# Patient Record
Sex: Female | Born: 1935 | Race: White | Hispanic: No | Smoking: Former smoker
Health system: Southern US, Community
[De-identification: ages and names within clinical notes are randomized; demographics above are authoritative.]

## PROBLEM LIST (undated history)

## (undated) DIAGNOSIS — C801 Malignant (primary) neoplasm, unspecified: Secondary | ICD-10-CM

## (undated) DIAGNOSIS — M1990 Unspecified osteoarthritis, unspecified site: Secondary | ICD-10-CM

## (undated) DIAGNOSIS — F32A Depression, unspecified: Secondary | ICD-10-CM

## (undated) DIAGNOSIS — E785 Hyperlipidemia, unspecified: Secondary | ICD-10-CM

## (undated) DIAGNOSIS — F419 Anxiety disorder, unspecified: Secondary | ICD-10-CM

## (undated) DIAGNOSIS — B019 Varicella without complication: Secondary | ICD-10-CM

## (undated) DIAGNOSIS — M8580 Other specified disorders of bone density and structure, unspecified site: Secondary | ICD-10-CM

## (undated) DIAGNOSIS — H409 Unspecified glaucoma: Secondary | ICD-10-CM

## (undated) DIAGNOSIS — I10 Essential (primary) hypertension: Secondary | ICD-10-CM

## (undated) DIAGNOSIS — F329 Major depressive disorder, single episode, unspecified: Secondary | ICD-10-CM

## (undated) HISTORY — PX: TONSILLECTOMY AND ADENOIDECTOMY: SUR1326

## (undated) HISTORY — PX: SPINE SURGERY: SHX786

## (undated) HISTORY — DX: Other specified disorders of bone density and structure, unspecified site: M85.80

## (undated) HISTORY — DX: Varicella without complication: B01.9

## (undated) HISTORY — DX: Major depressive disorder, single episode, unspecified: F32.9

## (undated) HISTORY — DX: Unspecified osteoarthritis, unspecified site: M19.90

## (undated) HISTORY — PX: BREAST SURGERY: SHX581

## (undated) HISTORY — PX: OTHER SURGICAL HISTORY: SHX169

## (undated) HISTORY — DX: Hyperlipidemia, unspecified: E78.5

## (undated) HISTORY — DX: Essential (primary) hypertension: I10

## (undated) HISTORY — DX: Depression, unspecified: F32.A

## (undated) HISTORY — DX: Malignant (primary) neoplasm, unspecified: C80.1

---

## 1997-10-01 ENCOUNTER — Ambulatory Visit (HOSPITAL_COMMUNITY): Admission: RE | Admit: 1997-10-01 | Discharge: 1997-10-01 | Payer: Self-pay | Admitting: Specialist

## 1998-05-06 ENCOUNTER — Other Ambulatory Visit: Admission: RE | Admit: 1998-05-06 | Discharge: 1998-05-06 | Payer: Self-pay | Admitting: Obstetrics and Gynecology

## 1999-05-21 ENCOUNTER — Other Ambulatory Visit: Admission: RE | Admit: 1999-05-21 | Discharge: 1999-05-21 | Payer: Self-pay | Admitting: Obstetrics and Gynecology

## 1999-09-02 ENCOUNTER — Ambulatory Visit (HOSPITAL_COMMUNITY): Admission: RE | Admit: 1999-09-02 | Discharge: 1999-09-02 | Payer: Self-pay | Admitting: Orthopedic Surgery

## 1999-09-02 ENCOUNTER — Encounter: Payer: Self-pay | Admitting: Orthopedic Surgery

## 1999-09-16 ENCOUNTER — Ambulatory Visit (HOSPITAL_COMMUNITY): Admission: RE | Admit: 1999-09-16 | Discharge: 1999-09-16 | Payer: Self-pay | Admitting: Orthopedic Surgery

## 1999-09-16 ENCOUNTER — Encounter: Payer: Self-pay | Admitting: Orthopedic Surgery

## 1999-09-30 ENCOUNTER — Encounter: Payer: Self-pay | Admitting: Orthopedic Surgery

## 1999-09-30 ENCOUNTER — Ambulatory Visit (HOSPITAL_COMMUNITY): Admission: RE | Admit: 1999-09-30 | Discharge: 1999-09-30 | Payer: Self-pay | Admitting: Orthopedic Surgery

## 2000-05-22 ENCOUNTER — Other Ambulatory Visit: Admission: RE | Admit: 2000-05-22 | Discharge: 2000-05-22 | Payer: Self-pay | Admitting: Obstetrics and Gynecology

## 2001-03-13 ENCOUNTER — Encounter: Payer: Self-pay | Admitting: Orthopaedic Surgery

## 2001-03-15 ENCOUNTER — Ambulatory Visit (HOSPITAL_COMMUNITY): Admission: RE | Admit: 2001-03-15 | Discharge: 2001-03-15 | Payer: Self-pay | Admitting: Orthopaedic Surgery

## 2001-03-15 ENCOUNTER — Encounter: Payer: Self-pay | Admitting: Orthopaedic Surgery

## 2001-03-19 ENCOUNTER — Inpatient Hospital Stay (HOSPITAL_COMMUNITY): Admission: RE | Admit: 2001-03-19 | Discharge: 2001-03-23 | Payer: Self-pay | Admitting: Orthopaedic Surgery

## 2001-03-19 ENCOUNTER — Encounter: Payer: Self-pay | Admitting: Orthopaedic Surgery

## 2001-05-23 ENCOUNTER — Other Ambulatory Visit: Admission: RE | Admit: 2001-05-23 | Discharge: 2001-05-23 | Payer: Self-pay | Admitting: Obstetrics and Gynecology

## 2002-07-26 ENCOUNTER — Other Ambulatory Visit: Admission: RE | Admit: 2002-07-26 | Discharge: 2002-07-26 | Payer: Self-pay | Admitting: Obstetrics and Gynecology

## 2002-11-22 ENCOUNTER — Ambulatory Visit (HOSPITAL_COMMUNITY): Admission: RE | Admit: 2002-11-22 | Discharge: 2002-11-22 | Payer: Self-pay | Admitting: Gastroenterology

## 2003-03-26 ENCOUNTER — Encounter: Admission: RE | Admit: 2003-03-26 | Discharge: 2003-03-26 | Payer: Self-pay | Admitting: Orthopedic Surgery

## 2003-04-18 ENCOUNTER — Observation Stay (HOSPITAL_COMMUNITY): Admission: RE | Admit: 2003-04-18 | Discharge: 2003-04-19 | Payer: Self-pay | Admitting: Orthopedic Surgery

## 2004-09-06 ENCOUNTER — Other Ambulatory Visit: Admission: RE | Admit: 2004-09-06 | Discharge: 2004-09-06 | Payer: Self-pay | Admitting: Obstetrics and Gynecology

## 2006-10-19 ENCOUNTER — Other Ambulatory Visit: Admission: RE | Admit: 2006-10-19 | Discharge: 2006-10-19 | Payer: Self-pay | Admitting: Obstetrics and Gynecology

## 2007-10-23 ENCOUNTER — Other Ambulatory Visit: Admission: RE | Admit: 2007-10-23 | Discharge: 2007-10-23 | Payer: Self-pay | Admitting: Obstetrics and Gynecology

## 2007-11-26 ENCOUNTER — Encounter: Admission: RE | Admit: 2007-11-26 | Discharge: 2007-11-26 | Payer: Self-pay | Admitting: General Surgery

## 2007-11-28 ENCOUNTER — Encounter (HOSPITAL_BASED_OUTPATIENT_CLINIC_OR_DEPARTMENT_OTHER): Payer: Self-pay | Admitting: General Surgery

## 2007-11-28 ENCOUNTER — Ambulatory Visit (HOSPITAL_BASED_OUTPATIENT_CLINIC_OR_DEPARTMENT_OTHER): Admission: RE | Admit: 2007-11-28 | Discharge: 2007-11-28 | Payer: Self-pay | Admitting: General Surgery

## 2007-12-19 ENCOUNTER — Ambulatory Visit: Payer: Self-pay | Admitting: Oncology

## 2008-01-08 ENCOUNTER — Ambulatory Visit: Admission: RE | Admit: 2008-01-08 | Discharge: 2008-01-15 | Payer: Self-pay | Admitting: Radiation Oncology

## 2008-01-09 LAB — CBC WITH DIFFERENTIAL/PLATELET
Basophils Absolute: 0 10*3/uL (ref 0.0–0.1)
EOS%: 0.8 % (ref 0.0–7.0)
HCT: 36.3 % (ref 34.8–46.6)
HGB: 12.4 g/dL (ref 11.6–15.9)
MCHC: 34.1 g/dL (ref 32.0–36.0)
MONO#: 0.5 10*3/uL (ref 0.1–0.9)
Platelets: 196 10*3/uL (ref 145–400)
RBC: 3.77 10*6/uL (ref 3.70–5.32)
RDW: 14 % (ref 11.3–14.5)
WBC: 8.2 10*3/uL (ref 3.9–10.0)
lymph#: 1.2 10*3/uL (ref 0.9–3.3)

## 2008-01-10 LAB — COMPREHENSIVE METABOLIC PANEL
BUN: 28 mg/dL — ABNORMAL HIGH (ref 6–23)
Sodium: 141 mEq/L (ref 135–145)

## 2008-05-07 ENCOUNTER — Ambulatory Visit: Payer: Self-pay | Admitting: Oncology

## 2008-05-09 LAB — CBC WITH DIFFERENTIAL/PLATELET
Basophils Absolute: 0 10*3/uL (ref 0.0–0.1)
Eosinophils Absolute: 0.1 10*3/uL (ref 0.0–0.5)
LYMPH%: 13.5 % — ABNORMAL LOW (ref 14.0–48.0)
MCV: 96.6 fL (ref 81.0–101.0)
NEUT%: 80.4 % — ABNORMAL HIGH (ref 39.6–76.8)
Platelets: 205 10*3/uL (ref 145–400)

## 2008-05-09 LAB — COMPREHENSIVE METABOLIC PANEL
ALT: 10 U/L (ref 0–35)
AST: 13 U/L (ref 0–37)
BUN: 25 mg/dL — ABNORMAL HIGH (ref 6–23)
CO2: 27 mEq/L (ref 19–32)
Calcium: 9 mg/dL (ref 8.4–10.5)
Chloride: 105 mEq/L (ref 96–112)
Creatinine, Ser: 1.04 mg/dL (ref 0.40–1.20)
Glucose, Bld: 125 mg/dL — ABNORMAL HIGH (ref 70–99)
Potassium: 4.3 mEq/L (ref 3.5–5.3)
Total Bilirubin: 0.5 mg/dL (ref 0.3–1.2)
Total Protein: 6.1 g/dL (ref 6.0–8.3)

## 2008-05-09 LAB — LACTATE DEHYDROGENASE: LDH: 151 U/L (ref 94–250)

## 2008-05-09 LAB — CANCER ANTIGEN 27.29: CA 27.29: 19 U/mL (ref 0–39)

## 2008-10-23 ENCOUNTER — Ambulatory Visit: Payer: Self-pay | Admitting: Obstetrics and Gynecology

## 2008-11-26 ENCOUNTER — Ambulatory Visit: Payer: Self-pay | Admitting: Oncology

## 2008-11-28 LAB — CBC WITH DIFFERENTIAL/PLATELET
Basophils Absolute: 0 10*3/uL (ref 0.0–0.1)
EOS%: 0.8 % (ref 0.0–7.0)
Eosinophils Absolute: 0.1 10*3/uL (ref 0.0–0.5)
HCT: 34.8 % (ref 34.8–46.6)
HGB: 11.8 g/dL (ref 11.6–15.9)
MCHC: 33.9 g/dL (ref 31.5–36.0)
MCV: 94.8 fL (ref 79.5–101.0)
MONO#: 0.5 10*3/uL (ref 0.1–0.9)
Platelets: 187 10*3/uL (ref 145–400)
WBC: 8.1 10*3/uL (ref 3.9–10.3)
lymph#: 1.4 10*3/uL (ref 0.9–3.3)

## 2008-12-01 LAB — COMPREHENSIVE METABOLIC PANEL
ALT: 15 U/L (ref 0–35)
AST: 17 U/L (ref 0–37)
Albumin: 3.9 g/dL (ref 3.5–5.2)
Creatinine, Ser: 1.12 mg/dL (ref 0.40–1.20)
Potassium: 4.5 mEq/L (ref 3.5–5.3)
Sodium: 142 mEq/L (ref 135–145)
Total Protein: 6 g/dL (ref 6.0–8.3)

## 2009-02-21 ENCOUNTER — Encounter: Admission: RE | Admit: 2009-02-21 | Discharge: 2009-02-21 | Payer: Self-pay | Admitting: Orthopedic Surgery

## 2009-04-22 ENCOUNTER — Ambulatory Visit (HOSPITAL_COMMUNITY): Admission: RE | Admit: 2009-04-22 | Discharge: 2009-04-23 | Payer: Self-pay | Admitting: Orthopedic Surgery

## 2009-05-02 HISTORY — PX: OTHER SURGICAL HISTORY: SHX169

## 2009-06-19 ENCOUNTER — Ambulatory Visit: Payer: Self-pay | Admitting: Oncology

## 2009-06-23 LAB — CANCER ANTIGEN 27.29: CA 27.29: 22 U/mL (ref 0–39)

## 2009-06-23 LAB — CBC WITH DIFFERENTIAL/PLATELET
BASO%: 0.3 % (ref 0.0–2.0)
EOS%: 1 % (ref 0.0–7.0)
Eosinophils Absolute: 0.1 10*3/uL (ref 0.0–0.5)
HCT: 35 % (ref 34.8–46.6)
LYMPH%: 19.9 % (ref 14.0–49.7)
MCV: 93.2 fL (ref 79.5–101.0)
RBC: 3.75 10*6/uL (ref 3.70–5.45)

## 2009-06-23 LAB — COMPREHENSIVE METABOLIC PANEL
AST: 12 U/L (ref 0–37)
Albumin: 4 g/dL (ref 3.5–5.2)
Alkaline Phosphatase: 52 U/L (ref 39–117)
Chloride: 104 mEq/L (ref 96–112)
Creatinine, Ser: 1.12 mg/dL (ref 0.40–1.20)
Total Bilirubin: 0.3 mg/dL (ref 0.3–1.2)

## 2009-06-23 LAB — LACTATE DEHYDROGENASE: LDH: 136 U/L (ref 94–250)

## 2009-06-23 LAB — VITAMIN D 25 HYDROXY (VIT D DEFICIENCY, FRACTURES): Vit D, 25-Hydroxy: 35 ng/mL (ref 30–89)

## 2009-08-14 ENCOUNTER — Encounter: Admission: RE | Admit: 2009-08-14 | Discharge: 2009-08-14 | Payer: Self-pay | Admitting: Internal Medicine

## 2009-11-05 ENCOUNTER — Ambulatory Visit: Payer: Self-pay | Admitting: Obstetrics and Gynecology

## 2009-11-05 ENCOUNTER — Other Ambulatory Visit: Admission: RE | Admit: 2009-11-05 | Discharge: 2009-11-05 | Payer: Self-pay | Admitting: Obstetrics and Gynecology

## 2009-12-23 ENCOUNTER — Ambulatory Visit: Payer: Self-pay | Admitting: Oncology

## 2009-12-25 LAB — CBC WITH DIFFERENTIAL/PLATELET
BASO%: 0.3 % (ref 0.0–2.0)
Eosinophils Absolute: 0.1 10*3/uL (ref 0.0–0.5)
HGB: 11.4 g/dL — ABNORMAL LOW (ref 11.6–15.9)
LYMPH%: 22.6 % (ref 14.0–49.7)
MCHC: 32.7 g/dL (ref 31.5–36.0)
MCV: 91.4 fL (ref 79.5–101.0)
MONO#: 0.5 10*3/uL (ref 0.1–0.9)
MONO%: 5.8 % (ref 0.0–14.0)
NEUT#: 5.6 10*3/uL (ref 1.5–6.5)
Platelets: 203 10*3/uL (ref 145–400)
RDW: 13.7 % (ref 11.2–14.5)

## 2009-12-25 LAB — COMPREHENSIVE METABOLIC PANEL
ALT: 19 U/L (ref 0–35)
AST: 18 U/L (ref 0–37)
Albumin: 4.1 g/dL (ref 3.5–5.2)
Alkaline Phosphatase: 66 U/L (ref 39–117)
BUN: 30 mg/dL — ABNORMAL HIGH (ref 6–23)
CO2: 27 mEq/L (ref 19–32)
Creatinine, Ser: 1.01 mg/dL (ref 0.40–1.20)
Glucose, Bld: 98 mg/dL (ref 70–99)
Potassium: 4.7 mEq/L (ref 3.5–5.3)
Sodium: 140 mEq/L (ref 135–145)

## 2009-12-25 LAB — CANCER ANTIGEN 27.29: CA 27.29: 15 U/mL (ref 0–39)

## 2009-12-25 LAB — VITAMIN D 25 HYDROXY (VIT D DEFICIENCY, FRACTURES): Vit D, 25-Hydroxy: 42 ng/mL (ref 30–89)

## 2009-12-25 LAB — LACTATE DEHYDROGENASE: LDH: 140 U/L (ref 94–250)

## 2010-08-02 LAB — CBC
MCV: 95.8 fL (ref 78.0–100.0)
RDW: 13.1 % (ref 11.5–15.5)
WBC: 9.1 10*3/uL (ref 4.0–10.5)

## 2010-08-02 LAB — BASIC METABOLIC PANEL
Creatinine, Ser: 0.98 mg/dL (ref 0.4–1.2)
GFR calc Af Amer: >60 mL/min (ref >60)
GFR calc non Af Amer: 54 mL/min — ABNORMAL LOW (ref >60)
Potassium: 4.5 mEq/L (ref 3.5–5.1)

## 2010-08-25 ENCOUNTER — Encounter (HOSPITAL_BASED_OUTPATIENT_CLINIC_OR_DEPARTMENT_OTHER): Payer: Medicare Other | Admitting: Oncology

## 2010-08-25 ENCOUNTER — Other Ambulatory Visit: Payer: Self-pay | Admitting: Oncology

## 2010-08-25 DIAGNOSIS — C50919 Malignant neoplasm of unspecified site of unspecified female breast: Secondary | ICD-10-CM

## 2010-08-25 DIAGNOSIS — I10 Essential (primary) hypertension: Secondary | ICD-10-CM

## 2010-08-25 DIAGNOSIS — C50419 Malignant neoplasm of upper-outer quadrant of unspecified female breast: Secondary | ICD-10-CM

## 2010-08-25 DIAGNOSIS — Z170 Estrogen receptor positive status [ER+]: Secondary | ICD-10-CM

## 2010-08-25 DIAGNOSIS — M810 Age-related osteoporosis without current pathological fracture: Secondary | ICD-10-CM

## 2010-08-25 LAB — CBC WITH DIFFERENTIAL/PLATELET
Eosinophils Absolute: 0.1 10*3/uL (ref 0.0–0.5)
HCT: 35.5 % (ref 34.8–46.6)
LYMPH%: 19.5 % (ref 14.0–49.7)
MCH: 30.7 pg (ref 25.1–34.0)
MCHC: 33.4 g/dL (ref 31.5–36.0)
MCV: 91.8 fL (ref 79.5–101.0)
MONO#: 0.5 10*3/uL (ref 0.1–0.9)
MONO%: 7.1 % (ref 0.0–14.0)
Platelets: 179 10*3/uL (ref 145–400)
RDW: 14.4 % (ref 11.2–14.5)

## 2010-08-26 LAB — COMPREHENSIVE METABOLIC PANEL
Albumin: 4 g/dL (ref 3.5–5.2)
BUN: 37 mg/dL — ABNORMAL HIGH (ref 6–23)
Creatinine, Ser: 1.11 mg/dL (ref 0.40–1.20)
Potassium: 4.8 mEq/L (ref 3.5–5.3)

## 2010-08-26 LAB — VITAMIN D 25 HYDROXY (VIT D DEFICIENCY, FRACTURES): Vit D, 25-Hydroxy: 53 ng/mL (ref 30–89)

## 2010-09-14 NOTE — Op Note (Signed)
Stephanie Dickerson, Stephanie Dickerson           ACCOUNT NO.:  [id number]   MEDICAL RECORD NO.:  [id number]          PATIENT TYPE:  AMB   LOCATION:  DSC                          FACILITY:  [facility]   PHYSICIAN:  Leonie Man, M.D.   DATE OF BIRTH:  11-Jun-1923   DATE OF PROCEDURE:  11/28/2007  DATE OF DISCHARGE:                               OPERATIVE REPORT   PREOPERATIVE DIAGNOSIS:  Carcinoma of the right breast.   POSTOPERATIVE DIAGNOSIS:  Carcinoma of the right breast.   PROCEDURE:  1. Blue dye injection.  2. Right-sided lumpectomy.  3. Right-sided sentinel lymph node biopsy.   SURGEON:  Leonie Man, MD   ASSISTANT:  OR technician.   ANESTHESIA:  General.   SPECIMENS:  1. Right-sided breast tissue for specimen mammography.  2. Sentinel lymph nodes x3.   FINDINGS:  Lymph nodes negative for metastases and the right breast  tissue forwarded was adequate for having contained the clip and the  localizing wire.   Stephanie Dickerson is an 77 year old lady who on screening mammography was  noted to have a lesion in the right breast which on core biopsy shows  invasive carcinoma.  She comes now to the operating room after the risks  and potential benefits of surgery have been discussed.  She has agreed  to lumpectomy and sentinel lymph node biopsy with the possibility of  axillary lymph node dissection.   Following induction of satisfactory anesthesia, I injected approximately  4 mL of dilated methylene blue onto this areolar region and massaged  this into the lymphatics.  The breast was then prepped and draped to be  included in a sterile operative field.  Positive identification of the  patient as Stephanie Dickerson and the operation to be done as lumpectomy  with sentinel lymph node biopsy, possibility of axillary lymph node  dissection was carried out.  All further safety requisitions by  identifying the right side of the patient was done.   The lump in the right breast was approached  through an upper outer  quadrant incision deepened through the skin and subcutaneous tissues and  following the localizing needle down to the chest wall, the entire area  of breast tissue was removed.  Specimen mammography showed both the clip  and pin to be in good position within the center of the specimen.  The  incision was then checked for hemostasis and packed with two cotton  gauze sponges.  I then turned the attention to the axilla.  With the use  of the NeoProbe, we found the most active radioactive side and made a  nice incision there deepening this through the skin and subcutaneous  tissue using the NeoProbe to guide [place] to the region of these sentinel  lymph nodes in the axilla.  The 3 nodes were formed but none of these  were colored blue.  All were forwarded for pathologic diagnosis and felt  that they were benign.   I then reinspected the breast wound.  It was seen to be dry.  Sponge and  instrument counts were verified and the breast wound was closed in  layers using 2-0  Vicryl, 3-0 Vicryl, and 5-0 Monocryl on the skin.  Attention now turned to the axillary wound.  This was inspected and seen  to be dry and this was closed with interrupted 3-0 Vicryl sutures.  The  skin was then closed with 5-0 Monocryl.  Sterile dressings were applied.  The anesthetic reversed and the patient removed from the operating room  to the recovery room in stable condition.  He tolerated the procedure  well.      Leonie Man, M.D.  Electronically Signed     PB/MEDQ  D:  11/28/2007  T:  11/29/2007  Job:  [id number]

## 2010-09-17 NOTE — H&P (Signed)
[facility]  Patient:    Stephanie Dickerson, Stephanie Dickerson. Visit Number: [id number] MRN: [id number]          Service Type: Attending:  Patricia Nettle, M.D. Dictated by:   Druscilla Brownie. Underwood III, P.A.-C. Adm. Date:  03/19/01   CC:         Barbette Hair. Vaughan Basta., M.D. at [facility], [address], [address] [place], [place]  [address]   History and Physical  DATE OF BIRTH:  12-Aug-1923  CHIEF COMPLAINT:  Pain in my back and right leg.  HISTORY OF PRESENT ILLNESS:  This is a 76 year old lady who had been seen by [place] for continuing progressive problems concerning her low back and right lower extremity.  This had been quite disabling.  She has extreme difficulty getting about due to this discomfort.  She has had to use Duragesic patch 75 mcg for her discomfort.  Examination reveals difficulty in heel walking.  She has 4+/5 strength in the right EHL.  Sensation is intact.  She has a positive straight leg raising on the right with transmission to right buttock pain. MRI on February 01, 2001, showed a grade 1 spondylolisthesis with foraminal stenosis at that level.  The 4/5 level is the main area of discopathy and pathology.  Due to the fact that strong medication is needed for her overall difficulty, and the fact that she is not responding to conservative care, felt she would benefit from surgical intervention, and is admitted for L4-5 laminectomy with L4-5 posterior spinal fusion with pedicle screws.  Cell Saver will be used.  She was to have donated 2 units of autologous blood, however, when they tried to draw the blood, it began to clot, and they were unable to get both units.  She has no blood available.  She has cleared preoperatively by Dr. Ike Bene of [facility] at [facility] for this surgery.  After surgery, she will probably go home with home physical therapy, nurse, and/or bath aide.  PAST MEDICAL  HISTORY:  This lady has really been in good health throughout her life time.  She does have hyperlipidemia and she is lactose intolerant.  PAST SURGICAL HISTORY:  Have been acute mastoiditis surgery in 1933.  She has had three vaginal pregnancies.  ALLERGIES:  No medical allergies, but she is lactose intolerant.  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  This is being treated by Dr. Londell Moh.  SOCIAL HISTORY:  The patient is married, neither smokes nor drinks.  FAMILY HISTORY:  Positive for bone cancer in her mother and leukemia in her father.  REVIEW OF SYSTEMS:  CNS:  No seizures, _______ paralysis, numbness, or double vision.  RESPIRATORY:  No productive cough.  No hemoptysis.  No shortness of breath.  CARDIOVASCULAR:  No chest pain.  No angina and no orthopnea. GASTROINTESTINAL:  No nausea, vomiting, melena, or bloody stools.  He has occasional dyspepsia with certain foods.  GENITOURINARY:  No discharge, dysuria, or hematuria.  MUSCULOSKELETAL:  Primarily in Present Illness as mentioned above.  PHYSICAL EXAMINATION:  GENERAL:  Alert, cooperative, and friendly 77 year old, somewhat apprehensive and anxious white female,  and she is accompanied by her husband.  VITAL SIGNS:  Blood pressure 150/70, pulse 72, respirations 12.  HEENT:  Normocephalic.  PERRLA.  Oropharynx is clear.  CHEST:  Clear to auscultation.  No rhonchi and no rales.  HEART:  Regular rate and rhythm.  No murmurs are heard.  ABDOMEN:  Soft and nontender, obese.  Liver and spleen not felt.  GENITALIA:  Not done and not pertinent to Present Illness.  RECTAL:  Not done and not pertinent to Present Illness.  PELVIC:  Not done and not  pertinent to Present Illness.  BREASTS:  Not done and not pertinent to Present Illness.  EXTREMITIES:  As in Present Illness above.  ADMITTING DIAGNOSES: 1. L4 degenerative spondylolisthesis with spinal stenosis. 2. Hyperlipidemia.  PLAN:  The patient will undergo L4-5 laminectomy with posterior spinal fusion and with pedicle screws.  We mentioned the rehabilitation program.  She certainly could be a candidate for this depending her postoperative activities and recovery.  Otherwise, we plan home physical therapy.  Should we have any medical problems, we will certainly contact Dr. Merril Abbe to follow along with [place]. Dictated by:   Druscilla Brownie. Underwood III, P.A.-C. Attending:  Patricia Nettle, M.D. DD:  03/13/01 TD:  03/14/01 Job: [id number]

## 2010-09-17 NOTE — Discharge Summary (Signed)
[facility]  Patient:    Stephanie Dickerson, Stephanie Dickerson Visit Number: [id number] MRN: [id number]          Service Type: SUR Location: 4W 0447 01 Attending Physician:  Patricia Nettle Dictated by:   Ralene Bathe, P.A.-C. Admit Date:  03/19/2001 Discharge Date: 03/23/2001                             Discharge Summary  ADMITTING DIAGNOSES: 1. Spondylolisthesis and spinal stenosis lumbar vertebrae-4. 2. Hyperlipidemia. 3. Anxiety disorder. 4. Chronic pain.  DISCHARGE DIAGNOSES: 1. Spondylolisthesis and spinal stenosis lumbar vertebrae-4. 2. Hyperlipidemia. 3. Anxiety disorder. 4. Chronic pain. 5. Status post lumbar vertebrae-3 through lumbar vertebrae-5 laminectomy and    posterior spinal fusion with pedicle screws at lumbar vertebrae-4 and    lumbar vertebrae-5. 6. Postoperative hemorrhagic anemia requiring transfusion without sequelae. 7. Postoperative exacerbation of anxiety as she was not on her appropriate    medications.  BRIEF HISTORY:  Ms. Cogan is a 77 year old female well known to our practice.  She has spondylolisthesis at L4 as well as spinal stenosis and has had significant pain especially effecting the left L4 nerve root.  Findings is she has failed to require posterior lateral fusion.  Risks and benefits were discussed with the patient at length and she wished to proceed.  She had been on chronic pain medicines including Duragesic patch preoperatively just for control of pain.  The patient wished to proceed.  HOSPITAL COURSE:  The patient underwent the above-mentioned procedure and tolerated this well.  All appropriate IV antibiotics and analgesics were utilized.  Postoperatively, she did experience hemorrhagic anemia requiring transfusion with good result.  She did have good relief of her leg pain.  Pain control was a little bit of an issue initially due to her frequent narcotic use preoperatively; however, we began having good  results and replaced back on her Duragesic patch along with her Ultram prior to her discharge with good pain control.  The patient did unfortunately on postoperative day two and three have an anxiety attack as her medications had not been reordered per her schedule. This was adjusted and she had good control at that time.  A LSO brace was ordered postoperatively.  She was placed in.  PT and OT were consulted.  Her GI status progressed nicely.  By date April 22, 2001, postoperative day four, she had met all of her discharge goals.  She was afebrile, vital signs were stable, and neurovascularly she was intact.  She was controlled on her Duragesic and Ultram.  At this time, she was stable for discharge to home.  LABORATORY DATA:  Admission hemoglobin of 12.0, postoperatively down to 8.0 and 7.0, and after transfusion 8.6 and then at 10.6.  Chemistries also normal. Blood type of A positive.  Three units found transfused in the chart.  X-rays showed a questionable hilar adenopathy.  Intraoperatively, posterior pedicle screws were noted to be in satisfactory appearance.  I did not see EKG on the time of dictation.  CONDITION ON DISCHARGE:  Stable and improved.  DISCHARGE MEDICATIONS:  As planned.  DISCHARGE INSTRUCTIONS:  The patient is being discharged to home with back precautions.  Wear the LSO when up at all times.  Daily dressing change.  May shower.  Resume her home medications which she has Duragesic and Ultram at home.  Do not resume Celebrex.  Use her stool softener p.r.n.  Call for two-week follow-up.  Call for time.  PT and OT at home.  Resume all other home medications. Dictated by:   Ralene Bathe, P.A.-C. Attending Physician:  Patricia Nettle. DD:  04/23/01 TD:  04/24/01 Job: [id number]

## 2010-09-17 NOTE — Op Note (Signed)
[facility]  Patient:    Stephanie Dickerson, Stephanie Dickerson Visit Number: [id number] MRN: [id number]          Service Type: SUR Location: 4W 0447 01 Attending Physician:  Patricia Nettle Proc. Date: 03/19/01 Admit Date:  03/19/2001                             Operative Report  PREOPERATIVE DIAGNOSES: 1. Degenerative spondylolithesis L4-5. 2. Spinal stenosis. 3. Right lower extremity radiculopathy.  POSTOPERATIVE DIAGNOSES: 1. Degenerative spondylolithesis L4-5. 2. Spinal stenosis. 3. Right lower extremity radiculopathy.  PROCEDURE PERFORMED: 1. Decompressive laminectomy L3-4, L4-5, and L5-S1 with lateral    recess and foraminal decompression. 2. Posterolateral fusion L3-4, L4-5, and L5-S1. 3. Pedicle screw instrumentation L4-5 using Stryker via pedicle    screw system. 4. Autogenous local bone graft. 5. Allograft bone graft.  SURGEON:  Patricia Nettle, M.D.  ASSISTANT:  Javier Docker, M.D.  ANESTHESIA:  General endotracheal.  COMPLICATIONS:  None.  INDICATION:  The patient is a 77 year old female with a 1+ year of disabling lower extremity pain.  She has right greater than left leg symptoms.  She is unable to walk more than a block.  She has failed conservative modalities including anti-inflammatories, epidural steroid injections, and physical therapy.  Plain x-rays showed a degenerative L4-5 grade I spondylolisthesis with multilevel spondylitic changes.  An MRI scan was consistent with spinal stenosis most significant at the L4-5 level with right greater than left foraminal stenosis.  The patient has been on a Duragesic 75 mcg as well as Ultram and Celebrex for her pain.  DESCRIPTION OF PROCEDURE:  The patient was properly identified as Stephanie Dickerson in the holding area and taken to the operating room.  She underwent general endotracheal anesthesia without difficulty.  She was given prophylactic IV antibiotics.  She was turned prone onto the  acromed four posterior frame.  All bony prominences were padded.  The back was prepped and draped in the usual sterile fashion.  An 8 cm incision was made from L3 to the sacrum in the midline.  This was carried down to the fascia.  The fascia was incised and the paraspinal muscles stripped out to the tips of the transverse processes.  The sacral ala was exposed.  All soft tissue was cleaned from the spinous processes of L3, 4, 5, and S1.  The spinous processes were removed and morcellized for bone graft.  At this point, a central laminectomy was performed using the Leksell rongeur and Kerrison punches.  There was hypertrophied ligamentum flavum as well as overgrowth of the facet joints most significantly at the L4-5 level.  A lateral recess and foraminal decompression was carried out bilaterally.  There was severe compression of the L4 and 5 nerve root on the right side.  The L4-5 foramen was severely stenotic and a partial facetectomy as well as foraminotomy was required to decompress the exiting L4 nerve root.  The L3, 4, 5, and S1 roots were palpated using a probe bilaterally.  After adequate decompression had been obtained, pedicle screw instrumentation was placed at the L4 and 5 levels using an anatomic pedicle probing technique.  The awl followed by the Steffe pedicle probe followed by the tap and the ball tip probe were utilized sequentially.  On the left side, 40 x 6.5 screws were placed and on the left side 40 x 5.5 mm screws were placed.  The pedicles were palpated from  within the canal and were intact after placement of the screws.  An intraoperative x-ray was taken to confirm appropriate level and placement of the pedicle screws.  At this point, a bilateral posterolateral fusion was carried out by decording the transverse processes and lateral border of the facet and pars interarticularis L3, 4, 5, and the sacrum.  Autogenous local bone graft was packed into the lateral gutters  bilaterally.  Allograft chips were then packed on top of the autogenous graft.  Rods were placed bilaterally and appropriate locking caps inserted.  Gentle distraction was applied across the rods and final tightening was carried out.  The nerve roots were once again palpated.  Hemostasis was achieved.  Gelfoam was placed over the exposed dura.  A deep drain was placed. The deep fascia was closed using #1 running Vicryl sutures.  O Vicryl and 2-0 Vicryl were utilized to close the subcutaneous tissues.  Staples were used to close the skin.  A sterile dressing was applied.  The patient was turned supine and extubated without difficulty.  She was transferred to the recovery room in stable condition able to wiggle her toes. Attending Physician:  Patricia Nettle. DD:  03/19/01 TD:  03/20/01 Job: [id number]

## 2010-09-17 NOTE — Op Note (Signed)
NAME:  Stephanie Dickerson, Stephanie Dickerson                     ACCOUNT NO.:  [id number]   MEDICAL RECORD NO.:  [id number]                   PATIENT TYPE:  OBV   LOCATION:  0474                                 FACILITY:  [facility]   PHYSICIAN:  Marlowe Kays, M.D.               DATE OF BIRTH:  Dec 02, 1923   DATE OF PROCEDURE:  04/18/2003  DATE OF DISCHARGE:                                 OPERATIVE REPORT   PREOPERATIVE DIAGNOSES:  1. Torn lateral meniscus.  2. Osteoarthritis, left knee.   POSTOPERATIVE DIAGNOSES:  1. Torn medial and lateral menisci.  2. Osteoarthritis, left knee.   OPERATION:  Left knee arthroscopy with (1) partial medial and lateral  meniscectomy, (2) shaving of medial femoral condyle.   SURGEON:  Illene Labrador. Aplington, M.D.   ASSISTANT:  Nurse.   ANESTHESIA:  General.   PATHOLOGY AND JUSTIFICATION FOR PROCEDURE:  She has had progressive pain,  swelling in the left knee.  MRI has demonstrated severe tear of the lateral  meniscus associated with degenerative arthritis and also the possibility of  loose bodies in the posterior joint, medial and lateral to the PCL.  At  surgery, she did have some fairly significant maceration of the anterior and  mid portion of the medial meniscus as well as serrated small tears all the  way back to the posterior curve and grade 2/4 chondromalacia of the medial  femoral condyle.  She had complete loss of articular cartilage on both the  lateral femoral condyle and lateral tibial plateau.  It was explained to her  preoperatively that the surgery was not designed to improve her arthritic  problems but hopefully, her pain would be significantly relieved by excision  of the torn lateral meniscus.   DESCRIPTION OF PROCEDURE:  Satisfactory general anesthesia, pneumatic  tourniquet, thigh stabilizer.  Left knee was prepped with DuraPrep and  draped in a sterile field.  Superior and medial saline inflow.  First  through an anterolateral portal, the  medial compartment of the knee joint  was evaluated.  She had a good bit of synovitis which I resected with the  3.5 shaver.  The macerated and torn medial meniscus I dissected off was  smoothed down, and I shaved down also the medial femoral condyle.  Looking  up in the medial gutter and suprapatellar area, there was some mild wear of  the patella but nothing significant.  I then reversed portals.  Her lateral  meniscus was significantly torn throughout its entire extent associated with  the complete loss of the articular cartilage discussed above.  I resected  the torn portion of the inferior horn with scissors, followed by shaving out  with 3.5 shaver.  Posteriorly, the major tear was the intercondylar area  which I debrided back with baskets.  I also debrided back the remaining rim  and shaved everything down with 3.5 shaver until smooth.  I then irrigated  the knee joint until clear  and all fluid possible removed.  The two anterior  portals were closed with 4-0 nylon, and 20 mL of 0.5% Marcaine with  adrenaline and 4 mg of morphine were then instilled through the inflow  apparatus which was removed and this  portal closed with 4-0 nylon as well.  Betadine, Adaptic dry sterile  dressing were applied.  Tourniquet was released.  At the time of this  dictation, she was on her way to the recovery room in satisfactory condition  with no known complications.                                               Marlowe Kays, M.D.    JA/MEDQ  D:  04/18/2003  T:  04/18/2003  Job:  [id number]

## 2010-11-08 ENCOUNTER — Encounter: Payer: Medicare Other | Admitting: Obstetrics and Gynecology

## 2010-11-11 ENCOUNTER — Encounter (INDEPENDENT_AMBULATORY_CARE_PROVIDER_SITE_OTHER): Payer: Medicare Other | Admitting: Obstetrics and Gynecology

## 2010-11-11 DIAGNOSIS — R82998 Other abnormal findings in urine: Secondary | ICD-10-CM

## 2010-11-11 DIAGNOSIS — M899 Disorder of bone, unspecified: Secondary | ICD-10-CM

## 2010-11-11 DIAGNOSIS — N952 Postmenopausal atrophic vaginitis: Secondary | ICD-10-CM

## 2010-11-11 DIAGNOSIS — C50919 Malignant neoplasm of unspecified site of unspecified female breast: Secondary | ICD-10-CM

## 2010-11-23 ENCOUNTER — Encounter: Payer: Self-pay | Admitting: Obstetrics and Gynecology

## 2010-12-08 ENCOUNTER — Telehealth: Payer: Self-pay

## 2010-12-08 MED ORDER — ALENDRONATE SODIUM 70 MG PO TABS: 70.0000 mg | ORAL_TABLET | ORAL | Status: DC

## 2010-12-08 NOTE — Telephone Encounter (Signed)
SEE AEX NOTE 11/11/10. YOU TOLD HER TO CALL YOU BACK AFTER YOU WENT TO YOUR RECENT CONFERENCE.

## 2010-12-08 NOTE — Telephone Encounter (Signed)
Chart please 

## 2010-12-08 NOTE — Telephone Encounter (Signed)
PT. NOTIFIED AND NEEDS RX SENT TO PHARMACY. I TOLD PT I WOULD TAKE CARE OF IT.

## 2010-12-08 NOTE — Telephone Encounter (Signed)
Tell patient based on information at the conference I think she should stay on her Fosamax.

## 2011-01-28 LAB — DIFFERENTIAL
Basophils Absolute: 0
Basophils Relative: 0
Lymphocytes Relative: 15
Monocytes Absolute: 0.4
Monocytes Relative: 5
Neutro Abs: 6.1
Neutrophils Relative %: 78 — ABNORMAL HIGH

## 2011-01-28 LAB — CBC
HCT: 39.7
Hemoglobin: 13
MCV: 96.6
Platelets: 193
RDW: 14.6

## 2011-01-28 LAB — URINALYSIS, ROUTINE W REFLEX MICROSCOPIC
Bilirubin Urine: NEGATIVE
Nitrite: NEGATIVE
Specific Gravity, Urine: 1.011
Urobilinogen, UA: 0.2
pH: 7

## 2011-01-28 LAB — COMPREHENSIVE METABOLIC PANEL
Albumin: 3.8
BUN: 20
Creatinine, Ser: 1.09
Glucose, Bld: 158 — ABNORMAL HIGH
Total Bilirubin: 0.9
Total Protein: 6.2

## 2011-01-28 LAB — URINE MICROSCOPIC-ADD ON

## 2011-03-22 ENCOUNTER — Other Ambulatory Visit: Payer: Self-pay | Admitting: Oncology

## 2011-03-22 ENCOUNTER — Other Ambulatory Visit (HOSPITAL_BASED_OUTPATIENT_CLINIC_OR_DEPARTMENT_OTHER): Payer: Medicare Other

## 2011-03-22 DIAGNOSIS — Z170 Estrogen receptor positive status [ER+]: Secondary | ICD-10-CM

## 2011-03-22 DIAGNOSIS — M810 Age-related osteoporosis without current pathological fracture: Secondary | ICD-10-CM

## 2011-03-22 DIAGNOSIS — M1990 Unspecified osteoarthritis, unspecified site: Secondary | ICD-10-CM

## 2011-03-22 DIAGNOSIS — C50919 Malignant neoplasm of unspecified site of unspecified female breast: Secondary | ICD-10-CM

## 2011-03-22 LAB — CBC WITH DIFFERENTIAL/PLATELET
BASO%: 0.4 % (ref 0.0–2.0)
EOS%: 0.8 % (ref 0.0–7.0)
Eosinophils Absolute: 0.1 10*3/uL (ref 0.0–0.5)
LYMPH%: 18.7 % (ref 14.0–49.7)
MCH: 30.9 pg (ref 25.1–34.0)
MCHC: 33.6 g/dL (ref 31.5–36.0)
MCV: 91.7 fL (ref 79.5–101.0)
MONO%: 6.4 % (ref 0.0–14.0)
NEUT#: 5.6 10*3/uL (ref 1.5–6.5)
Platelets: 198 10*3/uL (ref 145–400)
RBC: 3.92 10*6/uL (ref 3.70–5.45)
RDW: 14.3 % (ref 11.2–14.5)

## 2011-03-23 LAB — COMPREHENSIVE METABOLIC PANEL
AST: 20 U/L (ref 0–37)
Albumin: 4 g/dL (ref 3.5–5.2)
Alkaline Phosphatase: 72 U/L (ref 39–117)
Glucose, Bld: 101 mg/dL — ABNORMAL HIGH (ref 70–99)
Potassium: 4.1 mEq/L (ref 3.5–5.3)
Sodium: 141 mEq/L (ref 135–145)
Total Bilirubin: 0.5 mg/dL (ref 0.3–1.2)
Total Protein: 5.9 g/dL — ABNORMAL LOW (ref 6.0–8.3)

## 2011-03-29 ENCOUNTER — Ambulatory Visit (HOSPITAL_BASED_OUTPATIENT_CLINIC_OR_DEPARTMENT_OTHER): Payer: Medicare Other | Admitting: Oncology

## 2011-03-29 DIAGNOSIS — C50419 Malignant neoplasm of upper-outer quadrant of unspecified female breast: Secondary | ICD-10-CM

## 2011-03-29 DIAGNOSIS — Z170 Estrogen receptor positive status [ER+]: Secondary | ICD-10-CM

## 2011-03-29 DIAGNOSIS — E559 Vitamin D deficiency, unspecified: Secondary | ICD-10-CM

## 2011-03-29 DIAGNOSIS — C50919 Malignant neoplasm of unspecified site of unspecified female breast: Secondary | ICD-10-CM

## 2011-03-29 NOTE — Progress Notes (Signed)
Hematology and Oncology Follow Up Visit  Stephanie Dickerson [id number] May 11, 1923 77 y.o. 03/29/2011 1:02 PM   Principle Diagnosis: Stephanie Dickerson 76 year old woman history of node-negative ER/PR positive breast cancer postlumpectomy July 2009, currently on anastrozole.  Interim History:  Stephanie Dickerson is doing well. She has no complaints. His she had her last mammogram in July. His follow medication well. There have been no other changes in her meds. She had a good holiday season.  Medications: I have reviewed the patient's current medications.  Allergies: No Known Allergies  Past Medical History, Surgical history, Social history, and Family History were reviewed and updated.  Review of Systems: Constitutional:  Negative for fever, chills, night sweats, anorexia, weight loss, pain. Cardiovascular: no chest pain or dyspnea on exertion Respiratory: no cough, shortness of breath, or wheezing Neurological: no TIA or stroke symptoms Dermatological: negative ENT: negative Skin Gastrointestinal: no abdominal pain, change in bowel habits, or black or bloody stools Genito-Urinary: no dysuria, trouble voiding, or hematuria Hematological and Lymphatic: negative Breast: negative for breast lumps Musculoskeletal: negative Remaining ROS negative.  Physical Exam: Blood pressure 122/74, pulse 89, temperature 97.8 F (36.6 C), height 5\' 4"  (1.626 m), weight 189 lb 14.4 oz (86.138 kg). ECOG: 0 General appearance: alert, cooperative, appears stated age and no distress Head: Normocephalic, without obvious abnormality, atraumatic Neck: no adenopathy, no carotid bruit, no JVD, supple, symmetrical, trachea midline and thyroid not enlarged, symmetric, no tenderness/mass/nodules Lymph nodes: Cervical, supraclavicular, and axillary nodes normal. Cardiac : Normal heart sound Pulmonary: Normal breath sounds Breasts: Right and left breasts are free of any masses. There is no nipple retraction or skin  changes. Both of the axilla examined were normal. Abdomen: Benign  Extremities  normal Neuro: Normal  Lab Results: Lab Results  Component Value Date   WBC 7.6 03/22/2011   HGB 12.1 03/22/2011   HCT 35.9 03/22/2011   MCV 91.7 03/22/2011   PLT 198 03/22/2011     Chemistry      Component Value Date/Time   NA 141 03/22/2011 1319   NA 141 03/22/2011 1319   NA 141 03/22/2011 1319   K 4.1 03/22/2011 1319   K 4.1 03/22/2011 1319   K 4.1 03/22/2011 1319   CL 103 03/22/2011 1319   CL 103 03/22/2011 1319   CL 103 03/22/2011 1319   CO2 28 03/22/2011 1319   CO2 28 03/22/2011 1319   CO2 28 03/22/2011 1319   BUN 33* 03/22/2011 1319   BUN 33* 03/22/2011 1319   BUN 33* 03/22/2011 1319   CREATININE 1.23* 03/22/2011 1319   CREATININE 1.23* 03/22/2011 1319   CREATININE 1.23* 03/22/2011 1319      Component Value Date/Time   CALCIUM 9.9 03/22/2011 1319   CALCIUM 9.9 03/22/2011 1319   CALCIUM 9.9 03/22/2011 1319   ALKPHOS 72 03/22/2011 1319   ALKPHOS 72 03/22/2011 1319   ALKPHOS 72 03/22/2011 1319   AST 20 03/22/2011 1319   AST 20 03/22/2011 1319   AST 20 03/22/2011 1319   ALT 14 03/22/2011 1319   ALT 14 03/22/2011 1319   ALT 14 03/22/2011 1319   BILITOT 0.5 03/22/2011 1319   BILITOT 0.5 03/22/2011 1319   BILITOT 0.5 03/22/2011 1319       Radiological Studies: chest X-ray n/a Mammogram n/a Bone density n/a  Impression and Plan: 77 year old woman with history of node-negative hormone receptor positive breast cancer on Arimidex she is doing well without complaints and will be seen in 6 months time with appropriate imaging studies.  More than 50% of the visit was spent in patient-related counselling   Pierce Crane, MD 11/27/20121:02 PM

## 2011-03-30 ENCOUNTER — Telehealth: Payer: Self-pay | Admitting: *Deleted

## 2011-03-30 NOTE — Telephone Encounter (Signed)
gave patient appointment for 08-2011 

## 2011-05-06 DIAGNOSIS — M1710 Unilateral primary osteoarthritis, unspecified knee: Secondary | ICD-10-CM | POA: Diagnosis not present

## 2011-05-06 DIAGNOSIS — M6530 Trigger finger, unspecified finger: Secondary | ICD-10-CM | POA: Diagnosis not present

## 2011-05-06 DIAGNOSIS — M65849 Other synovitis and tenosynovitis, unspecified hand: Secondary | ICD-10-CM | POA: Diagnosis not present

## 2011-05-06 DIAGNOSIS — M79609 Pain in unspecified limb: Secondary | ICD-10-CM | POA: Diagnosis not present

## 2011-05-10 DIAGNOSIS — R634 Abnormal weight loss: Secondary | ICD-10-CM | POA: Diagnosis not present

## 2011-05-10 DIAGNOSIS — M949 Disorder of cartilage, unspecified: Secondary | ICD-10-CM | POA: Diagnosis not present

## 2011-05-10 DIAGNOSIS — M6281 Muscle weakness (generalized): Secondary | ICD-10-CM | POA: Diagnosis not present

## 2011-05-13 DIAGNOSIS — M1710 Unilateral primary osteoarthritis, unspecified knee: Secondary | ICD-10-CM | POA: Diagnosis not present

## 2011-05-17 DIAGNOSIS — I129 Hypertensive chronic kidney disease with stage 1 through stage 4 chronic kidney disease, or unspecified chronic kidney disease: Secondary | ICD-10-CM | POA: Diagnosis not present

## 2011-05-17 DIAGNOSIS — I10 Essential (primary) hypertension: Secondary | ICD-10-CM | POA: Diagnosis not present

## 2011-05-17 DIAGNOSIS — M6281 Muscle weakness (generalized): Secondary | ICD-10-CM | POA: Diagnosis not present

## 2011-06-13 DIAGNOSIS — M545 Low back pain: Secondary | ICD-10-CM | POA: Diagnosis not present

## 2011-06-14 DIAGNOSIS — I10 Essential (primary) hypertension: Secondary | ICD-10-CM | POA: Diagnosis not present

## 2011-06-14 DIAGNOSIS — M6281 Muscle weakness (generalized): Secondary | ICD-10-CM | POA: Diagnosis not present

## 2011-06-22 DIAGNOSIS — M545 Low back pain: Secondary | ICD-10-CM | POA: Diagnosis not present

## 2011-06-27 DIAGNOSIS — M545 Low back pain: Secondary | ICD-10-CM | POA: Diagnosis not present

## 2011-07-01 DIAGNOSIS — M545 Low back pain: Secondary | ICD-10-CM | POA: Diagnosis not present

## 2011-07-05 DIAGNOSIS — M545 Low back pain: Secondary | ICD-10-CM | POA: Diagnosis not present

## 2011-07-08 DIAGNOSIS — M545 Low back pain: Secondary | ICD-10-CM | POA: Diagnosis not present

## 2011-07-12 DIAGNOSIS — M545 Low back pain: Secondary | ICD-10-CM | POA: Diagnosis not present

## 2011-07-18 DIAGNOSIS — M545 Low back pain: Secondary | ICD-10-CM | POA: Diagnosis not present

## 2011-08-03 DIAGNOSIS — F3342 Major depressive disorder, recurrent, in full remission: Secondary | ICD-10-CM | POA: Diagnosis not present

## 2011-08-22 DIAGNOSIS — H353190 Nonexudative age-related macular degeneration, unspecified eye, stage unspecified: Secondary | ICD-10-CM | POA: Diagnosis not present

## 2011-09-27 ENCOUNTER — Telehealth: Payer: Self-pay | Admitting: *Deleted

## 2011-09-27 ENCOUNTER — Ambulatory Visit (HOSPITAL_BASED_OUTPATIENT_CLINIC_OR_DEPARTMENT_OTHER): Payer: Medicare Other | Admitting: Oncology

## 2011-09-27 ENCOUNTER — Other Ambulatory Visit (HOSPITAL_BASED_OUTPATIENT_CLINIC_OR_DEPARTMENT_OTHER): Payer: Medicare Other | Admitting: Lab

## 2011-09-27 VITALS — BP 129/71 | HR 90 | Temp 97.8°F | Ht 64.0 in | Wt 187.5 lb

## 2011-09-27 DIAGNOSIS — Z170 Estrogen receptor positive status [ER+]: Secondary | ICD-10-CM | POA: Diagnosis not present

## 2011-09-27 DIAGNOSIS — C50419 Malignant neoplasm of upper-outer quadrant of unspecified female breast: Secondary | ICD-10-CM | POA: Diagnosis not present

## 2011-09-27 DIAGNOSIS — M810 Age-related osteoporosis without current pathological fracture: Secondary | ICD-10-CM | POA: Diagnosis not present

## 2011-09-27 DIAGNOSIS — E559 Vitamin D deficiency, unspecified: Secondary | ICD-10-CM

## 2011-09-27 DIAGNOSIS — C50919 Malignant neoplasm of unspecified site of unspecified female breast: Secondary | ICD-10-CM

## 2011-09-27 LAB — CBC WITH DIFFERENTIAL/PLATELET
BASO%: 0.5 % (ref 0.0–2.0)
Eosinophils Absolute: 0.1 10*3/uL (ref 0.0–0.5)
HCT: 36.2 % (ref 34.8–46.6)
LYMPH%: 19.2 % (ref 14.0–49.7)
MCHC: 32.7 g/dL (ref 31.5–36.0)
MCV: 93.5 fL (ref 79.5–101.0)
MONO#: 0.5 10*3/uL (ref 0.1–0.9)
MONO%: 6.7 % (ref 0.0–14.0)
NEUT%: 72.5 % (ref 38.4–76.8)
Platelets: 210 10*3/uL (ref 145–400)
WBC: 7.5 10*3/uL (ref 3.9–10.3)

## 2011-09-27 NOTE — Telephone Encounter (Signed)
gave patient appointment for 03-30-2012 starting at 11:30am printed out calendar and gave to the patient 

## 2011-09-27 NOTE — Telephone Encounter (Signed)
gave patient appointment for 03-30-2012 starting at 11:30am printed out calendar and gave to the patient

## 2011-09-27 NOTE — Progress Notes (Signed)
Hematology and Oncology Follow Up Visit  Stephanie Dickerson [id number] [birth year] 78 y.o. 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  Interim History:  Stephanie Dickerson is doing well. She has no complaints.  she is due for a  mammogram in July. She is tolerating her  medication well. There have been no other changes in her meds. She had a good memorial Day.She has migratory aches and pains unrelated to the AI.  Medications: I have reviewed the patient's current medications.  Allergies: No Known Allergies  Past Medical History, Surgical history, Social history, and Family History were reviewed and updated.  Review of Systems: Constitutional:  Negative for fever, chills, night sweats, anorexia, weight loss, pain. Cardiovascular: no chest pain or dyspnea on exertion Respiratory: no cough, shortness of breath, or wheezing Neurological: no TIA or stroke symptoms Dermatological: negative ENT: negative Skin Gastrointestinal: no abdominal pain, change in bowel habits, or black or bloody stools Genito-Urinary: no dysuria, trouble voiding, or hematuria Hematological and Lymphatic: negative Breast: negative for breast lumps Musculoskeletal: negative Remaining ROS negative.  Physical Exam: Blood pressure 129/71, pulse 90, temperature 97.8 F (36.6 C), height 5\' 4"  (1.626 m), weight 187 lb 8 oz (85.049 kg). ECOG: 0 General appearance: alert, cooperative, appears stated age and no distress Head: Normocephalic, without obvious abnormality, atraumatic Neck: no adenopathy, no carotid bruit, no JVD, supple, symmetrical, trachea midline and thyroid not enlarged, symmetric, no tenderness/mass/nodules Lymph nodes: Cervical, supraclavicular, and axillary nodes normal. Cardiac : Normal heart sound Pulmonary: Normal breath sounds Breasts: Right and left breasts are free  of any masses. There is no nipple retraction or skin changes. Both of the axilla examined were normal. Abdomen: Benign  Extremities  normal Neuro: Normal  Lab Results: Lab Results  Component Value Date   WBC 7.5 09/27/2011   HGB 11.8 09/27/2011   HCT 36.2 09/27/2011   MCV 93.5 09/27/2011   PLT 210 09/27/2011     Chemistry      Component Value Date/Time   NA 141 03/22/2011 1319   NA 141 03/22/2011 1319   NA 141 03/22/2011 1319   K 4.1 03/22/2011 1319   K 4.1 03/22/2011 1319   K 4.1 03/22/2011 1319   CL 103 03/22/2011 1319   CL 103 03/22/2011 1319   CL 103 03/22/2011 1319   CO2 28 03/22/2011 1319   CO2 28 03/22/2011 1319   CO2 28 03/22/2011 1319   BUN 33* 03/22/2011 1319   BUN 33* 03/22/2011 1319   BUN 33* 03/22/2011 1319   CREATININE 1.23* 03/22/2011 1319   CREATININE 1.23* 03/22/2011 1319   CREATININE 1.23* 03/22/2011 1319      Component Value Date/Time   CALCIUM 9.9 03/22/2011 1319   CALCIUM 9.9 03/22/2011 1319   CALCIUM 9.9 03/22/2011 1319   ALKPHOS 72 03/22/2011 1319   ALKPHOS 72 03/22/2011 1319   ALKPHOS 72 03/22/2011 1319   AST 20 03/22/2011 1319   AST 20 03/22/2011 1319   AST 20 03/22/2011 1319   ALT 14 03/22/2011 1319   ALT 14 03/22/2011 1319   ALT 14 03/22/2011 1319   BILITOT 0.5 03/22/2011 1319   BILITOT 0.5 03/22/2011 1319   BILITOT 0.5 03/22/2011 1319       Radiological Studies: chest X-ray n/a Mammogram n/a Bone density n/a  Impression and Plan: 78 year old woman with history of node-negative hormone receptor positive breast cancer on Arimidex she is doing well  without complaints and will be seen in 6 months time with appropriate imaging studies.  More than 50% of the visit was spent in patient-related counselling   Pierce Crane, MD 5/28/20132:33 PM

## 2011-09-28 LAB — COMPREHENSIVE METABOLIC PANEL
BUN: 26 mg/dL — ABNORMAL HIGH (ref 6–23)
CO2: 31 mEq/L (ref 19–32)
Calcium: 9.5 mg/dL (ref 8.4–10.5)
Chloride: 104 mEq/L (ref 96–112)
Creatinine, Ser: 1.09 mg/dL (ref 0.50–1.10)
Total Bilirubin: 0.3 mg/dL (ref 0.3–1.2)

## 2011-09-28 LAB — VITAMIN D 25 HYDROXY (VIT D DEFICIENCY, FRACTURES): Vit D, 25-Hydroxy: 50 ng/mL (ref 30–89)

## 2011-10-03 DIAGNOSIS — M1710 Unilateral primary osteoarthritis, unspecified knee: Secondary | ICD-10-CM | POA: Diagnosis not present

## 2011-10-03 DIAGNOSIS — M19019 Primary osteoarthritis, unspecified shoulder: Secondary | ICD-10-CM | POA: Diagnosis not present

## 2011-11-14 DIAGNOSIS — M19019 Primary osteoarthritis, unspecified shoulder: Secondary | ICD-10-CM | POA: Diagnosis not present

## 2011-11-14 DIAGNOSIS — M1710 Unilateral primary osteoarthritis, unspecified knee: Secondary | ICD-10-CM | POA: Diagnosis not present

## 2011-11-17 DIAGNOSIS — Z1231 Encounter for screening mammogram for malignant neoplasm of breast: Secondary | ICD-10-CM | POA: Diagnosis not present

## 2011-11-21 DIAGNOSIS — M1710 Unilateral primary osteoarthritis, unspecified knee: Secondary | ICD-10-CM | POA: Diagnosis not present

## 2011-11-29 DIAGNOSIS — M1710 Unilateral primary osteoarthritis, unspecified knee: Secondary | ICD-10-CM | POA: Diagnosis not present

## 2012-01-20 DIAGNOSIS — M899 Disorder of bone, unspecified: Secondary | ICD-10-CM | POA: Diagnosis not present

## 2012-01-20 DIAGNOSIS — M949 Disorder of cartilage, unspecified: Secondary | ICD-10-CM | POA: Diagnosis not present

## 2012-01-25 ENCOUNTER — Encounter: Payer: Self-pay | Admitting: Obstetrics and Gynecology

## 2012-02-02 ENCOUNTER — Ambulatory Visit (INDEPENDENT_AMBULATORY_CARE_PROVIDER_SITE_OTHER): Payer: Medicare Other | Admitting: Family Medicine

## 2012-02-02 ENCOUNTER — Encounter: Payer: Self-pay | Admitting: Family Medicine

## 2012-02-02 VITALS — BP 138/70 | Temp 98.4°F | Ht 63.0 in | Wt 190.0 lb

## 2012-02-02 DIAGNOSIS — E785 Hyperlipidemia, unspecified: Secondary | ICD-10-CM | POA: Diagnosis not present

## 2012-02-02 DIAGNOSIS — I10 Essential (primary) hypertension: Secondary | ICD-10-CM | POA: Diagnosis not present

## 2012-02-02 DIAGNOSIS — R739 Hyperglycemia, unspecified: Secondary | ICD-10-CM

## 2012-02-02 DIAGNOSIS — K5900 Constipation, unspecified: Secondary | ICD-10-CM | POA: Insufficient documentation

## 2012-02-02 DIAGNOSIS — R7309 Other abnormal glucose: Secondary | ICD-10-CM

## 2012-02-02 DIAGNOSIS — F411 Generalized anxiety disorder: Secondary | ICD-10-CM

## 2012-02-02 DIAGNOSIS — F419 Anxiety disorder, unspecified: Secondary | ICD-10-CM

## 2012-02-02 DIAGNOSIS — C50919 Malignant neoplasm of unspecified site of unspecified female breast: Secondary | ICD-10-CM

## 2012-02-02 DIAGNOSIS — Z23 Encounter for immunization: Secondary | ICD-10-CM | POA: Diagnosis not present

## 2012-02-02 DIAGNOSIS — M1990 Unspecified osteoarthritis, unspecified site: Secondary | ICD-10-CM

## 2012-02-02 HISTORY — DX: Anxiety disorder, unspecified: F41.9

## 2012-02-02 HISTORY — DX: Unspecified osteoarthritis, unspecified site: M19.90

## 2012-02-02 LAB — LIPID PANEL
Cholesterol: 184 mg/dL (ref 0–200)
HDL: 84.3 mg/dL (ref >39.00)

## 2012-02-02 LAB — HEMOGLOBIN A1C: Hgb A1c MFr Bld: 5.9 % (ref 4.6–6.5)

## 2012-02-02 NOTE — Patient Instructions (Signed)
-  We have ordered labs or studies at this visit. It usually takes 1-2 weeks for results and processing. We will contact you with instructions IF your results are abnormal. Normal results will be released to your [facility] in 1-2 weeks. If you have not heard from [place] or can not find your results in [facility] in 2 weeks please contact our office.  Follow up with me in about 4-6 months - schedule appointment on your way out.   Thank you for enrolling in MyChart. Please follow the instructions below to securely access your online medical record. MyChart allows you to send messages to your doctor, view your test results, renew your prescriptions, schedule appointments, and more.  How Do I Sign Up? 1. In your Internet browser, go to [email or web address] 2. Click on the New  User? link in the Sign In box.  3. Enter your MyChart Access Code exactly as it appears below. You will not need to use this code after you have completed the sign-up process. If you do not sign up before the expiration date, you must request a new code. MyChart Access Code: [email or web address] Expires: 03/03/2012  1:33 PM  4. Enter the last four digits of your Social Security Number (xxxx) and Date of Birth (mm/dd/yyyy) as indicated and click Next. You will be taken to the next sign-up page. 5. Create a MyChart ID. This will be your MyChart login ID and cannot be changed, so think of one that is secure and easy to remember. 6. Create a MyChart password. You can change your password at any time. 7. Enter your Password Reset Question and Answer and click Next. This can be used at a later time if you forget your password.  8. Select your communication preference, and if applicable enter your e-mail address. You will receive e-mail notification when new information is available in MyChart by choosing to receive e-mail notifications and filling in your e-mail. 9. Click Sign In. You can now view your medical record.   Additional  Information If you have questions, you can email [email or web address] or call [phone] to talk to our MyChart staff. Remember, MyChart is NOT to be used for urgent needs. For medical emergencies, dial 911.

## 2012-02-02 NOTE — Progress Notes (Addendum)
Chief Complaint  Patient presents with  . Establish Care    HPI:   Stephanie Dickerson is here to establish care.  Her chart was briefly reviewed prior to her visit and of note she has a hx of breast cancer, on anastrazole, followed by Dr. Donnie Coffin whom she is now seeing q6 months. Bone dexa ordered by Dr. Donnie Coffin 01/2012 with recs to continue lifestyle recs and repeat in 2 years.  She had CBC, CMP and Vitamin D labs in 08/2011 which looked good.   HTN: -takes Norvasc and and Losartan -well controlled -denies: CP, SOB, palpitations, swelling, headaches  Constipation: -regular bowel movements -sometimes constipated when doesn't drink enough fluids and uses stool softener  Hyperlipidemia: -on lipitor -stable  Depression/Anxiety/Insomnia: -followed/managed by [facility]/nurse who works with psychiatry -taking celexa, ativan one at night, trazadone, imiprimine -she is not sure why on all of these meds, has been on them for many years and feeling great - no troubles with anxiety or sleep at this time  Osteoarthritis/RTC and back pain: -followed/managed by Dr. Sedonia Small in [facility] -provided celebrex, glucosamine chondroitin -does not take vicodin any more - only uses very rarely when has a bad day maybe once per month  -does not take the robaxin - very rarely once to twice a year uses this  Wants flu today Has had tetanus, pheumo and shingles  Lives with son. No falls, uses cane, doing well.  ROS: See pertinent positives and negatives per HPI.  Past Medical History  Diagnosis Date  . Arthritis   . Cancer   . Chicken pox   . Depression   . Hypertension   . Hyperlipidemia     Family History  Problem Relation Age of Onset  . Lung cancer      husband  . Bone cancer Mother     History   Social History  . Marital Status: Widowed    Spouse Name: N/A    Number of Children: N/A  . Years of Education: N/A   Social History Main Topics  . Smoking status: Former Games developer  .  Smokeless tobacco: None  . Alcohol Use: Yes     per pt a glass of white wine before dinner   . Drug Use: None  . Sexually Active: None   Other Topics Concern  . None   Social History Narrative  . None    Current outpatient prescriptions:amLODipine (NORVASC) 10 MG tablet, Take 10 mg by mouth daily., Disp: , Rfl: ;  anastrozole (ARIMIDEX) 1 MG tablet, Take 1 mg by mouth daily., Disp: , Rfl: ;  aspirin 81 MG tablet, Take 81 mg by mouth daily., Disp: , Rfl: ;  atorvastatin (LIPITOR) 40 MG tablet, Take 40 mg by mouth daily., Disp: , Rfl: ;  calcium carbonate (OS-CAL) 600 MG TABS, Take 600 mg by mouth 2 (two) times daily with a meal., Disp: , Rfl:  Casanthranol-Docusate Sodium 30-100 MG CAPS, Take 2 tablets by mouth., Disp: , Rfl: ;  celecoxib (CELEBREX) 200 MG capsule, Take 200 mg by mouth 2 (two) times daily., Disp: , Rfl: ;  citalopram (CELEXA) 20 MG tablet, , Disp: , Rfl: ;  glucosamine-chondroitin 500-400 MG tablet, Take 1 tablet by mouth 3 (three) times daily., Disp: , Rfl: ;  HYDROcodone-acetaminophen (NORCO/VICODIN) 5-325 MG per tablet, , Disp: , Rfl:  LORazepam (ATIVAN) 1 MG tablet, Take 1 mg by mouth every 8 (eight) hours., Disp: , Rfl: ;  losartan (COZAAR) 100 MG tablet, Take 100 mg by mouth daily.,  Disp: , Rfl: ;  methocarbamol (ROBAXIN) 500 MG tablet, Take 500 mg by mouth 4 (four) times daily., Disp: , Rfl: ;  Multiple Vitamins-Minerals (ICAPS) CAPS, Take 1 capsule by mouth., Disp: , Rfl: ;  traZODone (DESYREL) 100 MG tablet, Take 100 mg by mouth at bedtime., Disp: , Rfl:  imipramine (TOFRANIL) 50 MG tablet, Take 50 mg by mouth at bedtime., Disp: , Rfl:   EXAM:  Filed Vitals:   02/02/12 1255  BP: 138/70  Temp: 98.4 F (36.9 C)    Body mass index is 33.66 kg/(m^2).  GENERAL: vitals reviewed and listed below, alert, oriented, appears well hydrated and in no acute distress  HEENT: atraumatic, conjucntiva clear, no obvious abnormalities on inspection of external nose and  ears  NECK: no masses on inspection  LUNGS: clear to auscultation bilaterally, no wheezes, rales or rhonchi, good air movement  CV: HRRR, SEM II/VIno peripheral edema  MS: moves all extremities without noticeable abnormality  PSYCH: pleasant and cooperative, no obvious depression or anxiety  ASSESSMENT AND PLAN:  Discussed the following assessment and plan:  1. Hyperlipidemia  Lipid Panel  2. Hypertension    3. Hyperglycemia  Hemoglobin A1c  4. Constipation    5. Anxiety    6. Osteoarthritis    7. Hx of Breast cancer - managed by Dr. Donnie Coffin     -doing quite well, very functional and sharp -will check lipids and hgba1c -will send note to psych as on many medications and wonders why with no psych symptoms in may years - if can wean of of some of these may prevent issues with polypharm in the future -utd on preventive care -flu today -reviewed and updated PMH, PSH, FH, SH and Meds -also advised on advanced directives/HCPOA and she will look into this -follow up in 4-6 months  Orders Placed This Encounter  Procedures  . Lipid Panel  . Hemoglobin A1c    Patient Instructions  -We have ordered labs or studies at this visit. It usually takes 1-2 weeks for results and processing. We will contact you with instructions IF your results are abnormal. Normal results will be released to your [facility] in 1-2 weeks. If you have not heard from [place] or can not find your results in [facility] in 2 weeks please contact our office.  Follow up with me in about 4-6 months - schedule appointment on your way out.   Thank you for enrolling in MyChart. Please follow the instructions below to securely access your online medical record. MyChart allows you to send messages to your doctor, view your test results, renew your prescriptions, schedule appointments, and more.  How Do I Sign Up? 1. In your Internet browser, go to [email or web address] 2. Click on the New  User? link in the Sign  In box.  3. Enter your MyChart Access Code exactly as it appears below. You will not need to use this code after you have completed the sign-up process. If you do not sign up before the expiration date, you must request a new code. MyChart Access Code: [email or web address] Expires: 03/03/2012  1:33 PM  4. Enter the last four digits of your Social Security Number (xxxx) and Date of Birth (mm/dd/yyyy) as indicated and click Next. You will be taken to the next sign-up page. 5. Create a MyChart ID. This will be your MyChart login ID and cannot be changed, so think of one that is secure and easy to remember. 6. Create a MyChart password. You can  change your password at any time. 7. Enter your Password Reset Question and Answer and click Next. This can be used at a later time if you forget your password.  8. Select your communication preference, and if applicable enter your e-mail address. You will receive e-mail notification when new information is available in MyChart by choosing to receive e-mail notifications and filling in your e-mail. 9. Click Sign In. You can now view your medical record.   Additional Information If you have questions, you can email [email or web address]  [email or web address] or call [phone] to talk to our MyChart staff. Remember, MyChart is NOT to be used for urgent needs. For medical emergencies, dial 911.             Return to clinic immediately if symptoms worsen or persist or new concerns.  Return in about 4 months (around 06/04/2012) for HTN, HLP.  Stephanie Dickerson R.

## 2012-02-03 NOTE — Progress Notes (Signed)
Quick Note:  Called and spoke with pt and pt is aware. ______ 

## 2012-02-06 ENCOUNTER — Encounter: Payer: Self-pay | Admitting: Family Medicine

## 2012-02-09 DIAGNOSIS — F3342 Major depressive disorder, recurrent, in full remission: Secondary | ICD-10-CM | POA: Diagnosis not present

## 2012-02-16 ENCOUNTER — Encounter: Payer: Self-pay | Admitting: Gynecology

## 2012-02-16 DIAGNOSIS — F32A Depression, unspecified: Secondary | ICD-10-CM | POA: Insufficient documentation

## 2012-02-16 DIAGNOSIS — M8580 Other specified disorders of bone density and structure, unspecified site: Secondary | ICD-10-CM | POA: Insufficient documentation

## 2012-02-16 DIAGNOSIS — C801 Malignant (primary) neoplasm, unspecified: Secondary | ICD-10-CM | POA: Insufficient documentation

## 2012-02-16 DIAGNOSIS — M1990 Unspecified osteoarthritis, unspecified site: Secondary | ICD-10-CM | POA: Insufficient documentation

## 2012-02-16 DIAGNOSIS — F329 Major depressive disorder, single episode, unspecified: Secondary | ICD-10-CM | POA: Insufficient documentation

## 2012-02-20 ENCOUNTER — Encounter: Payer: Self-pay | Admitting: Family Medicine

## 2012-02-20 DIAGNOSIS — H4011X0 Primary open-angle glaucoma, stage unspecified: Secondary | ICD-10-CM | POA: Diagnosis not present

## 2012-02-20 DIAGNOSIS — H353190 Nonexudative age-related macular degeneration, unspecified eye, stage unspecified: Secondary | ICD-10-CM | POA: Diagnosis not present

## 2012-02-20 DIAGNOSIS — H409 Unspecified glaucoma: Secondary | ICD-10-CM | POA: Diagnosis not present

## 2012-02-21 ENCOUNTER — Telehealth: Payer: Self-pay | Admitting: Family Medicine

## 2012-02-21 NOTE — Telephone Encounter (Signed)
Pt would like a pap smear. Can I sch? Pt is 78 years old

## 2012-02-21 NOTE — Telephone Encounter (Signed)
Called and left a message for pt to return call.  Also sent pt a mychart response.  Per Dr. Selena Batten there are no indications that women after age 76 need a pap smear as long as normal screening in the past according to the [employer] manager.

## 2012-02-21 NOTE — Telephone Encounter (Signed)
Called and spoke with pt and pt is aware. Pt states it keeps coming up on chart.  Pt states she understands and is fine with this.

## 2012-02-28 ENCOUNTER — Encounter: Payer: Medicare Other | Admitting: Obstetrics and Gynecology

## 2012-02-29 DIAGNOSIS — L820 Inflamed seborrheic keratosis: Secondary | ICD-10-CM | POA: Diagnosis not present

## 2012-02-29 DIAGNOSIS — L919 Hypertrophic disorder of the skin, unspecified: Secondary | ICD-10-CM | POA: Diagnosis not present

## 2012-02-29 DIAGNOSIS — B078 Other viral warts: Secondary | ICD-10-CM | POA: Diagnosis not present

## 2012-02-29 DIAGNOSIS — B079 Viral wart, unspecified: Secondary | ICD-10-CM | POA: Diagnosis not present

## 2012-03-02 ENCOUNTER — Other Ambulatory Visit: Payer: Self-pay

## 2012-03-02 DIAGNOSIS — C50919 Malignant neoplasm of unspecified site of unspecified female breast: Secondary | ICD-10-CM

## 2012-03-02 DIAGNOSIS — E559 Vitamin D deficiency, unspecified: Secondary | ICD-10-CM

## 2012-03-02 MED ORDER — AMLODIPINE BESYLATE 10 MG PO TABS: 10.0000 mg | ORAL_TABLET | Freq: Every day | ORAL | Status: DC

## 2012-03-02 MED ORDER — ATORVASTATIN CALCIUM 40 MG PO TABS: ORAL_TABLET | ORAL | Status: DC

## 2012-03-02 NOTE — Telephone Encounter (Signed)
Rx request for atrovastatin and amlodipine to [facility].

## 2012-03-06 ENCOUNTER — Other Ambulatory Visit: Payer: Self-pay

## 2012-03-06 DIAGNOSIS — E559 Vitamin D deficiency, unspecified: Secondary | ICD-10-CM

## 2012-03-06 DIAGNOSIS — C50919 Malignant neoplasm of unspecified site of unspecified female breast: Secondary | ICD-10-CM

## 2012-03-06 MED ORDER — LOSARTAN POTASSIUM 100 MG PO TABS: 100.0000 mg | ORAL_TABLET | Freq: Every day | ORAL | Status: DC

## 2012-03-06 NOTE — Telephone Encounter (Signed)
rx for losartan sent to pharmacy.

## 2012-03-19 ENCOUNTER — Other Ambulatory Visit: Payer: Self-pay | Admitting: *Deleted

## 2012-03-19 DIAGNOSIS — C50919 Malignant neoplasm of unspecified site of unspecified female breast: Secondary | ICD-10-CM

## 2012-03-19 DIAGNOSIS — M1710 Unilateral primary osteoarthritis, unspecified knee: Secondary | ICD-10-CM | POA: Diagnosis not present

## 2012-03-19 DIAGNOSIS — E559 Vitamin D deficiency, unspecified: Secondary | ICD-10-CM

## 2012-03-19 MED ORDER — ANASTROZOLE 1 MG PO TABS: 1.0000 mg | ORAL_TABLET | Freq: Every day | ORAL | Status: DC

## 2012-03-26 DIAGNOSIS — H40019 Open angle with borderline findings, low risk, unspecified eye: Secondary | ICD-10-CM | POA: Diagnosis not present

## 2012-03-30 ENCOUNTER — Other Ambulatory Visit: Payer: Medicare Other | Admitting: Lab

## 2012-03-30 ENCOUNTER — Ambulatory Visit: Payer: Medicare Other | Admitting: Oncology

## 2012-05-02 ENCOUNTER — Other Ambulatory Visit: Payer: Self-pay | Admitting: Family Medicine

## 2012-05-03 ENCOUNTER — Other Ambulatory Visit: Payer: Medicare Other | Admitting: Lab

## 2012-05-06 ENCOUNTER — Encounter: Payer: Self-pay | Admitting: Family Medicine

## 2012-05-07 ENCOUNTER — Encounter: Payer: Self-pay | Admitting: Family Medicine

## 2012-05-08 ENCOUNTER — Other Ambulatory Visit: Payer: Self-pay

## 2012-05-08 MED ORDER — ATORVASTATIN CALCIUM 80 MG PO TABS: ORAL_TABLET | ORAL | Status: DC

## 2012-05-08 NOTE — Telephone Encounter (Signed)
Rx sent to the pharmacy for lipitor 80 mg; take 1/2 tablet by mouth daily.

## 2012-05-10 ENCOUNTER — Ambulatory Visit: Payer: Medicare Other | Admitting: Oncology

## 2012-05-15 ENCOUNTER — Other Ambulatory Visit: Payer: Self-pay | Admitting: Oncology

## 2012-05-15 DIAGNOSIS — C50919 Malignant neoplasm of unspecified site of unspecified female breast: Secondary | ICD-10-CM

## 2012-05-26 ENCOUNTER — Other Ambulatory Visit: Payer: Self-pay | Admitting: Oncology

## 2012-06-04 ENCOUNTER — Telehealth: Payer: Self-pay | Admitting: *Deleted

## 2012-06-04 NOTE — Telephone Encounter (Signed)
Left msg for pt to return call to r/s f/u appt. 

## 2012-06-05 ENCOUNTER — Encounter: Payer: Self-pay | Admitting: Oncology

## 2012-06-05 ENCOUNTER — Telehealth: Payer: Self-pay | Admitting: *Deleted

## 2012-06-05 NOTE — Telephone Encounter (Signed)
Pt called and I confirmed 06/19/12 appt w/ pt.  Mailed letter & calendar to pt. 

## 2012-06-19 ENCOUNTER — Ambulatory Visit (HOSPITAL_BASED_OUTPATIENT_CLINIC_OR_DEPARTMENT_OTHER): Payer: Medicare Other | Admitting: Gynecologic Oncology

## 2012-06-19 ENCOUNTER — Encounter: Payer: Self-pay | Admitting: Gynecologic Oncology

## 2012-06-19 ENCOUNTER — Telehealth: Payer: Self-pay | Admitting: Oncology

## 2012-06-19 VITALS — BP 155/72 | HR 74 | Temp 97.6°F | Resp 20 | Ht 63.0 in | Wt 191.6 lb

## 2012-06-19 DIAGNOSIS — C50419 Malignant neoplasm of upper-outer quadrant of unspecified female breast: Secondary | ICD-10-CM | POA: Diagnosis not present

## 2012-06-19 DIAGNOSIS — Z170 Estrogen receptor positive status [ER+]: Secondary | ICD-10-CM

## 2012-06-19 DIAGNOSIS — M8580 Other specified disorders of bone density and structure, unspecified site: Secondary | ICD-10-CM

## 2012-06-19 DIAGNOSIS — M899 Disorder of bone, unspecified: Secondary | ICD-10-CM | POA: Diagnosis not present

## 2012-06-19 DIAGNOSIS — M949 Disorder of cartilage, unspecified: Secondary | ICD-10-CM

## 2012-06-19 DIAGNOSIS — C50919 Malignant neoplasm of unspecified site of unspecified female breast: Secondary | ICD-10-CM

## 2012-06-19 MED ORDER — ALENDRONATE SODIUM 35 MG PO TABS: 35.0000 mg | ORAL_TABLET | ORAL | Status: DC

## 2012-06-19 NOTE — Progress Notes (Signed)
OFFICE PROGRESS NOTE  CC  Stephanie Dickerson., DO [address] [place] [address]  DIAGNOSIS:  Stephanie Dickerson is a 79 year old [place] woman who underwent a screening mammogram on 11/06/2007, which showed a spiculated 1.5 cm isodense mass upper outer quadrant at 10 o'clock position 4 cm from the nipple.  Ultrasound confirmed the presence of a 1.8 cm mass.  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 The mass was low-grade, ER + 99%, PR + at 100%, proliferative index 8%, HER-2 was 1+.  Breast specific gamma imaging performed on 11/07/2007 showed the previously mentioned activity at similar location but the maximum uptake was about 1 cm.    PRIOR THERAPY: The patient elected to undergo a right breast lumpectomy and sentinel lymph node evaluation on 11/28/2007.  Final pathology showed a 1.7 cm invasive ductal carcinoma, grade I of III, DCIS low grade with negative surgical margins.  3 sentinel lymph nodes were all negative as well.  She declined radiation therapy and started Tamoxifen in 01/2008.  She took Tamoxifen for 4 to 5 years and was switched to Arimidex.    CURRENT THERAPY:  Arimidex 1 mg daily.  INTERVAL HISTORY: Stephanie Dickerson 79 y.o. female returns for continued follow up.  She is tolerating Arimidex well with no hot flashes or vaginal dryness.  She reports intermittent joint pain that she relates to arthritis, carpal tunnel in her right wrist, and left ankle fracture in the past.  She has supportive ace bandages on her left ankle and right wrist "for support."  She reports a decrease in mobility due to her arthritis and she uses a cane when walking for support.    MEDICAL HISTORY: Past Medical History  Diagnosis Date  . Arthritis   . Chicken pox   . Hyperlipidemia   . Osteopenia   . Hypertension   . Cancer     Breast cancer-Right  . Depression     ALLERGIES:  has No Known  Allergies.  MEDICATIONS:  Current Outpatient Prescriptions  Medication Sig Dispense Refill  . amLODipine (NORVASC) 10 MG tablet Take 1 tablet (10 mg total) by mouth daily.  90 tablet  0  . anastrozole (ARIMIDEX) 1 MG tablet TAKE 1 TABLET (1 MG TOTAL) BY MOUTH DAILY.  30 tablet  2  . aspirin 81 MG tablet Take 81 mg by mouth daily.      Marland Kitchen atorvastatin (LIPITOR) 80 MG tablet Take 1/2 tablet by mouth once daily.  90 tablet  1  . calcium carbonate (OS-CAL) 600 MG TABS Take 600 mg by mouth 2 (two) times daily with a meal.      . Casanthranol-Docusate Sodium 30-100 MG CAPS Take 2 tablets by mouth.      . celecoxib (CELEBREX) 200 MG capsule Take 200 mg by mouth 2 (two) times daily.      . citalopram (CELEXA) 20 MG tablet       . glucosamine-chondroitin 500-400 MG tablet Take 1 tablet by mouth 3 (three) times daily.      Marland Kitchen HYDROcodone-acetaminophen (NORCO/VICODIN) 5-325 MG per tablet       . imipramine (TOFRANIL) 50 MG tablet Take 50 mg by mouth at bedtime.      Marland Kitchen LORazepam (ATIVAN) 1 MG tablet Take 1 mg by mouth every 8 (eight) hours.      Marland Kitchen losartan (COZAAR) 100 MG tablet Take 1 tablet (100 mg total) by mouth daily.  90  tablet  2  . LUMIGAN 0.01 % SOLN       . methocarbamol (ROBAXIN) 500 MG tablet Take 500 mg by mouth 4 (four) times daily.      . Multiple Vitamins-Minerals (ICAPS) CAPS Take 1 capsule by mouth.      . traZODone (DESYREL) 100 MG tablet Take 100 mg by mouth at bedtime.      Marland Kitchen alendronate (FOSAMAX) 35 MG tablet Take 1 tablet (35 mg total) by mouth every 7 (seven) days. Take with a full glass of water on an empty stomach.  12 tablet  6   No current facility-administered medications for this visit.    SURGICAL HISTORY:  Past Surgical History  Procedure Laterality Date  . Tonsillectomy and adenoidectomy    . Rotator cuff surgery  2011  . Arthroscopy knee surgery      left  . Cataract surgery    . Breast surgery      Lumpectomy  . Spine surgery      REVIEW OF SYSTEMS:   Constitutional: Feels well.  Mildly limited due to arthritis.  Cardiovascular: No chest pain or shortness of breath.  Intermittent bilateral ankle edema that she relates to taking Norvasc.  Pulmonary: No cough or wheeze.  Gastrointestinal: No nausea, vomiting, or diarrhea. No bright red blood per rectum or change in bowel movement.  Genitourinary: No frequency, urgency, or dysuria. No vaginal bleeding or discharge.  Musculoskeletal: No myalgia.  Intermittent joint pain. Neurologic: No weakness, numbness, or change in gait.  Psychology: No depression, anxiety, or insomnia  HEALTH MAINTENANCE: Mammogram:  11/23/11 Colonoscopy:  "Don't want another." Bone Density:  01/20/12 resulting low bone mass Pap Smear:  Managed by Dr. Eda Paschal in the past Eye Exam: Annually and now more frequently due to glaucoma in the right eye Vitamin D: 09/27/11 Lipid Panel: Managed by Dr. Selena Batten  PHYSICAL EXAMINATION: Blood pressure 155/72, pulse 74, temperature 97.6 F (36.4 C), temperature source Oral, resp. rate 20, height 5\' 3"  (1.6 m), weight 191 lb 9.6 oz (86.909 kg). Body mass index is 33.95 kg/(m^2). ECOG PERFORMANCE STATUS: 1 - Symptomatic but completely ambulatory  General: Well developed, well nourished female in no acute distress. Alert and oriented x 3.  Head/Neck: Oropharynx clear.  Sclerae anicteric.  Supple without any enlargements.  Lymph node survey: No cervical, supraclavicular, or axillary adenopathy.  Cardiovascular: Regular rate and rhythm. S1 and S2 normal.  Mild systolic murmur noted.  Lungs: Clear to auscultation bilaterally. No wheezes/crackles/rhonchi noted.  Skin: No rashes or lesions present. Back: No CVA tenderness.  Abdomen: Abdomen soft, non-tender and obese. Active bowel sounds in all quadrants. No evidence of a fluid wave, organomegaly, or abdominal masses.  Breasts:  Right lumpectomy scar noted.  Inspection negative with no nodularity, masses, erythema, or discharge noted  bilaterally. Extremities: No bilateral cyanosis or clubbing.  Mild, non-pitting edema noted bilaterally.   LABORATORY DATA: Lab Results  Component Value Date   WBC 7.5 09/27/2011   HGB 11.8 09/27/2011   HCT 36.2 09/27/2011   MCV 93.5 09/27/2011   PLT 210 09/27/2011      Chemistry      Component Value Date/Time   NA 140 09/27/2011 1331   K 4.7 09/27/2011 1331   CL 104 09/27/2011 1331   CO2 31 09/27/2011 1331   BUN 26* 09/27/2011 1331   CREATININE 1.09 09/27/2011 1331      Component Value Date/Time   CALCIUM 9.5 09/27/2011 1331   ALKPHOS 77 09/27/2011 1331  AST 17 09/27/2011 1331   ALT 15 09/27/2011 1331   BILITOT 0.3 09/27/2011 1331      RADIOGRAPHIC STUDIES:  No results found.  ASSESSMENT: 79 year old [place] woman: #1  S/p right lumpectomy on 11/28/07 for T1c, N0 IDC with DCIS, Stage I, Grade 1, ER+ 99%, PR+ 100%, Ki67 8%, HER2 - 1+.    #2  She declined radiation therapy.  #3  She started Tamoxifen in 01/2008.  She took Tamoxifen for 4 to 5 years and was switched to Arimidex.    PLAN:  She is to begin taking Fosamax once weekly along with Vitamin D3 2000 IU daily and Caltrate with Vitamin D twice daily.  Potential side effects of Fosamax discussed with the patient and Dr. Welton Flakes along with the administration instructions.  She is to follow up with Dr. Welton Flakes in 6 months or sooner if problems arise with lab work prior to that visit.  All questions were answered. The patient knows to call the clinic with any problems, questions or concerns. We can certainly see the patient much sooner if necessary.  I spent 30 minutes counseling the patient face to face and Dr. Welton Flakes spoke with the patient for 10 minutes about future plans and recommendations. The total time spent in the appointment was 60 minutes.  Stephanie Mccreedy, NP Medical/Oncology [facility] [phone] (Office)  06/19/2012, 9:02 PM

## 2012-06-19 NOTE — Patient Instructions (Addendum)
Doing well.  Begin Fosamax once weekly.  Begin taking Vitamin D3 2000 IU daily and take Caltrate with Vit D twice daily.  Plan to follow up with Dr. Welton Flakes in 6 months with lab work at that time.  Please call for any questions or concerns.  Alendronate weekly tablets What is this medicine? ALENDRONATE (a LEN droe nate) slows calcium loss from bones. It helps to make healthy bone and to slow bone loss in people with osteoporosis. It may be used to treat Paget's disease. This medicine may be used for other purposes; ask your health care provider or pharmacist if you have questions. What should I tell my health care provider before I take this medicine? They need to know if you have any of these conditions: -esophagus, stomach, or intestine problems, like acid-reflux or GERD -dental disease -kidney disease -low blood calcium -low vitamin D -problems swallowing -problems sitting or standing for 30 minutes -an unusual or allergic reaction to alendronate, other medicines, foods, dyes, or preservatives -pregnant or trying to get pregnant -breast-feeding How should I use this medicine? You must take this medicine exactly as directed or you will lower the amount of medicine you absorb into your body or you may cause yourself harm. Take your dose by mouth first thing in the morning, after you are up for the day. Do not eat or drink anything before you take this medicine. Swallow your medicine with a 2 full glasses (6 to 8 fluid ounces) of plain water. Do not take this tablet with any other drink. Do not chew or crush the tablet. After taking this medicine, do not eat breakfast, drink, or take any medicines or vitamins for at least 30 minutes. Stand or sit up for at least 30 minutes after you take this medicine; do not lie down. Take this medicine on the same day every week. Do not take your medicine more often than directed. Talk to your pediatrician regarding the use of this medicine in children. Special  care may be needed. Overdosage: If you think you have taken too much of this medicine contact a poison control center or emergency room at once. NOTE: This medicine is only for you. Do not share this medicine with others. What if I miss a dose? If you miss a dose, take the dose on the morning after you remember. Then take your next dose on your regular day of the week. Never take 2 tablets on the same day. Do not take double or extra doses. What may interact with this medicine? -aluminum hydroxide -antacids -aspirin -calcium supplements -drugs for inflammation like ibuprofen, naproxen, and others -iron supplements -magnesium supplements -vitamins with minerals This list may not describe all possible interactions. Give your health care provider a list of all the medicines, herbs, non-prescription drugs, or dietary supplements you use. Also tell them if you smoke, drink alcohol, or use illegal drugs. Some items may interact with your medicine. What should I watch for while using this medicine? Visit your doctor or health care professional for regular checks ups. It may be some time before you see benefit from this medicine. Do not stop taking your medication except on your doctor's advice. Your doctor or health care professional may order blood tests and other tests to see how you are doing. You should make sure you get enough calcium and vitamin D while you are taking this medicine, unless your doctor tells you not to. Discuss the foods you eat and the vitamins you take with  your health care professional. Some people who take this medicine have severe bone, joint, and/or muscle pain. This medicine may also increase your risk for a broken thigh bone. Tell your doctor right away if you have pain in your upper leg or groin. Tell your doctor if you have any pain that does not go away or that gets worse. This medicine can make you more sensitive to the sun. If you get a rash while taking this medicine,  sunlight may cause the rash to get worse. Keep out of the sun. If you cannot avoid being in the sun, wear protective clothing and use sunscreen. Do not use sun lamps or tanning beds/booths. What side effects may I notice from receiving this medicine? Side effects that you should report to your doctor or health care professional as soon as possible: -allergic reactions like skin rash, itching or hives, swelling of the face, lips, or tongue -black or tarry stools -bone, muscle or joint pain -changes in vision -chest pain -heartburn or stomach pain -jaw pain, especially after dental work -pain or trouble when swallowing -redness, blistering, peeling or loosening of the skin, including inside the mouth Side effects that usually do not require medical attention (report to your doctor or health care professional if they continue or are bothersome): -changes in taste -diarrhea or constipation -eye pain or itching -headache -nausea or vomiting -stomach gas or fullness This list may not describe all possible side effects. Call your doctor for medical advice about side effects. You may report side effects to FDA at 1-800-FDA-1088. Where should I keep my medicine? Keep out of the reach of children. Store at room temperature of 15 and 30 degrees C (59 and 86 degrees F). Throw away any unused medicine after the expiration date. NOTE: This sheet is a summary. It may not cover all possible information. If you have questions about this medicine, talk to your doctor, pharmacist, or health care provider.  2013, [employer]. (03/03/2011 9:02:42 AM)

## 2012-06-19 NOTE — Telephone Encounter (Signed)
gv pt appt schedule for August.  °

## 2012-06-22 DIAGNOSIS — M1710 Unilateral primary osteoarthritis, unspecified knee: Secondary | ICD-10-CM | POA: Diagnosis not present

## 2012-06-23 ENCOUNTER — Other Ambulatory Visit: Payer: Self-pay | Admitting: Family Medicine

## 2012-06-29 DIAGNOSIS — M1710 Unilateral primary osteoarthritis, unspecified knee: Secondary | ICD-10-CM | POA: Diagnosis not present

## 2012-07-10 DIAGNOSIS — M1710 Unilateral primary osteoarthritis, unspecified knee: Secondary | ICD-10-CM | POA: Diagnosis not present

## 2012-07-16 ENCOUNTER — Other Ambulatory Visit: Payer: Self-pay | Admitting: Oncology

## 2012-07-25 DIAGNOSIS — H4011X0 Primary open-angle glaucoma, stage unspecified: Secondary | ICD-10-CM | POA: Diagnosis not present

## 2012-07-25 DIAGNOSIS — H409 Unspecified glaucoma: Secondary | ICD-10-CM | POA: Diagnosis not present

## 2012-08-03 ENCOUNTER — Other Ambulatory Visit: Payer: Self-pay | Admitting: Family Medicine

## 2012-08-09 ENCOUNTER — Encounter: Payer: Self-pay | Admitting: Family Medicine

## 2012-08-09 ENCOUNTER — Ambulatory Visit (INDEPENDENT_AMBULATORY_CARE_PROVIDER_SITE_OTHER): Payer: Medicare Other | Admitting: Family Medicine

## 2012-08-09 VITALS — BP 150/76 | HR 85 | Temp 98.3°F | Wt 186.0 lb

## 2012-08-09 DIAGNOSIS — R5381 Other malaise: Secondary | ICD-10-CM | POA: Diagnosis not present

## 2012-08-09 DIAGNOSIS — Z79899 Other long term (current) drug therapy: Secondary | ICD-10-CM | POA: Diagnosis not present

## 2012-08-09 DIAGNOSIS — R5383 Other fatigue: Secondary | ICD-10-CM

## 2012-08-09 NOTE — Progress Notes (Addendum)
Chief Complaint  Patient presents with  . Fatigue    HPI:  78 yo F here for acute visit for fatigue: -new to our practice and I have seen her once -she reports she is doing really well, but has noticed gets tired sometime - has been going on for a long time. Thinks trazadone and her medicine is causing this - take 300mg  nightly - she has tried decreasing it to 250 and already felt much better and wants to derease it further but was not sure if she could -hx of breast cancer on anastrazole followed closely by oncology -patient reports: worsening function since breast cancer with decreased mobility related to medication and followed by onc -also hx mental health problems and on many sedating meds - trazadone, imipramine, celexa, ativan, methocarbamol (rarely)? Sees Jaynie Bream - seeing her sooner  -reports anxiety was bad at one time with life issues, but better now, but continues all these medications -I had concerns for polypharm last visit and left message for her psych provider to call me back, but unfortunately did not here back from that office -Denies: cough, SOB, CP, bowel changes, weight loss, blood in stool, rashes, dysuria, swelling, palpitations -has chronic joint and muscle pains and they worsened from the fosamax  ROS: See pertinent positives and negatives per HPI.  Past Medical History  Diagnosis Date  . Arthritis   . Chicken pox   . Hyperlipidemia   . Osteopenia   . Hypertension   . Cancer     Breast cancer-Right  . Depression     Family History  Problem Relation Age of Onset  . Lung cancer      husband  . Bone cancer Mother   . Leukemia Father     History   Social History  . Marital Status: Widowed    Spouse Name: N/A    Number of Children: N/A  . Years of Education: N/A   Social History Main Topics  . Smoking status: Former Games developer  . Smokeless tobacco: None  . Alcohol Use: Yes     Comment: per pt a glass of white wine before dinner   . Drug Use:  None  . Sexually Active: Yes    Birth Control/ Protection: Post-menopausal   Other Topics Concern  . None   Social History Narrative  . None    Current outpatient prescriptions:alendronate (FOSAMAX) 35 MG tablet, Take 1 tablet (35 mg total) by mouth every 7 (seven) days. Take with a full glass of water on an empty stomach., Disp: 12 tablet, Rfl: 6;  amLODipine (NORVASC) 10 MG tablet, TAKE 1 TABLET (10 MG TOTAL) BY MOUTH DAILY., Disp: 30 tablet, Rfl: 5;  anastrozole (ARIMIDEX) 1 MG tablet, TAKE 1 TABLET (1 MG TOTAL) BY MOUTH DAILY., Disp: 30 tablet, Rfl: 5 aspirin 81 MG tablet, Take 81 mg by mouth daily., Disp: , Rfl: ;  atorvastatin (LIPITOR) 80 MG tablet, TAKE 1/2 TABLET BY MOUTH ONCE DAILY., Disp: 45 tablet, Rfl: 2;  calcium carbonate (OS-CAL) 600 MG TABS, Take 600 mg by mouth 2 (two) times daily with a meal., Disp: , Rfl: ;  Casanthranol-Docusate Sodium 30-100 MG CAPS, Take 2 tablets by mouth., Disp: , Rfl:  celecoxib (CELEBREX) 200 MG capsule, Take 200 mg by mouth 2 (two) times daily., Disp: , Rfl: ;  citalopram (CELEXA) 20 MG tablet, , Disp: , Rfl: ;  glucosamine-chondroitin 500-400 MG tablet, Take 1 tablet by mouth 3 (three) times daily., Disp: , Rfl: ;  imipramine (TOFRANIL)  50 MG tablet, Take 50 mg by mouth at bedtime., Disp: , Rfl: ;  LORazepam (ATIVAN) 1 MG tablet, Take 1 mg by mouth every 8 (eight) hours., Disp: , Rfl:  losartan (COZAAR) 100 MG tablet, Take 1 tablet (100 mg total) by mouth daily., Disp: 90 tablet, Rfl: 2;  LUMIGAN 0.01 % SOLN, , Disp: , Rfl: ;  methocarbamol (ROBAXIN) 500 MG tablet, Take 500 mg by mouth 4 (four) times daily., Disp: , Rfl: ;  Multiple Vitamins-Minerals (ICAPS) CAPS, Take 1 capsule by mouth., Disp: , Rfl: ;  traZODone (DESYREL) 100 MG tablet, Take 100 mg by mouth at bedtime., Disp: , Rfl:   EXAM:  Filed Vitals:   08/09/12 1321  BP: 150/76  Pulse: 85  Temp: 98.3 F (36.8 C)    Body mass index is 32.96 kg/(m^2).  GENERAL: vitals reviewed and listed  above, alert, oriented, appears well hydrated and in no acute distress  HEENT: atraumatic, conjunttiva clear, no obvious abnormalities on inspection of external nose and ears  NECK: no obvious masses on inspection  LUNGS: clear to auscultation bilaterally, no wheezes, rales or rhonchi, good air movement  CV: HRRR, no peripheral edema  MS: moves all extremities without noticeable abnormality  PSYCH: pleasant and cooperative, no obvious depression or anxiety  ASSESSMENT AND PLAN:  Discussed the following assessment and plan:  Polypharmacy  Fatigue  -fatigue that improved when she decreased her trazadone, she wants to come off some of her psych meds and I think this is a good plan -have advised a taper off of the trazadone and advised her to discuss tapering off some of her other sedating pscyh meds with her psychiatrist (imipramine, ativan, ? Celexa) -advised and offered basic labs (CMP, CBC, tSH, Vit D) she reports her oncologists check these and prefers to get these done there as she is not having any other symptoms -follow up in 2-3 months -25 minutes spent with this patient -Patient advised to return or notify a doctor immediately if symptoms worsen or persist or new concerns arise.  Patient Instructions  Trazadone taper:  -decreased to 200mg  (2 tablets) for 1 weeks -then 150mg  (1.5 tablets) for 1 weeks -then 1 tablet for 1 week -then  1/2 tablet for 1 week, then stop   Talk to your psychiatrist about try to also wean down on ativan and imipramine  Follow up with your cancer doctor for labs  Follow up with me in 2 months or sooner if concerns     Stephanie Dickerson, Dahlia Client R.

## 2012-08-09 NOTE — Patient Instructions (Signed)
Trazadone taper:  -decreased to 200mg  (2 tablets) for 1 weeks -then 150mg  (1.5 tablets) for 1 weeks -then 1 tablet for 1 week -then  1/2 tablet for 1 week, then stop   Talk to your psychiatrist about try to also wean down on ativan and imipramine  Follow up with your cancer doctor for labs  Follow up with me in 2 months or sooner if concerns

## 2012-08-22 DIAGNOSIS — F3342 Major depressive disorder, recurrent, in full remission: Secondary | ICD-10-CM | POA: Diagnosis not present

## 2012-10-11 ENCOUNTER — Encounter: Payer: Self-pay | Admitting: Family Medicine

## 2012-10-11 ENCOUNTER — Ambulatory Visit (INDEPENDENT_AMBULATORY_CARE_PROVIDER_SITE_OTHER): Payer: Medicare Other | Admitting: Family Medicine

## 2012-10-11 VITALS — BP 120/74 | Temp 98.2°F | Wt 190.0 lb

## 2012-10-11 DIAGNOSIS — I10 Essential (primary) hypertension: Secondary | ICD-10-CM | POA: Diagnosis not present

## 2012-10-11 DIAGNOSIS — R739 Hyperglycemia, unspecified: Secondary | ICD-10-CM

## 2012-10-11 DIAGNOSIS — F411 Generalized anxiety disorder: Secondary | ICD-10-CM | POA: Diagnosis not present

## 2012-10-11 DIAGNOSIS — R7309 Other abnormal glucose: Secondary | ICD-10-CM | POA: Diagnosis not present

## 2012-10-11 DIAGNOSIS — E785 Hyperlipidemia, unspecified: Secondary | ICD-10-CM

## 2012-10-11 DIAGNOSIS — F419 Anxiety disorder, unspecified: Secondary | ICD-10-CM

## 2012-10-11 NOTE — Progress Notes (Signed)
Chief Complaint  Patient presents with  . 2-3 month follow up    HPI:  Follow up:  Polypharm: -fatigue thought to be related to many sedating meds -followed by psych -weaned of trazadone last visit, advised she discuss weaning of some of her other sedating psych meds with psych -she spoke with her psychiatrist and she has weaned down significantly on the trazadone to 1 tablet at bedtime and she is weaning off of the imipramine completely -she has noticed she feels much better  HTN: -on losartan and norvasc -denies: Cp, DOE, swelling, palpitations  HLD: takes pravastatin -stable  Osteopenia/hx breast ca: -followed by onc, Dr. Sheliah Hatch  OA/chronic back pain: -followed by Dr. Sedonia Small   ROS: See pertinent positives and negatives per HPI.  Past Medical History  Diagnosis Date  . Arthritis   . Chicken pox   . Hyperlipidemia   . Osteopenia   . Hypertension   . Cancer     Breast cancer-Right  . Depression     Family History  Problem Relation Age of Onset  . Lung cancer      husband  . Bone cancer Mother   . Leukemia Father     History   Social History  . Marital Status: Widowed    Spouse Name: N/A    Number of Children: N/A  . Years of Education: N/A   Social History Main Topics  . Smoking status: Former Games developer  . Smokeless tobacco: None  . Alcohol Use: Yes     Comment: per pt a glass of white wine before dinner   . Drug Use: None  . Sexually Active: Yes    Birth Control/ Protection: Post-menopausal   Other Topics Concern  . None   Social History Narrative  . None    Current outpatient prescriptions:alendronate (FOSAMAX) 35 MG tablet, Take 1 tablet (35 mg total) by mouth every 7 (seven) days. Take with a full glass of water on an empty stomach., Disp: 12 tablet, Rfl: 6;  amLODipine (NORVASC) 10 MG tablet, TAKE 1 TABLET (10 MG TOTAL) BY MOUTH DAILY., Disp: 30 tablet, Rfl: 5;  anastrozole (ARIMIDEX) 1 MG tablet, TAKE 1 TABLET (1 MG TOTAL)  BY MOUTH DAILY., Disp: 30 tablet, Rfl: 5 aspirin 81 MG tablet, Take 81 mg by mouth daily., Disp: , Rfl: ;  atorvastatin (LIPITOR) 80 MG tablet, TAKE 1/2 TABLET BY MOUTH ONCE DAILY., Disp: 45 tablet, Rfl: 2;  calcium carbonate (OS-CAL) 600 MG TABS, Take 600 mg by mouth 2 (two) times daily with a meal., Disp: , Rfl: ;  celecoxib (CELEBREX) 200 MG capsule, Take 200 mg by mouth 2 (two) times daily., Disp: , Rfl: ;  citalopram (CELEXA) 20 MG tablet, , Disp: , Rfl:  glucosamine-chondroitin 500-400 MG tablet, Take 1 tablet by mouth 3 (three) times daily., Disp: , Rfl: ;  imipramine (TOFRANIL) 50 MG tablet, Take 75 mg by mouth at bedtime. , Disp: , Rfl: ;  LORazepam (ATIVAN) 1 MG tablet, Take 1 mg by mouth every 8 (eight) hours., Disp: , Rfl: ;  losartan (COZAAR) 100 MG tablet, Take 1 tablet (100 mg total) by mouth daily., Disp: 90 tablet, Rfl: 2;  LUMIGAN 0.01 % SOLN, , Disp: , Rfl:  Multiple Vitamins-Minerals (ICAPS) CAPS, Take 1 capsule by mouth., Disp: , Rfl: ;  traZODone (DESYREL) 100 MG tablet, Take 100 mg by mouth at bedtime., Disp: , Rfl: ;  Casanthranol-Docusate Sodium 30-100 MG CAPS, Take 2 tablets by mouth., Disp: , Rfl: ;  methocarbamol (ROBAXIN) 500 MG tablet, Take 500 mg by mouth 4 (four) times daily., Disp: , Rfl:   EXAM:  Filed Vitals:   10/11/12 1256  BP: 120/74  Temp: 98.2 F (36.8 C)    Body mass index is 33.67 kg/(m^2).  GENERAL: vitals reviewed and listed above, alert, oriented, appears well hydrated and in no acute distress  HEENT: atraumatic, conjunttiva clear, no obvious abnormalities on inspection of external nose and ears  NECK: no obvious masses on inspection  LUNGS: clear to auscultation bilaterally, no wheezes, rales or rhonchi, good air movement  CV: HRRR, no peripheral edema  MS: moves all extremities without noticeable abnormality  PSYCH: pleasant and cooperative, no obvious depression or anxiety  ASSESSMENT AND PLAN:  Discussed the following assessment and  plan:  Hyperlipidemia  Hypertension  Hyperglycemia  Anxiety/Insomnia - followed by Orion Modest, psych nurse  -she is doing great -encouraged her to continue to work with psych to wean off medications -reviewed last labs which looked good, she is not fasting today and prefers fasting labs, will have her follow up in 3 months for preventive visit and labs at a morning appt -Patient advised to return or notify a doctor immediately if symptoms worsen or persist or new concerns arise.  Patient Instructions  -continue to work with your psychiatrist to wean off of sedating medications  -follow up in September for a physical exam - morning appointment and fasting labs     Stephanie Dickerson R.

## 2012-10-11 NOTE — Patient Instructions (Signed)
-  continue to work with your psychiatrist to wean off of sedating medications  -follow up in September for a physical exam - morning appointment and fasting labs

## 2012-10-12 ENCOUNTER — Other Ambulatory Visit: Payer: Self-pay | Admitting: Family Medicine

## 2012-11-20 DIAGNOSIS — Z1231 Encounter for screening mammogram for malignant neoplasm of breast: Secondary | ICD-10-CM | POA: Diagnosis not present

## 2012-11-21 DIAGNOSIS — F331 Major depressive disorder, recurrent, moderate: Secondary | ICD-10-CM | POA: Diagnosis not present

## 2012-11-23 ENCOUNTER — Other Ambulatory Visit: Payer: Self-pay | Admitting: Family Medicine

## 2012-12-13 DIAGNOSIS — H353190 Nonexudative age-related macular degeneration, unspecified eye, stage unspecified: Secondary | ICD-10-CM | POA: Diagnosis not present

## 2012-12-22 ENCOUNTER — Other Ambulatory Visit: Payer: Self-pay | Admitting: Family Medicine

## 2012-12-24 ENCOUNTER — Other Ambulatory Visit (HOSPITAL_BASED_OUTPATIENT_CLINIC_OR_DEPARTMENT_OTHER): Payer: Medicare Other | Admitting: Lab

## 2012-12-24 ENCOUNTER — Ambulatory Visit (HOSPITAL_BASED_OUTPATIENT_CLINIC_OR_DEPARTMENT_OTHER): Payer: Medicare Other | Admitting: Oncology

## 2012-12-24 ENCOUNTER — Encounter: Payer: Self-pay | Admitting: Oncology

## 2012-12-24 ENCOUNTER — Telehealth: Payer: Self-pay | Admitting: *Deleted

## 2012-12-24 VITALS — BP 124/67 | HR 93 | Temp 98.6°F | Resp 20 | Ht 63.0 in | Wt 188.0 lb

## 2012-12-24 DIAGNOSIS — C50419 Malignant neoplasm of upper-outer quadrant of unspecified female breast: Secondary | ICD-10-CM

## 2012-12-24 DIAGNOSIS — M8580 Other specified disorders of bone density and structure, unspecified site: Secondary | ICD-10-CM

## 2012-12-24 DIAGNOSIS — Z170 Estrogen receptor positive status [ER+]: Secondary | ICD-10-CM | POA: Diagnosis not present

## 2012-12-24 DIAGNOSIS — C801 Malignant (primary) neoplasm, unspecified: Secondary | ICD-10-CM

## 2012-12-24 LAB — CBC WITH DIFFERENTIAL/PLATELET
Basophils Absolute: 0 10*3/uL (ref 0.0–0.1)
Eosinophils Absolute: 0.1 10*3/uL (ref 0.0–0.5)
HGB: 12.6 g/dL (ref 11.6–15.9)
MONO#: 0.5 10*3/uL (ref 0.1–0.9)
NEUT#: 6.3 10*3/uL (ref 1.5–6.5)
RBC: 4.25 10*6/uL (ref 3.70–5.45)
RDW: 13.8 % (ref 11.2–14.5)
WBC: 8.6 10*3/uL (ref 3.9–10.3)
lymph#: 1.8 10*3/uL (ref 0.9–3.3)

## 2012-12-24 LAB — COMPREHENSIVE METABOLIC PANEL (CC13)
Albumin: 3.5 g/dL (ref 3.5–5.0)
Alkaline Phosphatase: 79 U/L (ref 40–150)
BUN: 30.2 mg/dL — ABNORMAL HIGH (ref 7.0–26.0)
CO2: 27 mEq/L (ref 22–29)
Calcium: 10 mg/dL (ref 8.4–10.4)
Chloride: 107 mEq/L (ref 98–109)
Glucose: 75 mg/dl (ref 70–140)
Potassium: 5 mEq/L (ref 3.5–5.1)
Sodium: 143 mEq/L (ref 136–145)
Total Protein: 6.6 g/dL (ref 6.4–8.3)

## 2012-12-24 NOTE — Patient Instructions (Signed)
You're doing well. There is no evidence of recurrent cancer.  Continue Arimidex 1 mg daily.  I will see you back in 6-8 months time for followup

## 2012-12-24 NOTE — Progress Notes (Signed)
OFFICE PROGRESS NOTE  CC  Terressa Koyanagi., DO [address] [place] [address]  DIAGNOSIS:  Stephanie Dickerson is a 79 year old [place] woman who underwent a screening mammogram on 11/06/2007, which showed a spiculated 1.5 cm isodense mass upper outer quadrant at 10 o'clock position 4 cm from the nipple.  Ultrasound confirmed the presence of a 1.8 cm mass.  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 The mass was low-grade, ER + 99%, PR + at 100%, proliferative index 8%, HER-2 was 1+.  Breast specific gamma imaging performed on 11/07/2007 showed the previously mentioned activity at similar location but the maximum uptake was about 1 cm.    PRIOR THERAPY: The patient elected to undergo a right breast lumpectomy and sentinel lymph node evaluation on 11/28/2007.  Final pathology showed a 1.7 cm invasive ductal carcinoma, grade I of III, DCIS low grade with negative surgical margins.  3 sentinel lymph nodes were all negative as well.  She declined radiation therapy and started Tamoxifen in 01/2008.  She took Tamoxifen for 4 to 5 years and was switched to Arimidex.    CURRENT THERAPY:  Arimidex 1 mg daily.  INTERVAL HISTORY: Stephanie Dickerson 79 y.o. female returns for continued follow up.  She is tolerating Arimidex well with no hot flashes or vaginal dryness.  She reports intermittent joint pain that she relates to arthritis, carpal tunnel in her right wrist, and left ankle fracture in the past.  ."  She reports a decrease in mobility due to her arthritis and she uses a cane when walking for support.    MEDICAL HISTORY: Past Medical History  Diagnosis Date  . Arthritis   . Chicken pox   . Hyperlipidemia   . Osteopenia   . Hypertension   . Cancer     Breast cancer-Right  . Depression     ALLERGIES:  has No Known Allergies.  MEDICATIONS:  Current Outpatient Prescriptions  Medication Sig  Dispense Refill  . alendronate (FOSAMAX) 35 MG tablet Take 1 tablet (35 mg total) by mouth every 7 (seven) days. Take with a full glass of water on an empty stomach.  12 tablet  6  . amLODipine (NORVASC) 10 MG tablet TAKE 1 TABLET (10 MG TOTAL) BY MOUTH DAILY.  90 tablet  3  . anastrozole (ARIMIDEX) 1 MG tablet TAKE 1 TABLET (1 MG TOTAL) BY MOUTH DAILY.  30 tablet  5  . aspirin 81 MG tablet Take 81 mg by mouth daily.      Marland Kitchen atorvastatin (LIPITOR) 80 MG tablet TAKE 1/2 TABLET BY MOUTH ONCE DAILY.  45 tablet  2  . calcium carbonate (OS-CAL) 600 MG TABS Take 600 mg by mouth 2 (two) times daily with a meal.      . Casanthranol-Docusate Sodium 30-100 MG CAPS Take 2 tablets by mouth.      . celecoxib (CELEBREX) 200 MG capsule Take 200 mg by mouth 2 (two) times daily.      . citalopram (CELEXA) 20 MG tablet       . GLUCOSAMINE-CHONDROITIN PO Take by mouth daily.      Marland Kitchen imipramine (TOFRANIL) 50 MG tablet Take 75 mg by mouth at bedtime.       Marland Kitchen LORazepam (ATIVAN) 1 MG tablet Take 1 mg by mouth every 8 (eight) hours.      Marland Kitchen losartan (COZAAR) 100 MG tablet TAKE 1 TABLET (100 MG TOTAL) BY MOUTH  DAILY.  90 tablet  1  . LUMIGAN 0.01 % SOLN       . Multiple Vitamins-Minerals (ICAPS) CAPS Take 1 capsule by mouth.      . Sodium Hyaluronate, Viscosup, (EUFLEXXA IX) Inject into the articular space every 6 (six) months.      . Travoprost (TRAVATAN OP) Apply to eye.      . traZODone (DESYREL) 100 MG tablet Take 100 mg by mouth at bedtime.      . methocarbamol (ROBAXIN) 500 MG tablet Take 500 mg by mouth 4 (four) times daily.       No current facility-administered medications for this visit.    SURGICAL HISTORY:  Past Surgical History  Procedure Laterality Date  . Tonsillectomy and adenoidectomy    . Rotator cuff surgery  2011  . Arthroscopy knee surgery      left  . Cataract surgery    . Breast surgery      Lumpectomy  . Spine surgery      REVIEW OF SYSTEMS:  Constitutional: Feels well.  Mildly  limited due to arthritis.  Cardiovascular: No chest pain or shortness of breath.  Intermittent bilateral ankle edema that she relates to taking Norvasc.  Pulmonary: No cough or wheeze.  Gastrointestinal: No nausea, vomiting, or diarrhea. No bright red blood per rectum or change in bowel movement.  Genitourinary: No frequency, urgency, or dysuria. No vaginal bleeding or discharge.  Musculoskeletal: No myalgia.  Intermittent joint pain. Neurologic: No weakness, numbness, or change in gait.  Psychology: No depression, anxiety, or insomnia  HEALTH MAINTENANCE: Mammogram:  11/23/11 Colonoscopy:  "Don't want another." Bone Density:  01/20/12 resulting low bone mass Pap Smear:  Managed by Dr. Eda Paschal in the past Eye Exam: Annually and now more frequently due to glaucoma in the right eye Vitamin D: 09/27/11 Lipid Panel: Managed by Dr. Selena Batten  PHYSICAL EXAMINATION: Blood pressure 124/67, pulse 93, temperature 98.6 F (37 C), temperature source Oral, resp. rate 20, height 5\' 3"  (1.6 m), weight 188 lb (85.276 kg). Body mass index is 33.31 kg/(m^2). ECOG PERFORMANCE STATUS: 1 - Symptomatic but completely ambulatory  General: Well developed, well nourished female in no acute distress. Alert and oriented x 3.  Head/Neck: Oropharynx clear.  Sclerae anicteric.  Supple without any enlargements.  Lymph node survey: No cervical, supraclavicular, or axillary adenopathy.  Cardiovascular: Regular rate and rhythm. S1 and S2 normal.  Mild systolic murmur noted.  Lungs: Clear to auscultation bilaterally. No wheezes/crackles/rhonchi noted.  Skin: No rashes or lesions present. Back: No CVA tenderness.  Abdomen: Abdomen soft, non-tender and obese. Active bowel sounds in all quadrants. No evidence of a fluid wave, organomegaly, or abdominal masses.  Breasts:  Right lumpectomy scar noted.  Inspection negative with no nodularity, masses, erythema, or discharge noted bilaterally. Extremities: No bilateral cyanosis or  clubbing.  Mild, non-pitting edema noted bilaterally.   LABORATORY DATA: Lab Results  Component Value Date   WBC 8.6 12/24/2012   HGB 12.6 12/24/2012   HCT 39.2 12/24/2012   MCV 92.2 12/24/2012   PLT 226 12/24/2012      Chemistry      Component Value Date/Time   NA 143 12/24/2012 1317   NA 140 09/27/2011 1331   K 5.0 12/24/2012 1317   K 4.7 09/27/2011 1331   CL 104 09/27/2011 1331   CO2 27 12/24/2012 1317   CO2 31 09/27/2011 1331   BUN 30.2* 12/24/2012 1317   BUN 26* 09/27/2011 1331   CREATININE 1.1 12/24/2012 1317  CREATININE 1.09 09/27/2011 1331      Component Value Date/Time   CALCIUM 10.0 12/24/2012 1317   CALCIUM 9.5 09/27/2011 1331   ALKPHOS 79 12/24/2012 1317   ALKPHOS 77 09/27/2011 1331   AST 21 12/24/2012 1317   AST 17 09/27/2011 1331   ALT 21 12/24/2012 1317   ALT 15 09/27/2011 1331   BILITOT 0.37 12/24/2012 1317   BILITOT 0.3 09/27/2011 1331      RADIOGRAPHIC STUDIES:  No results found.  ASSESSMENT: 79 year old [place] woman: #1  S/p right lumpectomy on 11/28/07 for T1c, N0 IDC with DCIS, Stage I, Grade 1, ER+ 99%, PR+ 100%, Ki67 8%, HER2 - 1+.    #2  She declined radiation therapy.  #3  She started Tamoxifen in 01/2008.  She took Tamoxifen for 4 to 5 years and was switched to Arimidex.    PLAN:    #1 continue Arimidex 1 mg daily.  #2 patient will be seen back in 6 months time for followup.    All questions were answered. The patient knows to call the clinic with any problems, questions or concerns. We can certainly see the patient much sooner if necessary.  I spent 15 minutes counseling the patient face to face. The total time spent in the appointment was 60 minutes.   Drue Second, MD Medical/Oncology [facility] [phone] (beeper) [phone] (Office)  12/24/2012, 2:47 PM

## 2012-12-24 NOTE — Telephone Encounter (Signed)
appts made and printed...td 

## 2013-01-04 ENCOUNTER — Ambulatory Visit (INDEPENDENT_AMBULATORY_CARE_PROVIDER_SITE_OTHER): Payer: Medicare Other | Admitting: Family Medicine

## 2013-01-04 ENCOUNTER — Encounter: Payer: Self-pay | Admitting: Family Medicine

## 2013-01-04 VITALS — BP 122/70 | Temp 98.5°F | Wt 184.0 lb

## 2013-01-04 DIAGNOSIS — F419 Anxiety disorder, unspecified: Secondary | ICD-10-CM

## 2013-01-04 DIAGNOSIS — E785 Hyperlipidemia, unspecified: Secondary | ICD-10-CM

## 2013-01-04 DIAGNOSIS — Z23 Encounter for immunization: Secondary | ICD-10-CM | POA: Diagnosis not present

## 2013-01-04 DIAGNOSIS — R7309 Other abnormal glucose: Secondary | ICD-10-CM | POA: Diagnosis not present

## 2013-01-04 DIAGNOSIS — I10 Essential (primary) hypertension: Secondary | ICD-10-CM | POA: Diagnosis not present

## 2013-01-04 DIAGNOSIS — R7303 Prediabetes: Secondary | ICD-10-CM

## 2013-01-04 DIAGNOSIS — R739 Hyperglycemia, unspecified: Secondary | ICD-10-CM

## 2013-01-04 DIAGNOSIS — Z0000 Encounter for general adult medical examination without abnormal findings: Secondary | ICD-10-CM

## 2013-01-04 DIAGNOSIS — F411 Generalized anxiety disorder: Secondary | ICD-10-CM

## 2013-01-04 LAB — HEMOGLOBIN A1C: Hgb A1c MFr Bld: 6.1 % (ref 4.6–6.5)

## 2013-01-04 LAB — LIPID PANEL
LDL Cholesterol: 75 mg/dL (ref 0–99)
Total CHOL/HDL Ratio: 2
VLDL: 10.4 mg/dL (ref 0.0–40.0)

## 2013-01-04 NOTE — Patient Instructions (Signed)
   Flu vaccine done today   Alcohol screening: discussed     Obesity Screening and counseling: discussed     STI screening : N/A     Tobacco Screening: not a smoker       Pneumococcal ASSESSMENT/PLAN: done      Screening mammograph (yearly if >40) ASSESSMENT/PLAN: gets yearly      Screening Pap smear/pelvic exam (q2 years) ASSESSMENT/PLAN: aged our, not applicable      Colorectal cancer screening (FOBT yearly or flex sig q4y or colonoscopy q10y or barium enema q4y) ASSESSMENT/PLAN: N/A      Bone mass measurements(covered q2y if indicated - estrogen def, osteoporosis, hyperparathyroid, vertebral abnormalities, osteoporosis or steroids) ASSESSMENT/PLAN: managed by her oncologist      Screening for glaucoma(q1y if high risk - diabetes, FH, AA and > 50 or hispanic and > 65) ASSESSMENT/PLAN:sees eye doctor, Dr. Hazle Quant and treated      Cardiovascular screening blood tests (lipids q5y) ASSESSMENT/PLAN: we will do this today      Diabetes screening tests ASSESSMENT/PLAN: we will do this today

## 2013-01-04 NOTE — Progress Notes (Signed)
Medicare Annual Preventive Care Visit  (initial annual wellness or annual wellness exam)  Other concerns/issues addressed today:  A. Polypharmacy: -when pt started seeing me recently was on many medications and felt sedated - wanted to stop some meds -she is followed by psych, has weaned down on trazadone  -feels better -for her Depression and Anxiety: she takes celexa, ativan, trazadone and imipramine all prescribed by psych - grandson had stroke and she recently had some anxiety issues and saw psych  B.HTN: -on losartan and norvasc, asa -denies: CP, SOB, DOE, swelling  C.HLD: -on lipitor -stable  Osteopenia/hx breast ca: -followed by onc, reviewed recent notes - had CBC and CMP -she is to continue Arimidex -takes calcium and fosamax   1.) Patient-completed health risk assessment  - completed and reviewed, see scanned documentation  2.) Review of Medical History: -PMH, PSH, Family History and current specialty and care providers reviewed and updated and listed below  - see chart and below  3.) Review of functional ability and level of safety:  Any difficulty hearing? NO  History of falling? NO  Any trouble with IADLs - using a phone, using transportation, grocery shopping, preparing meals, doing housework, doing laundry, taking medications and managing money? NO  Advance Directives? YES   See summary of recommendations in Patient Instructions below.  4.) Physical Exam Filed Vitals:   01/04/13 1102  BP: 122/70  Temp: 98.5 F (36.9 C)   Estimated body mass index is 32.6 kg/(m^2) as calculated from the following:   Height as of 12/24/12: 5\' 3"  (1.6 m).   Weight as of this encounter: 184 lb (83.462 kg).  Mini Cog: 1. Patient instructed to listen carefully and repeat the following: Apple Watch    Penny  2. Clock drawing test was administered: NORMAL       3. Recall of three words: 3/3  Scoring:   Patient Score: NEG    See patient instructions for  recommendations.  4)The following written screening schedule of preventive measures were reviewed with assessment and plan made per below, orders and patient instructions:         Alcohol screening: discussed     Obesity Screening and counseling: discussed     STI screening : N/A     Tobacco Screening: not a smoker       Pneumococcal ASSESSMENT/PLAN: done      Screening mammograph (yearly if >40) ASSESSMENT/PLAN: gets yearly      Screening Pap smear/pelvic exam (q2 years) ASSESSMENT/PLAN: aged our, not applicable      Colorectal cancer screening (FOBT yearly or flex sig q4y or colonoscopy q10y or barium enema q4y) ASSESSMENT/PLAN: N/A      Bone mass measurements(covered q2y if indicated - estrogen def, osteoporosis, hyperparathyroid, vertebral abnormalities, osteoporosis or steroids) ASSESSMENT/PLAN: managed by her oncologist      Screening for glaucoma(q1y if high risk - diabetes, FH, AA and > 50 or hispanic and > 65) ASSESSMENT/PLAN:sees eye doctor, Dr. Hazle Quant and treated      Cardiovascular screening blood tests (lipids q5y) ASSESSMENT/PLAN: we will do this today      Diabetes screening tests ASSESSMENT/PLAN: we will do this today   7.) Summary: -risk factors and conditions per above assessment were discussed and treatment, recommendations and referrals were offered per documentation above and orders and patient instructions.

## 2013-01-04 NOTE — Progress Notes (Signed)
Quick Note:  Called and spoke with pt and pt is aware. ______ 

## 2013-01-15 DIAGNOSIS — M1710 Unilateral primary osteoarthritis, unspecified knee: Secondary | ICD-10-CM | POA: Diagnosis not present

## 2013-01-22 DIAGNOSIS — M1710 Unilateral primary osteoarthritis, unspecified knee: Secondary | ICD-10-CM | POA: Diagnosis not present

## 2013-01-29 DIAGNOSIS — M1710 Unilateral primary osteoarthritis, unspecified knee: Secondary | ICD-10-CM | POA: Diagnosis not present

## 2013-02-01 ENCOUNTER — Encounter: Payer: Self-pay | Admitting: Family Medicine

## 2013-02-07 ENCOUNTER — Encounter: Payer: Self-pay | Admitting: Internal Medicine

## 2013-02-07 ENCOUNTER — Ambulatory Visit (INDEPENDENT_AMBULATORY_CARE_PROVIDER_SITE_OTHER): Payer: Medicare Other | Admitting: Internal Medicine

## 2013-02-07 ENCOUNTER — Ambulatory Visit (INDEPENDENT_AMBULATORY_CARE_PROVIDER_SITE_OTHER)
Admission: RE | Admit: 2013-02-07 | Discharge: 2013-02-07 | Disposition: A | Discharge status: Home / Self Care | Payer: Medicare Other | Source: Ambulatory Visit | Attending: Internal Medicine | Admitting: Internal Medicine

## 2013-02-07 VITALS — BP 114/60 | HR 85 | Temp 97.3°F | Wt 191.0 lb

## 2013-02-07 DIAGNOSIS — M1990 Unspecified osteoarthritis, unspecified site: Secondary | ICD-10-CM

## 2013-02-07 DIAGNOSIS — M25539 Pain in unspecified wrist: Secondary | ICD-10-CM | POA: Diagnosis not present

## 2013-02-07 DIAGNOSIS — M129 Arthropathy, unspecified: Secondary | ICD-10-CM | POA: Diagnosis not present

## 2013-02-07 DIAGNOSIS — M25532 Pain in left wrist: Secondary | ICD-10-CM

## 2013-02-07 DIAGNOSIS — M19039 Primary osteoarthritis, unspecified wrist: Secondary | ICD-10-CM | POA: Diagnosis not present

## 2013-02-07 NOTE — Patient Instructions (Addendum)
This acts like either a tendinitis or arthritis of the wrist thumb joint area.   Wrist support and  Increase celebrex to twice a day for 7 days  ( gi precautions)  Get x ray today and will let you know results .  If  persistent or progressive contact us and we may have orthopedics or sports med see you about further treatments

## 2013-02-07 NOTE — Progress Notes (Signed)
Chief Complaint  Patient presents with  . Wrist Pain    Pain started a week ago.  Has been wearing a brace on her left arm.  Has not taken anything for pain.  . Osteoarthritis  . Arthritis    HPI: Patient comes in today for SDA for  new problem evaluation. PCP NA About 10 days hx of left srist base of thumb and radial pain no hx of same but does have OA and on celebrex. No falling and is right handed .   Does use left hand to push up handle about of chair that could trigger this?  No fever or fall. Using  Right wrist support on left  And doesn't fit but used it last night .  No numbens or weakness twisting motions are worse ROS: See pertinent positives and negatives per HPI. No bleeding falling   Past Medical History  Diagnosis Date  . Arthritis   . Chicken pox   . Hyperlipidemia   . Osteopenia   . Hypertension   . Cancer     Breast cancer-Right  . Depression     Family History  Problem Relation Age of Onset  . Lung cancer      husband  . Bone cancer Mother   . Leukemia Father     History   Social History  . Marital Status: Widowed    Spouse Name: N/A    Number of Children: N/A  . Years of Education: N/A   Social History Main Topics  . Smoking status: Former Games developer  . Smokeless tobacco: None  . Alcohol Use: Yes     Comment: per pt a glass of white wine before dinner   . Drug Use: None  . Sexual Activity: Yes    Birth Control/ Protection: Post-menopausal   Other Topics Concern  . None   Social History Narrative  . None    Outpatient Encounter Prescriptions as of 02/07/2013  Medication Sig Dispense Refill  . alendronate (FOSAMAX) 35 MG tablet Take 1 tablet (35 mg total) by mouth every 7 (seven) days. Take with a full glass of water on an empty stomach.  12 tablet  6  . amLODipine (NORVASC) 10 MG tablet TAKE 1 TABLET (10 MG TOTAL) BY MOUTH DAILY.  90 tablet  3  . anastrozole (ARIMIDEX) 1 MG tablet TAKE 1 TABLET (1 MG TOTAL) BY MOUTH DAILY.  30 tablet  5  .  aspirin 81 MG tablet Take 81 mg by mouth daily.      Marland Kitchen atorvastatin (LIPITOR) 80 MG tablet TAKE 1/2 TABLET BY MOUTH ONCE DAILY.  45 tablet  2  . calcium carbonate (OS-CAL) 600 MG TABS Take 600 mg by mouth 2 (two) times daily with a meal.      . celecoxib (CELEBREX) 200 MG capsule Take 200 mg by mouth daily.       . citalopram (CELEXA) 20 MG tablet       . GLUCOSAMINE-CHONDROITIN PO Take by mouth daily.      Marland Kitchen imipramine (TOFRANIL) 50 MG tablet Take 100 mg by mouth at bedtime.       Marland Kitchen LORazepam (ATIVAN) 1 MG tablet Take 1 mg by mouth at bedtime.       Marland Kitchen losartan (COZAAR) 100 MG tablet TAKE 1 TABLET (100 MG TOTAL) BY MOUTH DAILY.  90 tablet  1  . methocarbamol (ROBAXIN) 500 MG tablet Take 500 mg by mouth daily as needed.       . Multiple Vitamins-Minerals (  ICAPS) CAPS Take 1 capsule by mouth.      . Sodium Hyaluronate, Viscosup, (EUFLEXXA IX) Inject into the articular space every 6 (six) months.      . Travoprost (TRAVATAN OP) Apply to eye.      . traZODone (DESYREL) 100 MG tablet Take 100 mg by mouth at bedtime.      . [DISCONTINUED] Casanthranol-Docusate Sodium 30-100 MG CAPS Take 2 tablets by mouth.      . [DISCONTINUED] LUMIGAN 0.01 % SOLN        No facility-administered encounter medications on file as of 02/07/2013.    EXAM:  BP 114/60  Pulse 85  Temp(Src) 97.3 F (36.3 C) (Oral)  Wt 191 lb (86.637 kg)  BMI 33.84 kg/m2  SpO2 96%  Body mass index is 33.84 kg/(m^2).  GENERAL: vitals reviewed and listed above, alert, oriented, appears well hydrated and in no acute distress  MS: moves all extremities  Walks with cane   Left wrist in ill fitting support  Left hand some swelling at thenar ?area   Non tenderness  snuff box Pain at base of thumb and radial head area no deformity except arthritis changes skin no rash or redness pulses intact . PSYCH: pleasant and cooperative, no obvious depression or anxiety  ASSESSMENT AND PLAN:  Discussed the following assessment and  plan:  Wrist pain, acute, left - no injury ? poss tendinints overuse  vs arthritis  some swelling at joint  - Plan: DG Wrist Complete Left  Osteoarthritis - followed by Dr. Sedonia Small in Ortho - Plan: DG Wrist Complete Left  Arthritis Wrist support .  -Patient advised to return or notify health care team  if symptoms worsen or persist or new concerns arise.  Patient Instructions  This acts like either a tendinitis or arthritis of the wrist thumb joint area.   Wrist support and  Increase celebrex to twice a day for 7 days  ( gi precautions)  Get x ray today and will let you know results .  If  persistent or progressive contact us and we may have orthopedics or sports med see you about further treatments       Burna Mortimer K. Blong Busk M.D.

## 2013-02-08 ENCOUNTER — Encounter: Payer: Self-pay | Admitting: Family Medicine

## 2013-02-21 ENCOUNTER — Other Ambulatory Visit: Payer: Self-pay | Admitting: *Deleted

## 2013-02-21 DIAGNOSIS — C50919 Malignant neoplasm of unspecified site of unspecified female breast: Secondary | ICD-10-CM

## 2013-02-21 DIAGNOSIS — C50911 Malignant neoplasm of unspecified site of right female breast: Secondary | ICD-10-CM

## 2013-02-21 DIAGNOSIS — E559 Vitamin D deficiency, unspecified: Secondary | ICD-10-CM

## 2013-02-21 MED ORDER — ANASTROZOLE 1 MG PO TABS: ORAL_TABLET | ORAL | Status: DC

## 2013-04-06 ENCOUNTER — Other Ambulatory Visit: Payer: Self-pay | Admitting: Family Medicine

## 2013-04-08 DIAGNOSIS — H4011X0 Primary open-angle glaucoma, stage unspecified: Secondary | ICD-10-CM | POA: Diagnosis not present

## 2013-04-08 DIAGNOSIS — H409 Unspecified glaucoma: Secondary | ICD-10-CM | POA: Diagnosis not present

## 2013-04-10 DIAGNOSIS — F3342 Major depressive disorder, recurrent, in full remission: Secondary | ICD-10-CM | POA: Diagnosis not present

## 2013-04-26 ENCOUNTER — Other Ambulatory Visit: Payer: Self-pay | Admitting: Oncology

## 2013-06-03 ENCOUNTER — Encounter: Payer: Self-pay | Admitting: Family Medicine

## 2013-06-03 ENCOUNTER — Ambulatory Visit (INDEPENDENT_AMBULATORY_CARE_PROVIDER_SITE_OTHER): Payer: Medicare Other | Admitting: Family Medicine

## 2013-06-03 VITALS — BP 150/80 | Temp 98.8°F | Wt 191.0 lb

## 2013-06-03 DIAGNOSIS — J329 Chronic sinusitis, unspecified: Secondary | ICD-10-CM | POA: Diagnosis not present

## 2013-06-03 MED ORDER — AMOXICILLIN 875 MG PO TABS: 875.0000 mg | ORAL_TABLET | Freq: Two times a day (BID) | ORAL | Status: DC

## 2013-06-03 NOTE — Progress Notes (Signed)
Chief Complaint  Patient presents with  . Sinusitis    HPI:  Acute visit for sinus congestion: -started: 3 weeks ago , then worsened about 1 week ago -symptoms:nasal congestion, sore throat, cough, max tooth pain - saw her dentist and he told her she has a sinus infection -denies:fever, SOB, NVD -sick contacts/travel/risks: denies flu exposure or Ebola risks, hx of sinusitia -Hx of: allergies  ROS: See pertinent positives and negatives per HPI.  Past Medical History  Diagnosis Date  . Arthritis   . Chicken pox   . Hyperlipidemia   . Osteopenia   . Hypertension   . Cancer     Breast cancer-Right  . Depression     Past Surgical History  Procedure Laterality Date  . Tonsillectomy and adenoidectomy    . Rotator cuff surgery  2011  . Arthroscopy knee surgery      left  . Cataract surgery    . Breast surgery      Lumpectomy  . Spine surgery      Family History  Problem Relation Age of Onset  . Lung cancer      husband  . Bone cancer Mother   . Leukemia Father     History   Social History  . Marital Status: Widowed    Spouse Name: N/A    Number of Children: N/A  . Years of Education: N/A   Social History Main Topics  . Smoking status: Former Research scientist (life sciences)  . Smokeless tobacco: None  . Alcohol Use: Yes     Comment: per pt a glass of white wine before dinner   . Drug Use: None  . Sexual Activity: Yes    Birth Control/ Protection: Post-menopausal   Other Topics Concern  . None   Social History Narrative  . None    Current outpatient prescriptions:alendronate (FOSAMAX) 35 MG tablet, Take 1 tablet (35 mg total) by mouth every 7 (seven) days. Take with a full glass of water on an empty stomach., Disp: 12 tablet, Rfl: 6;  amLODipine (NORVASC) 10 MG tablet, TAKE 1 TABLET (10 MG TOTAL) BY MOUTH DAILY., Disp: 90 tablet, Rfl: 3;  anastrozole (ARIMIDEX) 1 MG tablet, TAKE 1 TABLET (1 MG TOTAL) BY MOUTH DAILY., Disp: 30 tablet, Rfl: 4 aspirin 81 MG tablet, Take 81 mg by  mouth daily., Disp: , Rfl: ;  atorvastatin (LIPITOR) 80 MG tablet, TAKE 1/2 TABLET BY MOUTH ONCE DAILY., Disp: 45 tablet, Rfl: 2;  calcium carbonate (OS-CAL) 600 MG TABS, Take 600 mg by mouth 2 (two) times daily with a meal., Disp: , Rfl: ;  celecoxib (CELEBREX) 200 MG capsule, Take 200 mg by mouth daily. , Disp: , Rfl: ;  citalopram (CELEXA) 20 MG tablet, , Disp: , Rfl:  GLUCOSAMINE-CHONDROITIN PO, Take by mouth daily., Disp: , Rfl: ;  imipramine (TOFRANIL) 50 MG tablet, Take 100 mg by mouth at bedtime. , Disp: , Rfl: ;  LORazepam (ATIVAN) 1 MG tablet, Take 1 mg by mouth at bedtime. , Disp: , Rfl: ;  losartan (COZAAR) 100 MG tablet, TAKE 1 TABLET (100 MG TOTAL) BY MOUTH DAILY., Disp: 90 tablet, Rfl: 0;  methocarbamol (ROBAXIN) 500 MG tablet, Take 500 mg by mouth daily as needed. , Disp: , Rfl:  Multiple Vitamins-Minerals (ICAPS) CAPS, Take 1 capsule by mouth., Disp: , Rfl: ;  Sodium Hyaluronate, Viscosup, (EUFLEXXA IX), Inject into the articular space every 6 (six) months., Disp: , Rfl: ;  Travoprost (TRAVATAN OP), Apply to eye., Disp: , Rfl: ;  traZODone (DESYREL) 100 MG tablet, Take 100 mg by mouth at bedtime., Disp: , Rfl:  amoxicillin (AMOXIL) 875 MG tablet, Take 1 tablet (875 mg total) by mouth 2 (two) times daily., Disp: 20 tablet, Rfl: 0  EXAM:  Filed Vitals:   06/03/13 1355  BP: 150/80  Temp: 98.8 F (37.1 C)    Body mass index is 33.84 kg/(m^2).  GENERAL: vitals reviewed and listed above, alert, oriented, appears well hydrated and in no acute distress  HEENT: atraumatic, conjunttiva clear, no obvious abnormalities on inspection of external nose and ears, normal appearance of ear canals and TMs, clear nasal congestion, mild post oropharyngeal erythema with PND, no tonsillar edema or exudate, L max sinus TTP  NECK: no obvious masses on inspection  LUNGS: clear to auscultation bilaterally, no wheezes, rales or rhonchi, good air movement  CV: HRRR, no peripheral edema  MS: moves all  extremities without noticeable abnormality  PSYCH: pleasant and cooperative, no obvious depression or anxiety  ASSESSMENT AND PLAN:  Discussed the following assessment and plan:  Sinusitis - Plan: amoxicillin (AMOXIL) 875 MG tablet  dvised to return or notify a doctor immediately if symptoms worsen or persist or new concerns arise.    Patient Instructions  INSTRUCTIONS FOR UPPER RESPIRATORY INFECTION:  -plenty of rest and fluids  -As we discussed, we have prescribed a new medication for you at this appointment. We discussed the common and serious potential adverse effects of this medication and you can review these and more with the pharmacist when you pick up your medication.  Please follow the instructions for use carefully and notify us immediately if you have any problems taking this medication.  -nasal saline wash 2-3 times daily (use prepackaged nasal saline or bottled/distilled water if making your own)   -can use sinex or afrin nasal spray for drainage and nasal congestion - but do NOT use longer then 3-4 days  -can use tylenol or ibuprofen as directed for aches and sorethroat  -in the winter time, using a humidifier at night is helpful (please follow cleaning instructions)  -if you are taking a cough medication - use only as directed, may also try a teaspoon of honey to coat the throat and throat lozenges  -for sore throat, salt water gargles can help  -follow up if you have fevers, facial pain, tooth pain, difficulty breathing or are worsening or not getting better in 5-7 days      Machele Deihl R.

## 2013-06-03 NOTE — Progress Notes (Signed)
Pre visit review using our clinic review tool, if applicable. No additional management support is needed unless otherwise documented below in the visit note. 

## 2013-06-03 NOTE — Patient Instructions (Signed)
INSTRUCTIONS FOR UPPER RESPIRATORY INFECTION:  -plenty of rest and fluids  -As we discussed, we have prescribed a new medication for you at this appointment. We discussed the common and serious potential adverse effects of this medication and you can review these and more with the pharmacist when you pick up your medication.  Please follow the instructions for use carefully and notify us immediately if you have any problems taking this medication.  -nasal saline wash 2-3 times daily (use prepackaged nasal saline or bottled/distilled water if making your own)   -can use sinex or afrin nasal spray for drainage and nasal congestion - but do NOT use longer then 3-4 days  -can use tylenol or ibuprofen as directed for aches and sorethroat  -in the winter time, using a humidifier at night is helpful (please follow cleaning instructions)  -if you are taking a cough medication - use only as directed, may also try a teaspoon of honey to coat the throat and throat lozenges  -for sore throat, salt water gargles can help  -follow up if you have fevers, facial pain, tooth pain, difficulty breathing or are worsening or not getting better in 5-7 days

## 2013-06-10 DIAGNOSIS — IMO0002 Reserved for concepts with insufficient information to code with codable children: Secondary | ICD-10-CM | POA: Diagnosis not present

## 2013-06-10 DIAGNOSIS — M1710 Unilateral primary osteoarthritis, unspecified knee: Secondary | ICD-10-CM | POA: Diagnosis not present

## 2013-07-02 ENCOUNTER — Other Ambulatory Visit: Payer: Self-pay

## 2013-07-02 DIAGNOSIS — M8580 Other specified disorders of bone density and structure, unspecified site: Secondary | ICD-10-CM

## 2013-07-02 DIAGNOSIS — C50919 Malignant neoplasm of unspecified site of unspecified female breast: Secondary | ICD-10-CM

## 2013-07-02 MED ORDER — ALENDRONATE SODIUM 35 MG PO TABS: 35.0000 mg | ORAL_TABLET | ORAL | Status: DC

## 2013-08-01 DIAGNOSIS — IMO0002 Reserved for concepts with insufficient information to code with codable children: Secondary | ICD-10-CM | POA: Diagnosis not present

## 2013-08-01 DIAGNOSIS — M1710 Unilateral primary osteoarthritis, unspecified knee: Secondary | ICD-10-CM | POA: Diagnosis not present

## 2013-08-08 DIAGNOSIS — H409 Unspecified glaucoma: Secondary | ICD-10-CM | POA: Diagnosis not present

## 2013-08-08 DIAGNOSIS — H4011X0 Primary open-angle glaucoma, stage unspecified: Secondary | ICD-10-CM | POA: Diagnosis not present

## 2013-08-09 DIAGNOSIS — IMO0002 Reserved for concepts with insufficient information to code with codable children: Secondary | ICD-10-CM | POA: Diagnosis not present

## 2013-08-09 DIAGNOSIS — M1710 Unilateral primary osteoarthritis, unspecified knee: Secondary | ICD-10-CM | POA: Diagnosis not present

## 2013-08-19 DIAGNOSIS — M1710 Unilateral primary osteoarthritis, unspecified knee: Secondary | ICD-10-CM | POA: Diagnosis not present

## 2013-08-20 ENCOUNTER — Telehealth: Payer: Self-pay | Admitting: Oncology

## 2013-08-20 NOTE — Telephone Encounter (Signed)
, °

## 2013-08-26 ENCOUNTER — Ambulatory Visit: Payer: Medicare Other | Admitting: Oncology

## 2013-08-26 ENCOUNTER — Other Ambulatory Visit: Payer: Medicare Other

## 2013-09-03 ENCOUNTER — Other Ambulatory Visit (HOSPITAL_BASED_OUTPATIENT_CLINIC_OR_DEPARTMENT_OTHER): Payer: Medicare Other

## 2013-09-03 ENCOUNTER — Ambulatory Visit (HOSPITAL_BASED_OUTPATIENT_CLINIC_OR_DEPARTMENT_OTHER): Payer: Medicare Other | Admitting: Hematology and Oncology

## 2013-09-03 ENCOUNTER — Telehealth: Payer: Self-pay | Admitting: Hematology and Oncology

## 2013-09-03 VITALS — BP 146/68 | HR 97 | Temp 98.7°F | Resp 18 | Ht 63.0 in | Wt 191.0 lb

## 2013-09-03 DIAGNOSIS — C50419 Malignant neoplasm of upper-outer quadrant of unspecified female breast: Secondary | ICD-10-CM

## 2013-09-03 DIAGNOSIS — Z170 Estrogen receptor positive status [ER+]: Secondary | ICD-10-CM | POA: Diagnosis not present

## 2013-09-03 DIAGNOSIS — M8580 Other specified disorders of bone density and structure, unspecified site: Secondary | ICD-10-CM

## 2013-09-03 DIAGNOSIS — C801 Malignant (primary) neoplasm, unspecified: Secondary | ICD-10-CM

## 2013-09-03 DIAGNOSIS — C50919 Malignant neoplasm of unspecified site of unspecified female breast: Secondary | ICD-10-CM

## 2013-09-03 DIAGNOSIS — Z79811 Long term (current) use of aromatase inhibitors: Secondary | ICD-10-CM | POA: Diagnosis not present

## 2013-09-03 LAB — CBC WITH DIFFERENTIAL/PLATELET
BASO%: 0.5 % (ref 0.0–2.0)
Basophils Absolute: 0 10*3/uL (ref 0.0–0.1)
EOS ABS: 0.1 10*3/uL (ref 0.0–0.5)
EOS%: 0.7 % (ref 0.0–7.0)
HEMATOCRIT: 37.2 % (ref 34.8–46.6)
HGB: 12 g/dL (ref 11.6–15.9)
LYMPH#: 1.6 10*3/uL (ref 0.9–3.3)
LYMPH%: 17.6 % (ref 14.0–49.7)
MCH: 30.4 pg (ref 25.1–34.0)
MCHC: 32.2 g/dL (ref 31.5–36.0)
MCV: 94.5 fL (ref 79.5–101.0)
MONO#: 0.5 10*3/uL (ref 0.1–0.9)
MONO%: 5.9 % (ref 0.0–14.0)
NEUT%: 75.3 % (ref 38.4–76.8)
NEUTROS ABS: 7 10*3/uL — AB (ref 1.5–6.5)
Platelets: 217 10*3/uL (ref 145–400)
RBC: 3.94 10*6/uL (ref 3.70–5.45)
RDW: 14.1 % (ref 11.2–14.5)
WBC: 9.3 10*3/uL (ref 3.9–10.3)

## 2013-09-03 LAB — COMPREHENSIVE METABOLIC PANEL (CC13)
ALT: 18 U/L (ref 0–55)
AST: 22 U/L (ref 5–34)
Albumin: 3.8 g/dL (ref 3.5–5.0)
Alkaline Phosphatase: 83 U/L (ref 40–150)
Anion Gap: 10 mEq/L (ref 3–11)
BUN: 25.3 mg/dL (ref 7.0–26.0)
CALCIUM: 10.4 mg/dL (ref 8.4–10.4)
CHLORIDE: 107 meq/L (ref 98–109)
CO2: 27 meq/L (ref 22–29)
CREATININE: 1.2 mg/dL — AB (ref 0.6–1.1)
Glucose: 109 mg/dl (ref 70–140)
Potassium: 4.8 mEq/L (ref 3.5–5.1)
Sodium: 144 mEq/L (ref 136–145)
Total Bilirubin: 0.43 mg/dL (ref 0.20–1.20)
Total Protein: 6.4 g/dL (ref 6.4–8.3)

## 2013-09-03 NOTE — Telephone Encounter (Signed)
perpof to sch pt appts/pt stated will sch her own mamma & bone densiti-adv I would note the acct

## 2013-09-04 NOTE — Progress Notes (Signed)
OFFICE PROGRESS NOTE  CC  Lucretia Kern., DO [place] [id number]  Chief complaint: Follow up visit for breast cancer  DIAGNOSIS:  As per previously documented note:   Stephanie Dickerson is a 80 year old [place] woman who underwent a screening mammogram on 11/06/2007, which showed a spiculated 1.5 cm isodense mass upper outer quadrant at 10 o'clock position 4 cm from the nipple.  Ultrasound confirmed the presence of a 1.8 cm mass.  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 The mass was low-grade, ER + 99%, PR + at 100%, proliferative index 8%, HER-2 was 1+.  Breast specific gamma imaging performed on 11/07/2007 showed the previously mentioned activity at similar location but the maximum uptake was about 1 cm.    PRIOR THERAPY:  The patient elected to undergo a right breast lumpectomy and sentinel lymph node evaluation on 11/28/2007.  Final pathology showed a 1.7 cm invasive ductal carcinoma, grade I of III, DCIS low grade with negative surgical margins.  3 sentinel lymph nodes were all negative as well.  She declined radiation therapy and started Tamoxifen in 01/2008.  She took Tamoxifen for 2 years and was switched to Arimidex.    CURRENT THERAPY:  Arimidex 1 mg daily started in March 2011  INTERVAL HISTORY: Stephanie Dickerson 80 y.o. female returns for annual breast cancer followup. She complains of joint pains and at present she is waiting left knee brace. She is tolerating Arimidex well with no hot flashes or vaginal dryness.  She says a decrease in mobility due to her arthritis and she uses a cane when walking for support.  Last mammogram was in July 2014 and that revealed no mammographic evidence of any malignancy  Last bone densitometry in September 2013 that revealed low bone mass   MEDICAL HISTORY: Past Medical History  Diagnosis Date  . Arthritis   . Chicken pox    . Hyperlipidemia   . Osteopenia   . Hypertension   . Cancer     Breast cancer-Right  . Depression     ALLERGIES:  has No Known Allergies.  MEDICATIONS:  Current Outpatient Prescriptions  Medication Sig Dispense Refill  . alendronate (FOSAMAX) 35 MG tablet Take 1 tablet (35 mg total) by mouth every 7 (seven) days. Take with a full glass of water on an empty stomach.  12 tablet  3  . amLODipine (NORVASC) 10 MG tablet TAKE 1 TABLET (10 MG TOTAL) BY MOUTH DAILY.  90 tablet  3  . anastrozole (ARIMIDEX) 1 MG tablet TAKE 1 TABLET (1 MG TOTAL) BY MOUTH DAILY.  30 tablet  4  . aspirin 81 MG tablet Take 81 mg by mouth daily.      Marland Kitchen atorvastatin (LIPITOR) 80 MG tablet TAKE 1/2 TABLET BY MOUTH ONCE DAILY.  45 tablet  2  . calcium carbonate (OS-CAL) 600 MG TABS Take 600 mg by mouth 2 (two) times daily with a meal.      . celecoxib (CELEBREX) 200 MG capsule Take 200 mg by mouth daily.       . citalopram (CELEXA) 20 MG tablet       . GLUCOSAMINE-CHONDROITIN PO Take by mouth daily.      Marland Kitchen imipramine (TOFRANIL) 50 MG tablet Take 100 mg by mouth at bedtime.       Marland Kitchen LORazepam (ATIVAN) 1 MG tablet Take 1 mg by mouth at bedtime.       Marland Kitchen  losartan (COZAAR) 100 MG tablet TAKE 1 TABLET (100 MG TOTAL) BY MOUTH DAILY.  90 tablet  0  . methocarbamol (ROBAXIN) 500 MG tablet Take 500 mg by mouth daily as needed.       . Multiple Vitamins-Minerals (ICAPS) CAPS Take 1 capsule by mouth.      . Travoprost (TRAVATAN OP) Apply to eye.      . traZODone (DESYREL) 100 MG tablet Take 100 mg by mouth at bedtime.       No current facility-administered medications for this visit.    SURGICAL HISTORY:  Past Surgical History  Procedure Laterality Date  . Tonsillectomy and adenoidectomy    . Rotator cuff surgery  2011  . Arthroscopy knee surgery      left  . Cataract surgery    . Breast surgery      Lumpectomy  . Spine surgery      REVIEW OF SYSTEMS:  Constitutional: Feels well.  Mildly limited due to arthritis.   Cardiovascular: No chest pain or shortness of breath.  Intermittent bilateral ankle edema that she relates to taking Norvasc.  Pulmonary: No cough or wheeze.  Gastrointestinal: No nausea, vomiting, or diarrhea. No bright red blood per rectum or change in bowel movement.  Genitourinary: No frequency, urgency, or dysuria. No vaginal bleeding or discharge.  Musculoskeletal: No myalgia.  Intermittent joint pain. Neurologic: No weakness, numbness, or change in gait.  Psychology: No depression, anxiety, or insomnia  HEALTH MAINTENANCE: Mammogram:  10/2012 Colonoscopy:  "Don't want another." Bone Density:  01/20/12 resulting low bone mass Pap Smear:  Managed by Dr. Cherylann Banas in the past Eye Exam: Annually and now more frequently due to glaucoma in the right eye Lipid Panel: Managed by Dr. Maudie Mercury  PHYSICAL EXAMINATION: Blood pressure 146/68, pulse 97, temperature 98.7 F (37.1 C), temperature source Oral, resp. rate 18, height 5' 3"  (1.6 m), weight 191 lb (86.637 kg). Body mass index is 33.84 kg/(m^2). ECOG PERFORMANCE STATUS: 1 - Symptomatic but completely ambulatory  General: Well developed, well nourished female in no acute distress. Alert and oriented x 3.  Head/Neck: Oropharynx clear.  Sclerae anicteric.  Supple without any enlargements.  Lymph node survey: No cervical, supraclavicular, or axillary adenopathy.  Cardiovascular: Regular rate and rhythm. S1 and S2 normal.  Mild systolic murmur noted.  Lungs: Clear to auscultation bilaterally. No wheezes/crackles/rhonchi noted.  Skin: No rashes or lesions present. Back: No CVA tenderness.  Abdomen: Abdomen soft, non-tender and obese. Active bowel sounds in all quadrants. No evidence of a fluid wave, organomegaly, or abdominal masses.  Breasts:  Right lumpectomy scar noted.  No masses felt in either breast. Bilateral axillary areas no lymphadenopathy appreciated Extremities: No bilateral cyanosis or clubbing.  Mild, non-pitting edema noted  bilaterally.   LABORATORY DATA: Lab Results  Component Value Date   WBC 9.3 09/03/2013   HGB 12.0 09/03/2013   HCT 37.2 09/03/2013   MCV 94.5 09/03/2013   PLT 217 09/03/2013      Chemistry      Component Value Date/Time   NA 144 09/03/2013 1323   NA 140 09/27/2011 1331   K 4.8 09/03/2013 1323   K 4.7 09/27/2011 1331   CL 104 09/27/2011 1331   CO2 27 09/03/2013 1323   CO2 31 09/27/2011 1331   BUN 25.3 09/03/2013 1323   BUN 26* 09/27/2011 1331   CREATININE 1.2* 09/03/2013 1323   CREATININE 1.09 09/27/2011 1331      Component Value Date/Time   CALCIUM 10.4 09/03/2013 1323  CALCIUM 9.5 09/27/2011 1331   ALKPHOS 83 09/03/2013 1323   ALKPHOS 77 09/27/2011 1331   AST 22 09/03/2013 1323   AST 17 09/27/2011 1331   ALT 18 09/03/2013 1323   ALT 15 09/27/2011 1331   BILITOT 0.43 09/03/2013 1323   BILITOT 0.3 09/27/2011 1331       ASSESSMENT: 80 year old [place] woman: #1  S/p right lumpectomy on 11/28/07 for T1c, N0 IDC with DCIS, Stage I, Grade 1, ER+ 99%, PR+ 100%, Ki67 8%, HER2 - 1+.    #2  She declined radiation therapy.  #3  She started Tamoxifen in 01/2008.  She took Tamoxifen for 2 years and was switched to Arimidex in March 2011   PLAN:    #1 continue Arimidex 1 mg daily until March 2016 to complete 5 years of Arimidex  #2 patient will be seen back in 6 months time for followup.    All questions were answered. The patient knows to call the clinic with any problems, questions or concerns. We can certainly see the patient much sooner if necessary.  I spent 20 minutes counseling the patient face to face. The total time spent in the appointment was 30 minutes.   Wilmon Arms, MD Medical/Oncology [facility] [phone] (Office)  09/04/2013, 8:48 AM

## 2013-09-21 ENCOUNTER — Other Ambulatory Visit: Payer: Self-pay | Admitting: Family Medicine

## 2013-09-25 ENCOUNTER — Encounter: Payer: Self-pay | Admitting: *Deleted

## 2013-09-25 ENCOUNTER — Other Ambulatory Visit: Payer: Self-pay | Admitting: *Deleted

## 2013-09-25 ENCOUNTER — Telehealth: Payer: Self-pay | Admitting: *Deleted

## 2013-09-25 DIAGNOSIS — C50919 Malignant neoplasm of unspecified site of unspecified female breast: Secondary | ICD-10-CM

## 2013-09-25 NOTE — Telephone Encounter (Signed)
Received call from pt stating that she has scheduled a bone density & mammo at [facility] for Sept & needs order sent to them.  Informed that we would take care of this.

## 2013-10-08 DIAGNOSIS — F3342 Major depressive disorder, recurrent, in full remission: Secondary | ICD-10-CM | POA: Diagnosis not present

## 2013-10-21 ENCOUNTER — Other Ambulatory Visit: Payer: Self-pay | Admitting: Family Medicine

## 2013-10-27 ENCOUNTER — Other Ambulatory Visit: Payer: Self-pay | Admitting: Family Medicine

## 2013-11-02 ENCOUNTER — Other Ambulatory Visit: Payer: Self-pay | Admitting: Family Medicine

## 2013-11-04 DIAGNOSIS — M1710 Unilateral primary osteoarthritis, unspecified knee: Secondary | ICD-10-CM | POA: Diagnosis not present

## 2013-11-04 MED ORDER — LOSARTAN POTASSIUM 100 MG PO TABS: 100.0000 mg | ORAL_TABLET | Freq: Every day | ORAL | Status: DC

## 2013-11-06 ENCOUNTER — Other Ambulatory Visit: Payer: Self-pay | Admitting: Family Medicine

## 2013-12-09 DIAGNOSIS — H401490 Capsular glaucoma with pseudoexfoliation of lens, unspecified eye, stage unspecified: Secondary | ICD-10-CM | POA: Diagnosis not present

## 2013-12-09 DIAGNOSIS — H27119 Subluxation of lens, unspecified eye: Secondary | ICD-10-CM | POA: Diagnosis not present

## 2013-12-12 ENCOUNTER — Encounter (INDEPENDENT_AMBULATORY_CARE_PROVIDER_SITE_OTHER): Payer: Self-pay | Admitting: Ophthalmology

## 2013-12-12 ENCOUNTER — Encounter (INDEPENDENT_AMBULATORY_CARE_PROVIDER_SITE_OTHER): Payer: Medicare Other | Admitting: Ophthalmology

## 2013-12-12 ENCOUNTER — Ambulatory Visit (INDEPENDENT_AMBULATORY_CARE_PROVIDER_SITE_OTHER): Payer: Self-pay | Admitting: Ophthalmology

## 2013-12-12 DIAGNOSIS — I10 Essential (primary) hypertension: Secondary | ICD-10-CM

## 2013-12-12 DIAGNOSIS — H3530 Unspecified macular degeneration: Secondary | ICD-10-CM | POA: Diagnosis not present

## 2013-12-12 DIAGNOSIS — T8522XA Displacement of intraocular lens, initial encounter: Secondary | ICD-10-CM | POA: Insufficient documentation

## 2013-12-12 DIAGNOSIS — H43819 Vitreous degeneration, unspecified eye: Secondary | ICD-10-CM

## 2013-12-12 DIAGNOSIS — H2700 Aphakia, unspecified eye: Secondary | ICD-10-CM

## 2013-12-12 DIAGNOSIS — H27139 Posterior dislocation of lens, unspecified eye: Secondary | ICD-10-CM | POA: Diagnosis not present

## 2013-12-12 DIAGNOSIS — H35039 Hypertensive retinopathy, unspecified eye: Secondary | ICD-10-CM

## 2013-12-12 NOTE — H&P (Signed)
Stephanie Dickerson is an 80 y.o. female.   Chief Complaint: sudden loss of vision one week ago right eye HPI: dislocated intraocular lens right eye  Past Medical History  Diagnosis Date  . Arthritis   . Chicken pox   . Hyperlipidemia   . Osteopenia   . Hypertension   . Cancer     Breast cancer-Right  . Depression     Past Surgical History  Procedure Laterality Date  . Tonsillectomy and adenoidectomy    . Rotator cuff surgery  2011  . Arthroscopy knee surgery      left  . Cataract surgery    . Breast surgery      Lumpectomy  . Spine surgery      Family History  Problem Relation Age of Onset  . Lung cancer      husband  . Bone cancer Mother   . Leukemia Father    Social History:  reports that she has quit smoking. She does not have any smokeless tobacco history on file. She reports that she drinks alcohol. Her drug history is not on file.  Allergies: No Known Allergies   (Not in a hospital admission)  Review of systems otherwise negative  There were no vitals taken for this visit.  Physical exam: Mental status: oriented x3. Eyes: See eye exam associated with this date of surgery in media tab.  Scanned in by scanning center Ears, Nose, Throat: within normal limits Neck: Within Normal limits General: within normal limits Chest: Within normal limits Breast: deferred Heart: Within normal limits Abdomen: Within normal limits GU: deferred Extremities: within normal limits Skin: within normal limits  Assessment/Plan Dislocated intraocular lens right eye Plan: To [facility] for pars plana vitrectomy, removal of lens from vitreous, placement of secondary intraocular lens with suture. Laser. Gas injection right eye  Hayden Pedro 12/12/2013, 12:03 PM

## 2013-12-13 ENCOUNTER — Encounter (HOSPITAL_COMMUNITY): Payer: Self-pay | Admitting: Pharmacy Technician

## 2013-12-16 ENCOUNTER — Encounter (HOSPITAL_COMMUNITY): Payer: Self-pay | Admitting: *Deleted

## 2013-12-16 MED ORDER — GATIFLOXACIN 0.5 % OP SOLN
1.0000 [drp] | OPHTHALMIC | Status: AC | PRN
  Administered 2013-12-17 (×3): 1 [drp] via OPHTHALMIC
  Filled 2013-12-16: qty 2.5

## 2013-12-16 MED ORDER — CYCLOPENTOLATE HCL 1 % OP SOLN
1.0000 [drp] | OPHTHALMIC | Status: AC | PRN
  Administered 2013-12-17 (×3): 1 [drp] via OPHTHALMIC
  Filled 2013-12-16: qty 2

## 2013-12-16 MED ORDER — TROPICAMIDE 1 % OP SOLN
1.0000 [drp] | OPHTHALMIC | Status: AC | PRN
  Administered 2013-12-17 (×3): 1 [drp] via OPHTHALMIC
  Filled 2013-12-16: qty 3

## 2013-12-16 MED ORDER — CEFAZOLIN SODIUM-DEXTROSE 2-3 GM-% IV SOLR
2.0000 g | INTRAVENOUS | Status: AC
  Administered 2013-12-17: 2 g via INTRAVENOUS
  Filled 2013-12-16: qty 50

## 2013-12-16 MED ORDER — PHENYLEPHRINE HCL 2.5 % OP SOLN
1.0000 [drp] | OPHTHALMIC | Status: AC | PRN
  Administered 2013-12-17 (×3): 1 [drp] via OPHTHALMIC
  Filled 2013-12-16: qty 2

## 2013-12-17 ENCOUNTER — Encounter (HOSPITAL_COMMUNITY): Payer: Self-pay | Admitting: Surgery

## 2013-12-17 ENCOUNTER — Other Ambulatory Visit (HOSPITAL_COMMUNITY): Payer: Self-pay | Admitting: Ophthalmology

## 2013-12-17 ENCOUNTER — Ambulatory Visit (HOSPITAL_COMMUNITY): Payer: Medicare Other | Admitting: Certified Registered Nurse Anesthetist

## 2013-12-17 ENCOUNTER — Ambulatory Visit (HOSPITAL_COMMUNITY)
Admission: RE | Admit: 2013-12-17 | Discharge: 2013-12-18 | Disposition: A | Discharge status: Home / Self Care | Payer: Medicare Other | Source: Ambulatory Visit | Attending: Ophthalmology | Admitting: Ophthalmology

## 2013-12-17 ENCOUNTER — Encounter (HOSPITAL_COMMUNITY): Payer: Medicare Other | Admitting: Certified Registered Nurse Anesthetist

## 2013-12-17 ENCOUNTER — Ambulatory Visit (INDEPENDENT_AMBULATORY_CARE_PROVIDER_SITE_OTHER): Payer: Self-pay | Admitting: Ophthalmology

## 2013-12-17 ENCOUNTER — Encounter (HOSPITAL_COMMUNITY)
Admission: RE | Disposition: A | Discharge status: Home / Self Care | Payer: Self-pay | Source: Ambulatory Visit | Attending: Ophthalmology

## 2013-12-17 DIAGNOSIS — H27132 Posterior dislocation of lens, left eye: Secondary | ICD-10-CM

## 2013-12-17 DIAGNOSIS — F329 Major depressive disorder, single episode, unspecified: Secondary | ICD-10-CM | POA: Insufficient documentation

## 2013-12-17 DIAGNOSIS — H27139 Posterior dislocation of lens, unspecified eye: Secondary | ICD-10-CM | POA: Diagnosis not present

## 2013-12-17 DIAGNOSIS — F3289 Other specified depressive episodes: Secondary | ICD-10-CM | POA: Diagnosis not present

## 2013-12-17 DIAGNOSIS — H43399 Other vitreous opacities, unspecified eye: Secondary | ICD-10-CM | POA: Diagnosis not present

## 2013-12-17 DIAGNOSIS — Z87891 Personal history of nicotine dependence: Secondary | ICD-10-CM | POA: Diagnosis not present

## 2013-12-17 DIAGNOSIS — Y849 Medical procedure, unspecified as the cause of abnormal reaction of the patient, or of later complication, without mention of misadventure at the time of the procedure: Secondary | ICD-10-CM | POA: Insufficient documentation

## 2013-12-17 DIAGNOSIS — I10 Essential (primary) hypertension: Secondary | ICD-10-CM | POA: Diagnosis not present

## 2013-12-17 DIAGNOSIS — H27131 Posterior dislocation of lens, right eye: Secondary | ICD-10-CM

## 2013-12-17 DIAGNOSIS — M1990 Unspecified osteoarthritis, unspecified site: Secondary | ICD-10-CM | POA: Diagnosis not present

## 2013-12-17 DIAGNOSIS — T8522XA Displacement of intraocular lens, initial encounter: Secondary | ICD-10-CM | POA: Diagnosis present

## 2013-12-17 DIAGNOSIS — T8529XA Other mechanical complication of intraocular lens, initial encounter: Secondary | ICD-10-CM | POA: Insufficient documentation

## 2013-12-17 DIAGNOSIS — F411 Generalized anxiety disorder: Secondary | ICD-10-CM | POA: Insufficient documentation

## 2013-12-17 HISTORY — PX: VITRECTOMY: SHX106

## 2013-12-17 HISTORY — DX: Unspecified glaucoma: H40.9

## 2013-12-17 HISTORY — DX: Anxiety disorder, unspecified: F41.9

## 2013-12-17 HISTORY — PX: PARS PLANA VITRECTOMY: SHX2166

## 2013-12-17 HISTORY — PX: LASER PHOTO ABLATION: SHX5942

## 2013-12-17 LAB — CBC
HEMATOCRIT: 36.4 % (ref 36.0–46.0)
Hemoglobin: 11.8 g/dL — ABNORMAL LOW (ref 12.0–15.0)
MCH: 29.9 pg (ref 26.0–34.0)
MCHC: 32.4 g/dL (ref 30.0–36.0)
MCV: 92.4 fL (ref 78.0–100.0)
Platelets: 192 10*3/uL (ref 150–400)
RBC: 3.94 MIL/uL (ref 3.87–5.11)
RDW: 14.4 % (ref 11.5–15.5)
WBC: 8.7 10*3/uL (ref 4.0–10.5)

## 2013-12-17 LAB — BASIC METABOLIC PANEL
Anion gap: 12 (ref 5–15)
BUN: 29 mg/dL — ABNORMAL HIGH (ref 6–23)
CO2: 24 meq/L (ref 19–32)
Calcium: 9.7 mg/dL (ref 8.4–10.5)
Chloride: 105 mEq/L (ref 96–112)
Creatinine, Ser: 1.03 mg/dL (ref 0.50–1.10)
GFR calc Af Amer: 55 mL/min — ABNORMAL LOW (ref >90)
GFR calc non Af Amer: 47 mL/min — ABNORMAL LOW (ref >90)
GLUCOSE: 112 mg/dL — AB (ref 70–99)
POTASSIUM: 5 meq/L (ref 3.7–5.3)
SODIUM: 141 meq/L (ref 137–147)

## 2013-12-17 SURGERY — PARS PLANA VITRECTOMY WITH 25G REMOVAL/SUTURE INTRAOCULAR LENS
Anesthesia: General | Site: Eye | Laterality: Right

## 2013-12-17 MED ORDER — FENTANYL CITRATE 0.05 MG/ML IJ SOLN
INTRAMUSCULAR | Status: DC | PRN
  Administered 2013-12-17 (×4): 50 ug via INTRAVENOUS
  Administered 2013-12-17: 25 ug via INTRAVENOUS

## 2013-12-17 MED ORDER — DEXAMETHASONE SODIUM PHOSPHATE 10 MG/ML IJ SOLN
INTRAMUSCULAR | Status: AC
  Filled 2013-12-17: qty 1

## 2013-12-17 MED ORDER — PROPOFOL 10 MG/ML IV BOLUS
INTRAVENOUS | Status: AC
  Filled 2013-12-17: qty 20

## 2013-12-17 MED ORDER — AMLODIPINE BESYLATE 10 MG PO TABS
10.0000 mg | ORAL_TABLET | Freq: Every day | ORAL | Status: DC
  Administered 2013-12-17: 10 mg via ORAL
  Filled 2013-12-17 (×2): qty 1

## 2013-12-17 MED ORDER — BSS IO SOLN
INTRAOCULAR | Status: DC | PRN
  Administered 2013-12-17: 15 mL via INTRAOCULAR

## 2013-12-17 MED ORDER — HYDROCODONE-ACETAMINOPHEN 5-325 MG PO TABS
1.0000 | ORAL_TABLET | ORAL | Status: DC | PRN
  Administered 2013-12-17: 1 via ORAL
  Filled 2013-12-17: qty 1

## 2013-12-17 MED ORDER — DOCUSATE SODIUM 100 MG PO CAPS
100.0000 mg | ORAL_CAPSULE | Freq: Two times a day (BID) | ORAL | Status: DC
  Administered 2013-12-17: 100 mg via ORAL
  Filled 2013-12-17: qty 1

## 2013-12-17 MED ORDER — ONDANSETRON HCL 4 MG/2ML IJ SOLN
INTRAMUSCULAR | Status: DC | PRN
  Administered 2013-12-17: 4 mg via INTRAVENOUS

## 2013-12-17 MED ORDER — ACETAMINOPHEN 325 MG PO TABS
325.0000 mg | ORAL_TABLET | ORAL | Status: DC | PRN
  Administered 2013-12-17: 650 mg via ORAL
  Filled 2013-12-17: qty 2

## 2013-12-17 MED ORDER — LATANOPROST 0.005 % OP SOLN
1.0000 [drp] | Freq: Every day | OPHTHALMIC | Status: DC
  Filled 2013-12-17: qty 2.5

## 2013-12-17 MED ORDER — LIDOCAINE HCL (CARDIAC) 20 MG/ML IV SOLN
INTRAVENOUS | Status: AC
  Filled 2013-12-17: qty 10

## 2013-12-17 MED ORDER — 0.9 % SODIUM CHLORIDE (POUR BTL) OPTIME
TOPICAL | Status: DC | PRN
  Administered 2013-12-17: 1000 mL

## 2013-12-17 MED ORDER — TRIAMCINOLONE ACETONIDE 40 MG/ML IJ SUSP
INTRAMUSCULAR | Status: AC
  Filled 2013-12-17: qty 5

## 2013-12-17 MED ORDER — SODIUM CHLORIDE 0.9 % IJ SOLN
INTRAMUSCULAR | Status: DC | PRN
  Administered 2013-12-17: 14:00:00

## 2013-12-17 MED ORDER — ROCURONIUM BROMIDE 50 MG/5ML IV SOLN
INTRAVENOUS | Status: AC
  Filled 2013-12-17: qty 1

## 2013-12-17 MED ORDER — ONDANSETRON HCL 4 MG/2ML IJ SOLN: 4.0000 mg | Freq: Four times a day (QID) | INTRAMUSCULAR | Status: DC | PRN

## 2013-12-17 MED ORDER — TETRACAINE HCL 0.5 % OP SOLN
2.0000 [drp] | Freq: Once | OPHTHALMIC | Status: DC
  Filled 2013-12-17: qty 2

## 2013-12-17 MED ORDER — EPINEPHRINE HCL 1 MG/ML IJ SOLN
INTRAMUSCULAR | Status: AC
  Filled 2013-12-17: qty 1

## 2013-12-17 MED ORDER — ANASTROZOLE 1 MG PO TABS
1.0000 mg | ORAL_TABLET | Freq: Every day | ORAL | Status: DC
  Administered 2013-12-17: 1 mg via ORAL
  Filled 2013-12-17 (×2): qty 1

## 2013-12-17 MED ORDER — LIDOCAINE HCL 4 % MT SOLN
OROMUCOSAL | Status: DC | PRN
  Administered 2013-12-17: 4 mL via TOPICAL

## 2013-12-17 MED ORDER — BSS IO SOLN
INTRAOCULAR | Status: AC
  Filled 2013-12-17: qty 15

## 2013-12-17 MED ORDER — EPINEPHRINE HCL 1 MG/ML IJ SOLN
INTRAOCULAR | Status: DC | PRN
  Administered 2013-12-17: 14:00:00

## 2013-12-17 MED ORDER — BACITRACIN-POLYMYXIN B 500-10000 UNIT/GM OP OINT
TOPICAL_OINTMENT | OPHTHALMIC | Status: AC
  Filled 2013-12-17: qty 3.5

## 2013-12-17 MED ORDER — PHENYLEPHRINE HCL 10 MG/ML IJ SOLN
10.0000 mg | INTRAVENOUS | Status: DC | PRN
  Administered 2013-12-17: 20 ug/min via INTRAVENOUS

## 2013-12-17 MED ORDER — ONDANSETRON HCL 4 MG/2ML IJ SOLN
INTRAMUSCULAR | Status: AC
  Filled 2013-12-17: qty 2

## 2013-12-17 MED ORDER — TEMAZEPAM 15 MG PO CAPS: 15.0000 mg | ORAL_CAPSULE | Freq: Every evening | ORAL | Status: DC | PRN

## 2013-12-17 MED ORDER — BUPIVACAINE HCL (PF) 0.75 % IJ SOLN
INTRAMUSCULAR | Status: DC | PRN
  Administered 2013-12-17: 10 mL

## 2013-12-17 MED ORDER — GATIFLOXACIN 0.5 % OP SOLN
1.0000 [drp] | Freq: Four times a day (QID) | OPHTHALMIC | Status: DC
  Filled 2013-12-17: qty 2.5

## 2013-12-17 MED ORDER — LOSARTAN POTASSIUM 50 MG PO TABS
100.0000 mg | ORAL_TABLET | Freq: Every day | ORAL | Status: DC
  Filled 2013-12-17 (×2): qty 2

## 2013-12-17 MED ORDER — SODIUM HYALURONATE 10 MG/ML IO SOLN
INTRAOCULAR | Status: AC
  Filled 2013-12-17: qty 0.85

## 2013-12-17 MED ORDER — MORPHINE SULFATE 2 MG/ML IJ SOLN: 1.0000 mg | INTRAMUSCULAR | Status: DC | PRN

## 2013-12-17 MED ORDER — FENTANYL CITRATE 0.05 MG/ML IJ SOLN
INTRAMUSCULAR | Status: AC
  Filled 2013-12-17: qty 5

## 2013-12-17 MED ORDER — LORAZEPAM 1 MG PO TABS
1.0000 mg | ORAL_TABLET | Freq: Every day | ORAL | Status: DC
  Administered 2013-12-17: 1 mg via ORAL
  Filled 2013-12-17: qty 1

## 2013-12-17 MED ORDER — SODIUM CHLORIDE 0.9 % IJ SOLN
INTRAMUSCULAR | Status: AC
  Filled 2013-12-17: qty 10

## 2013-12-17 MED ORDER — PROPOFOL 10 MG/ML IV BOLUS
INTRAVENOUS | Status: DC | PRN
  Administered 2013-12-17: 130 mg via INTRAVENOUS

## 2013-12-17 MED ORDER — MAGNESIUM HYDROXIDE 400 MG/5ML PO SUSP: 15.0000 mL | Freq: Four times a day (QID) | ORAL | Status: DC | PRN

## 2013-12-17 MED ORDER — CITALOPRAM HYDROBROMIDE 20 MG PO TABS
20.0000 mg | ORAL_TABLET | Freq: Every day | ORAL | Status: DC
  Administered 2013-12-17: 20 mg via ORAL
  Filled 2013-12-17 (×2): qty 1

## 2013-12-17 MED ORDER — GLYCOPYRROLATE 0.2 MG/ML IJ SOLN
INTRAMUSCULAR | Status: DC | PRN
  Administered 2013-12-17: .6 mg via INTRAVENOUS
  Administered 2013-12-17: 0.2 mg via INTRAVENOUS

## 2013-12-17 MED ORDER — POLYMYXIN B SULFATE 500000 UNITS IJ SOLR
INTRAMUSCULAR | Status: AC
  Filled 2013-12-17: qty 1

## 2013-12-17 MED ORDER — SODIUM CHLORIDE 0.45 % IV SOLN
INTRAVENOUS | Status: DC
  Administered 2013-12-17: 19:00:00 via INTRAVENOUS

## 2013-12-17 MED ORDER — ROCURONIUM BROMIDE 100 MG/10ML IV SOLN
INTRAVENOUS | Status: DC | PRN
  Administered 2013-12-17: 50 mg via INTRAVENOUS

## 2013-12-17 MED ORDER — DEXAMETHASONE SODIUM PHOSPHATE 10 MG/ML IJ SOLN
INTRAMUSCULAR | Status: DC | PRN
  Administered 2013-12-17: 10 mg

## 2013-12-17 MED ORDER — SODIUM CHLORIDE 0.9 % IV SOLN
INTRAVENOUS | Status: DC | PRN
  Administered 2013-12-17 (×2): via INTRAVENOUS

## 2013-12-17 MED ORDER — ACETAZOLAMIDE SODIUM 500 MG IJ SOLR
500.0000 mg | Freq: Once | INTRAMUSCULAR | Status: AC
  Administered 2013-12-18: 500 mg via INTRAVENOUS
  Filled 2013-12-17: qty 500

## 2013-12-17 MED ORDER — IMIPRAMINE HCL 50 MG PO TABS
100.0000 mg | ORAL_TABLET | Freq: Every day | ORAL | Status: DC
  Administered 2013-12-17: 100 mg via ORAL
  Filled 2013-12-17 (×2): qty 2

## 2013-12-17 MED ORDER — METHOCARBAMOL 500 MG PO TABS: 500.0000 mg | ORAL_TABLET | Freq: Every day | ORAL | Status: DC | PRN

## 2013-12-17 MED ORDER — BRIMONIDINE TARTRATE 0.2 % OP SOLN
1.0000 [drp] | Freq: Two times a day (BID) | OPHTHALMIC | Status: DC
  Filled 2013-12-17: qty 5

## 2013-12-17 MED ORDER — ATORVASTATIN CALCIUM 40 MG PO TABS
40.0000 mg | ORAL_TABLET | Freq: Every day | ORAL | Status: DC
  Filled 2013-12-17: qty 1

## 2013-12-17 MED ORDER — BACITRACIN-POLYMYXIN B 500-10000 UNIT/GM OP OINT
1.0000 "application " | TOPICAL_OINTMENT | Freq: Four times a day (QID) | OPHTHALMIC | Status: DC
  Filled 2013-12-17: qty 3.5

## 2013-12-17 MED ORDER — NEOSTIGMINE METHYLSULFATE 10 MG/10ML IV SOLN
INTRAVENOUS | Status: DC | PRN
  Administered 2013-12-17: 4 mg via INTRAVENOUS

## 2013-12-17 MED ORDER — LIDOCAINE HCL (CARDIAC) 20 MG/ML IV SOLN
INTRAVENOUS | Status: DC | PRN
  Administered 2013-12-17: 20 mg via INTRAVENOUS

## 2013-12-17 MED ORDER — GLYCOPYRROLATE 0.2 MG/ML IJ SOLN
INTRAMUSCULAR | Status: AC
  Filled 2013-12-17: qty 1

## 2013-12-17 MED ORDER — SODIUM HYALURONATE 10 MG/ML IO SOLN
INTRAOCULAR | Status: DC | PRN
  Administered 2013-12-17: 0.85 mL via INTRAOCULAR

## 2013-12-17 MED ORDER — TRAZODONE HCL 100 MG PO TABS
100.0000 mg | ORAL_TABLET | Freq: Every day | ORAL | Status: DC
  Administered 2013-12-17: 100 mg via ORAL
  Filled 2013-12-17 (×2): qty 1

## 2013-12-17 MED ORDER — ATROPINE SULFATE 1 % OP SOLN
OPHTHALMIC | Status: AC
  Filled 2013-12-17: qty 2

## 2013-12-17 MED ORDER — BUPIVACAINE HCL (PF) 0.75 % IJ SOLN
INTRAMUSCULAR | Status: AC
  Filled 2013-12-17: qty 10

## 2013-12-17 MED ORDER — EPHEDRINE SULFATE 50 MG/ML IJ SOLN
INTRAMUSCULAR | Status: DC | PRN
  Administered 2013-12-17: 15 mg via INTRAVENOUS
  Administered 2013-12-17 (×3): 10 mg via INTRAVENOUS
  Administered 2013-12-17: 5 mg via INTRAVENOUS

## 2013-12-17 MED ORDER — CELECOXIB 200 MG PO CAPS
200.0000 mg | ORAL_CAPSULE | Freq: Every day | ORAL | Status: DC
Start: 2013-12-18 — End: 2013-12-18
  Administered 2013-12-18: 200 mg via ORAL
  Filled 2013-12-17 (×2): qty 1

## 2013-12-17 MED ORDER — GENTAMICIN SULFATE 40 MG/ML IJ SOLN
INTRAMUSCULAR | Status: AC
  Filled 2013-12-17: qty 2

## 2013-12-17 MED ORDER — GLYCOPYRROLATE 0.2 MG/ML IJ SOLN
INTRAMUSCULAR | Status: AC
  Filled 2013-12-17: qty 3

## 2013-12-17 MED ORDER — BSS PLUS IO SOLN
INTRAOCULAR | Status: AC
  Filled 2013-12-17: qty 500

## 2013-12-17 MED ORDER — BACITRACIN-POLYMYXIN B 500-10000 UNIT/GM OP OINT
TOPICAL_OINTMENT | OPHTHALMIC | Status: DC | PRN
  Administered 2013-12-17: 1 via OPHTHALMIC

## 2013-12-17 MED ORDER — FENTANYL CITRATE 0.05 MG/ML IJ SOLN: 25.0000 ug | INTRAMUSCULAR | Status: DC | PRN

## 2013-12-17 MED ORDER — PREDNISOLONE ACETATE 1 % OP SUSP
1.0000 [drp] | Freq: Four times a day (QID) | OPHTHALMIC | Status: DC
  Filled 2013-12-17: qty 1

## 2013-12-17 SURGICAL SUPPLY — 65 items
APL SRG 3 HI ABS STRL LF PLS (MISCELLANEOUS)
APPLICATOR DR MATTHEWS STRL (MISCELLANEOUS) IMPLANT
BALL CTTN LRG ABS STRL LF (GAUZE/BANDAGES/DRESSINGS) ×3
BLADE EYE CATARACT 19 1.4 BEAV (BLADE) IMPLANT
BLADE KERATOME 2.75 (BLADE) ×2 IMPLANT
BLADE KERATOME 2.75MM (BLADE) ×1
CANNULA VLV SOFT TIP 25G (OPHTHALMIC) IMPLANT
CANNULA VLV SOFT TIP 25GA (OPHTHALMIC) IMPLANT
CORDS BIPOLAR (ELECTRODE) ×3 IMPLANT
COTTONBALL LRG STERILE PKG (GAUZE/BANDAGES/DRESSINGS) ×9 IMPLANT
COVER MAYO STAND STRL (DRAPES) ×3 IMPLANT
DRAPE INCISE 51X51 W/FILM STRL (DRAPES) ×2 IMPLANT
DRAPE OPHTHALMIC 77X100 STRL (CUSTOM PROCEDURE TRAY) ×3 IMPLANT
FORCEPS ECKARDT ILM 25G SERR (OPHTHALMIC RELATED) ×2 IMPLANT
FORCEPS GRIESHABER ILM 25G A (INSTRUMENTS) ×2 IMPLANT
FORCEPS HORIZONTAL 25G DISP (OPHTHALMIC RELATED) IMPLANT
GAS OPHTHALMIC (MISCELLANEOUS) IMPLANT
GLOVE SS BIOGEL STRL SZ 6.5 (GLOVE) ×2 IMPLANT
GLOVE SS BIOGEL STRL SZ 7 (GLOVE) ×1 IMPLANT
GLOVE SUPERSENSE BIOGEL SZ 6.5 (GLOVE) ×4
GLOVE SUPERSENSE BIOGEL SZ 7 (GLOVE) ×4
GLOVE SURG 8.5 LATEX PF (GLOVE) ×5 IMPLANT
GOWN STRL REUS W/ TWL LRG LVL3 (GOWN DISPOSABLE) ×3 IMPLANT
GOWN STRL REUS W/TWL LRG LVL3 (GOWN DISPOSABLE) ×9
HANDLE PNEUMATIC FOR CONSTEL (OPHTHALMIC) ×2 IMPLANT
KIT BASIN OR (CUSTOM PROCEDURE TRAY) ×3 IMPLANT
KIT ROOM TURNOVER OR (KITS) ×3 IMPLANT
LENS IOL POST 1PIECE DIOP 21.5 (Intraocular Lens) ×2 IMPLANT
NDL 18GX1X1/2 (RX/OR ONLY) (NEEDLE) ×1 IMPLANT
NDL 25GX 5/8IN NON SAFETY (NEEDLE) ×1 IMPLANT
NDL 27GX1/2 REG BEVEL ECLIP (NEEDLE) IMPLANT
NDL FILTER BLUNT 18X1 1/2 (NEEDLE) ×1 IMPLANT
NDL HYPO 30X.5 LL (NEEDLE) ×1 IMPLANT
NEEDLE 18GX1X1/2 (RX/OR ONLY) (NEEDLE) ×3 IMPLANT
NEEDLE 25GX 5/8IN NON SAFETY (NEEDLE) ×3 IMPLANT
NEEDLE 27GX1/2 REG BEVEL ECLIP (NEEDLE) ×3 IMPLANT
NEEDLE FILTER BLUNT 18X 1/2SAF (NEEDLE) ×2
NEEDLE FILTER BLUNT 18X1 1/2 (NEEDLE) ×1 IMPLANT
NEEDLE HYPO 30X.5 LL (NEEDLE) ×3 IMPLANT
NS IRRIG 1000ML POUR BTL (IV SOLUTION) ×3 IMPLANT
PACK VITRECTOMY CUSTOM (CUSTOM PROCEDURE TRAY) ×3 IMPLANT
PAD ARMBOARD 7.5X6 YLW CONV (MISCELLANEOUS) ×6 IMPLANT
PAK PIK VITRECTOMY CVS 25GA (OPHTHALMIC) ×3 IMPLANT
PENCIL BIPOLAR 25GA STR DISP (OPHTHALMIC RELATED) IMPLANT
PIC ILLUMINATED 25G (OPHTHALMIC) ×3
PIK ILLUMINATED 25G (OPHTHALMIC) ×1 IMPLANT
PROBE LASER ILLUM FLEX CVD 25G (OPHTHALMIC) ×3 IMPLANT
ROLLS DENTAL (MISCELLANEOUS) ×6 IMPLANT
SCRAPER DIAMOND 25GA (OPHTHALMIC RELATED) ×2 IMPLANT
SPEAR EYE SURG WECK-CEL (MISCELLANEOUS) ×6 IMPLANT
SPONGE SURGIFOAM ABS GEL 12-7 (HEMOSTASIS) ×3 IMPLANT
STOPCOCK 4 WAY LG BORE MALE ST (IV SETS) IMPLANT
SUT CHROMIC 7 0 TG140 8 (SUTURE) ×3 IMPLANT
SUT ETHILON 10 0 CS140 6 (SUTURE) ×5 IMPLANT
SUT ETHILON 9 0 BV100 4 (SUTURE) IMPLANT
SUT POLY NON ABSORB 10-0 8 STR (SUTURE) ×6 IMPLANT
SUT SILK 4 0 RB 1 (SUTURE) ×2 IMPLANT
SYR 20CC LL (SYRINGE) ×3 IMPLANT
SYR BULB 3OZ (MISCELLANEOUS) ×3 IMPLANT
SYR TB 1ML LUER SLIP (SYRINGE) ×3 IMPLANT
SYRINGE 10CC LL (SYRINGE) IMPLANT
TAPE SURG TRANSPORE 1 IN (GAUZE/BANDAGES/DRESSINGS) ×1 IMPLANT
TAPE SURGICAL TRANSPORE 1 IN (GAUZE/BANDAGES/DRESSINGS) ×2
TOWEL OR 17X24 6PK STRL BLUE (TOWEL DISPOSABLE) ×9 IMPLANT
WATER STERILE IRR 1000ML POUR (IV SOLUTION) ×3 IMPLANT

## 2013-12-17 NOTE — Progress Notes (Signed)
Brief Operative note   Preoperative diagnosis:  dislocated intra ocular lens right eye Postoperative diagnosis  Post-Op Diagnosis Codes:    * Posterior dislocation of lens, right [379.34]  Procedures: Pars plana vitrectomy, laser, removal of IOL from the vitreous, secondary IOL placement with suture, gas injection right eye  Surgeon:  Hayden Pedro, MD...  Assistant:  Deatra Ina SA    Anesthesia: General  Specimen: none  Estimated blood loss:  1cc  Complications: none  Patient sent to PACU in good condition  Composed by Hayden Pedro MD  Dictation number: [id number]

## 2013-12-17 NOTE — Transfer of Care (Signed)
Immediate Anesthesia Transfer of Care Note  Patient: Stephanie Dickerson  Procedure(s) Performed: Procedure(s): PARS PLANA VITRECTOMY WITH 25G REMOVAL/SUTURE INTRAOCULAR LENS, ENDOLASER (Right) LASER PHOTO ABLATION (Right)  Patient Location: PACU  Anesthesia Type:General  Level of Consciousness: awake and alert   Airway & Oxygen Therapy: Patient Spontanous Breathing and Patient connected to nasal cannula oxygen  Post-op Assessment: Report given to PACU RN, Post -op Vital signs reviewed and stable and Patient moving all extremities X 4  Post vital signs: Reviewed and stable  Complications: No apparent anesthesia complications

## 2013-12-17 NOTE — Anesthesia Preprocedure Evaluation (Addendum)
Anesthesia Evaluation  Patient identified by MRN, date of birth, ID band Patient awake    Reviewed: Allergy & Precautions, H&P , NPO status , Patient's Chart, lab work & pertinent test results  Airway Mallampati: III TM Distance: >3 FB Neck ROM: Full    Dental no notable dental hx. (+) Partial Upper, Dental Advisory Given   Pulmonary neg pulmonary ROS, former smoker,  breath sounds clear to auscultation  Pulmonary exam normal       Cardiovascular hypertension, Pt. on medications Rhythm:Regular Rate:Normal     Neuro/Psych Anxiety Depression negative neurological ROS     GI/Hepatic negative GI ROS, Neg liver ROS,   Endo/Other  negative endocrine ROS  Renal/GU negative Renal ROS  negative genitourinary   Musculoskeletal   Abdominal   Peds  Hematology negative hematology ROS (+)   Anesthesia Other Findings   Reproductive/Obstetrics negative OB ROS                         Anesthesia Physical Anesthesia Plan  ASA: II  Anesthesia Plan: General   Post-op Pain Management:    Induction: Intravenous  Airway Management Planned: Oral ETT  Additional Equipment:   Intra-op Plan:   Post-operative Plan: Extubation in OR  Informed Consent: I have reviewed the patients History and Physical, chart, labs and discussed the procedure including the risks, benefits and alternatives for the proposed anesthesia with the patient or authorized representative who has indicated his/her understanding and acceptance.   Dental advisory given  Plan Discussed with: CRNA  Anesthesia Plan Comments:         Anesthesia Quick Evaluation

## 2013-12-17 NOTE — Anesthesia Procedure Notes (Signed)
Procedure Name: Intubation Date/Time: 12/17/2013 1:59 PM Performed by: Blair Heys E Pre-anesthesia Checklist: Patient identified, Emergency Drugs available, Suction available and Patient being monitored Patient Re-evaluated:Patient Re-evaluated prior to inductionOxygen Delivery Method: Circle system utilized Preoxygenation: Pre-oxygenation with 100% oxygen Intubation Type: IV induction Ventilation: Mask ventilation without difficulty Laryngoscope Size: Miller and 2 Grade View: Grade I Tube type: Oral Tube size: 7.5 mm Number of attempts: 1 Airway Equipment and Method: Stylet and LTA kit utilized Placement Confirmation: ETT inserted through vocal cords under direct vision,  positive ETCO2 and breath sounds checked- equal and bilateral Secured at: 21 cm Tube secured with: Tape Dental Injury: Teeth and Oropharynx as per pre-operative assessment

## 2013-12-17 NOTE — H&P (Signed)
I examined the patient today and there is no change in the medical status 

## 2013-12-17 NOTE — Op Note (Signed)
NAMEALLISSA, ALBRIGHT           ACCOUNT NO.:  [id number]  MEDICAL RECORD NO.:  [id number]  LOCATION:  6N29C                        FACILITY:  [facility]  PHYSICIAN:  Chrystie Nose. Zigmund Daniel, M.D. DATE OF BIRTH:  27-Mar-1924  DATE OF PROCEDURE:  12/17/2013 DATE OF DISCHARGE:                              OPERATIVE REPORT   ADMISSION DIAGNOSIS:  Dislocated intraocular lens, with capsular remnants in the vitreous cavity.  PROCEDURES:  Pars plana vitrectomy, removal of intraocular lens from vitreous cavity, placement of secondary intraocular lens with suture, retinal photocoagulation, gas-fluid exchange, right eye.  SURGEON:  Chrystie Nose. Zigmund Daniel, M.D.  ASSISTANT:  Deatra Ina, SA.  ANESTHESIA:  General.  DESCRIPTION OF PROCEDURE:  Usual prep and drape.  The indirect ophthalmoscope laser was moved into place.  439 burns placed around the retinal periphery in thin areas of the retina.  The power was 400 mW, 1000 microns each and 0.1 seconds each.  Attention was then carried to the pars plana area.  Conjunctival peritomy from 8 o'clock around to 4 o'clock.  Half-thickness scleral flaps were raised at 3 o'clock and 9 o'clock in anticipation of IOL suture.  The 3-layered corneoscleral wound was started back on the white sclera and extended into the cornea. 25-gauge trocars were placed at 8, 10, and 2 o'clock with infusion at 8 o'clock.  The 3-layered corneal wound was entered into the cornea with keratome.  Pars plana vitrectomy was begun in the anterior chamber with a vitreous cutter.  Some vitreous extended to the iris and to the wound. This vitreous was carefully removed with low suction and rapid cutting, but automated cutter.  The wound became competent at that point, and the posterior vitrectomy was performed, removing membranes from their attachments to the intraocular lens and membranes from the retinal periphery.  The intraocular lens was grasped with 25-gauge automated forceps, brought  into the pupillary axis, transferred to manual 25-gauge forceps, then 0.12 forceps were used to grasp the implant and dialed out of the eye along with the capsular remnants.  The implant and capsular remnants were sent for pathologic study.  Two 10-0 Prolene sutures were then passed from 3 o'clock to 9 o'clock behind the iris in the ciliary sulcus.  A 27-gauge docking maneuver was used to externalize the sutures at the 9 o'clock position.  The sutures were drawn through the pupil and out through the corneoscleral wound.  A new intraocular lens was brought onto the field, made by [employer], model CZ70BD, power 21.5D, length 12.5 mm, optic 7.0 mm, serial #[id number], expiration date March 31, 2018.  The lens was brought onto the field, inspected and cleaned.  Prolene sutures were attached to the eyelids of the lens. The lens was passed into the anterior chamber, then through the small pupil into the posterior chamber in the ciliary sulcus.  The lens was dialed into place.  Prolene sutures were knotted externally and the free ends removed.  The implant was secured.  The corneal wound was closed with five 10-0 nylon sutures.  The wound was tested and found to be secured.  Additional vitrectomy was carried out at this point.  Some blood was seen on the macular surface and  this was vacuumed with the vitreous cutter and the silicone tip suction line.  Additional endolaser was performed at this point, as several areas of thin retina were identified, 362 burns were placed with the endolaser, power 400 mW, 1000 microns each and 0.1 seconds each.  A 30% gas-fluid exchange was carried out.  The instruments were removed from the eye and the trocars were removed from the eye.  The wounds were tested and found to be secured. The conjunctiva was closed with 7-0 chromic suture.  Polymyxin and gentamicin were irrigated into tenon space.  Marcaine was injected around the globe for postop  pain.  Polysporin ophthalmic ointment of patch and shield were placed.  Closing pressure was 10 with a Barraquer tonometer.  The patient was awakened and taken to recovery in satisfactory condition.     Chrystie Nose. Zigmund Daniel, M.D.     JDM/MEDQ  D:  12/17/2013  T:  12/17/2013  Job:  [id number]

## 2013-12-18 ENCOUNTER — Encounter (HOSPITAL_COMMUNITY): Payer: Self-pay | Admitting: Ophthalmology

## 2013-12-18 DIAGNOSIS — T8529XA Other mechanical complication of intraocular lens, initial encounter: Secondary | ICD-10-CM | POA: Diagnosis not present

## 2013-12-18 MED ORDER — PREDNISOLONE ACETATE 1 % OP SUSP: 1.0000 [drp] | Freq: Four times a day (QID) | OPHTHALMIC | Status: DC

## 2013-12-18 MED ORDER — LATANOPROST 0.005 % OP SOLN
1.0000 [drp] | Freq: Every day | OPHTHALMIC | Status: DC
Start: 2013-12-18 — End: 2014-12-05

## 2013-12-18 MED ORDER — BACITRACIN-POLYMYXIN B 500-10000 UNIT/GM OP OINT: 1.0000 "application " | TOPICAL_OINTMENT | Freq: Four times a day (QID) | OPHTHALMIC | Status: DC

## 2013-12-18 MED ORDER — GATIFLOXACIN 0.5 % OP SOLN: 1.0000 [drp] | Freq: Four times a day (QID) | OPHTHALMIC | Status: DC

## 2013-12-18 NOTE — Progress Notes (Signed)
Discharge to home.  Discharge instruction given to patient and son.  No question verbalized.

## 2013-12-18 NOTE — Progress Notes (Signed)
12/18/2013, 6:40 AM  Mental Status:  Awake, Alert, Oriented  Anterior segment: Cornea  Clear  Wound intact.    Anterior Chamber Clear    Lens:    IOL  Intra Ocular Pressure 14 mmHg with Tonopen  Vitreous: Clear 30%gas bubble Hemorrhage  Silicone oil  Retina:  Attached Good laser reaction   Impression: Excellent result Retina attached   Final Diagnosis: Active Problems:   Dislocated IOL (intraocular lens), posterior   Plan: start post operative eye drops.  Include Latanoprost in place of Travatan for one bottle.  Discharge to home.  Give post operative instructions  Stephanie Dickerson 12/18/2013, 6:40 AM

## 2013-12-19 NOTE — Anesthesia Postprocedure Evaluation (Signed)
  Anesthesia Post-op Note  Patient: Stephanie Dickerson  Procedure(s) Performed: Procedure(s): PARS PLANA VITRECTOMY WITH 25G REMOVAL/SUTURE INTRAOCULAR LENS, ENDOLASER (Right) LASER PHOTO ABLATION (Right)  Patient Location: PACU  Anesthesia Type:General  Level of Consciousness: awake and alert   Airway and Oxygen Therapy: Patient Spontanous Breathing  Post-op Pain: mild  Post-op Assessment: Post-op Vital signs reviewed  Post-op Vital Signs: stable  Last Vitals:  Filed Vitals:   12/18/13 0508  BP: 135/63  Pulse: 86  Temp: 36.6 C  Resp: 15    Complications: No apparent anesthesia complications

## 2013-12-25 ENCOUNTER — Inpatient Hospital Stay (INDEPENDENT_AMBULATORY_CARE_PROVIDER_SITE_OTHER): Payer: Medicare Other | Admitting: Ophthalmology

## 2013-12-25 DIAGNOSIS — H2700 Aphakia, unspecified eye: Secondary | ICD-10-CM

## 2014-01-16 ENCOUNTER — Encounter (INDEPENDENT_AMBULATORY_CARE_PROVIDER_SITE_OTHER): Payer: Medicare Other | Admitting: Ophthalmology

## 2014-01-16 DIAGNOSIS — H2700 Aphakia, unspecified eye: Secondary | ICD-10-CM

## 2014-01-21 ENCOUNTER — Other Ambulatory Visit: Payer: Self-pay | Admitting: *Deleted

## 2014-01-21 DIAGNOSIS — C50919 Malignant neoplasm of unspecified site of unspecified female breast: Secondary | ICD-10-CM

## 2014-01-21 MED ORDER — ANASTROZOLE 1 MG PO TABS: 1.0000 mg | ORAL_TABLET | Freq: Every day | ORAL | Status: DC

## 2014-02-06 ENCOUNTER — Encounter: Payer: Self-pay | Admitting: Family Medicine

## 2014-02-06 ENCOUNTER — Ambulatory Visit (INDEPENDENT_AMBULATORY_CARE_PROVIDER_SITE_OTHER): Payer: Medicare Other | Admitting: Family Medicine

## 2014-02-06 VITALS — BP 122/78 | HR 67 | Temp 98.4°F | Ht 63.0 in | Wt 187.0 lb

## 2014-02-06 DIAGNOSIS — F419 Anxiety disorder, unspecified: Secondary | ICD-10-CM

## 2014-02-06 DIAGNOSIS — M179 Osteoarthritis of knee, unspecified: Secondary | ICD-10-CM

## 2014-02-06 DIAGNOSIS — E785 Hyperlipidemia, unspecified: Secondary | ICD-10-CM | POA: Diagnosis not present

## 2014-02-06 DIAGNOSIS — Z23 Encounter for immunization: Secondary | ICD-10-CM | POA: Diagnosis not present

## 2014-02-06 DIAGNOSIS — R739 Hyperglycemia, unspecified: Secondary | ICD-10-CM

## 2014-02-06 DIAGNOSIS — I10 Essential (primary) hypertension: Secondary | ICD-10-CM

## 2014-02-06 DIAGNOSIS — M1712 Unilateral primary osteoarthritis, left knee: Secondary | ICD-10-CM

## 2014-02-06 MED ORDER — ATORVASTATIN CALCIUM 80 MG PO TABS: 40.0000 mg | ORAL_TABLET | Freq: Every day | ORAL | Status: DC

## 2014-02-06 MED ORDER — LOSARTAN POTASSIUM 100 MG PO TABS: 100.0000 mg | ORAL_TABLET | Freq: Every day | ORAL | Status: DC

## 2014-02-06 MED ORDER — AMLODIPINE BESYLATE 10 MG PO TABS: 10.0000 mg | ORAL_TABLET | Freq: Every day | ORAL | Status: DC

## 2014-02-06 NOTE — Patient Instructions (Signed)
-  Schedule your MEDICARE ANNUAL WELLNESS EXAM as you leave to day in about 2-3 months - come fasting to that appointment but please drink plenty water  We recommend the following healthy lifestyle measures: - eat a healthy diet consisting of lots of vegetables, fruits, beans, nuts, seeds, healthy meats such as white chicken and fish and whole grains.  - avoid fried foods, fast food, processed foods, sodas, red meet and other fattening foods.  - get a least 150 minutes of aerobic exercise per week.

## 2014-02-06 NOTE — Addendum Note (Signed)
Addended by: Agnes Lawrence on: 02/06/2014 01:59 PM   Modules accepted: Orders

## 2014-02-06 NOTE — Progress Notes (Signed)
Pre visit review using our clinic review tool, if applicable. No additional management support is needed unless otherwise documented below in the visit note. 

## 2014-02-06 NOTE — Progress Notes (Signed)
No chief complaint on file.   HPI:  Polypharmacy:  -when pt started seeing me she was on many medications and felt sedated - wanted to stop some meds  -she reports she is doing great now -for her Depression and Anxiety: she takes celexa, ativan, trazadone and imipramine all prescribed by psychiatry  OA of the knees: -managed by Dr. Stann Ore at [facility] -mostly the L knee, stable -takes methocarbamol and celebrex of OA  HTN:  -on losartan and norvasc, asa  -denies: CP, SOB, DOE, swelling   HLD/Prediabetes:  -on lipitor  -denies: leg cramps, polyuria, polydipsia   Osteopenia/hx breast ca:  -followed by onc, reviewed recent notes - had CBC and CMP  -she is to continue Arimidex  -takes calcium and fosamax  Reports she recently had eye surgery. Doing well.  ROS: See pertinent positives and negatives per HPI.  Past Medical History  Diagnosis Date  . Arthritis   . Chicken pox   . Hyperlipidemia   . Osteopenia   . Hypertension   . Cancer     Breast cancer-Right  . Depression   . Glaucoma   . Anxiety     Past Surgical History  Procedure Laterality Date  . Tonsillectomy and adenoidectomy    . Rotator cuff surgery  2011  . Arthroscopy knee surgery      left  . Cataract surgery    . Breast surgery      Lumpectomy  . Spine surgery    . Vitrectomy Right 12/17/2013    DR MATTHEWS  . Pars plana vitrectomy Right 12/17/2013    Procedure: PARS PLANA VITRECTOMY WITH 25G REMOVAL/SUTURE INTRAOCULAR LENS, ENDOLASER;  Surgeon: Hayden Pedro, MD;  Location: [place];  Service: Ophthalmology;  Laterality: Right;  . Laser photo ablation Right 12/17/2013    Procedure: LASER PHOTO ABLATION;  Surgeon: Hayden Pedro, MD;  Location: [place];  Service: Ophthalmology;  Laterality: Right;    Family History  Problem Relation Age of Onset  . Lung cancer      husband  . Bone cancer Mother   . Leukemia Father     History   Social History  . Marital Status: Widowed    Spouse Name: N/A   Number of Children: N/A  . Years of Education: N/A   Social History Main Topics  . Smoking status: Former Smoker    Types: Cigarettes  . Smokeless tobacco: Never Used     Comment: "quit smoking cigarettes around 40 years ago"  . Alcohol Use: Yes     Comment: per pt a glass of white wine before dinner   . Drug Use: No  . Sexual Activity: Yes    Birth Control/ Protection: Post-menopausal   Other Topics Concern  . None   Social History Narrative  . None    Current outpatient prescriptions:alendronate (FOSAMAX) 35 MG tablet, Take 35 mg by mouth every Sunday. Take with a full glass of water on an empty stomach., Disp: , Rfl: ;  amLODipine (NORVASC) 10 MG tablet, Take 1 tablet (10 mg total) by mouth daily., Disp: 90 tablet, Rfl: 3;  anastrozole (ARIMIDEX) 1 MG tablet, Take 1 tablet (1 mg total) by mouth daily., Disp: 30 tablet, Rfl: 5;  aspirin 81 MG tablet, Take 81 mg by mouth daily., Disp: , Rfl:  atorvastatin (LIPITOR) 80 MG tablet, Take 0.5 tablets (40 mg total) by mouth daily., Disp: 45 tablet, Rfl: 3;  bacitracin-polymyxin b (POLYSPORIN) ophthalmic ointment, Place 1 application into the right eye  4 (four) times daily. apply to eye every 12 hours while awake, Disp: 3.5 g, Rfl: 0;  calcium carbonate (OS-CAL) 600 MG TABS, Take 600 mg by mouth 2 (two) times daily with a meal., Disp: , Rfl:  celecoxib (CELEBREX) 200 MG capsule, Take 200 mg by mouth daily. , Disp: , Rfl: ;  citalopram (CELEXA) 20 MG tablet, Take 20 mg by mouth at bedtime. , Disp: , Rfl: ;  gatifloxacin (ZYMAXID) 0.5 % SOLN, Place 1 drop into the right eye 4 (four) times daily., Disp: , Rfl: ;  GLUCOSAMINE-CHONDROITIN PO, Take 1 tablet by mouth every morning. , Disp: , Rfl: ;  imipramine (TOFRANIL) 50 MG tablet, Take 100 mg by mouth at bedtime. , Disp: , Rfl:  latanoprost (XALATAN) 0.005 % ophthalmic solution, Place 1 drop into the right eye at bedtime., Disp: 2.5 mL, Rfl: 12;  LORazepam (ATIVAN) 1 MG tablet, Take 1 mg by mouth at  bedtime. , Disp: , Rfl: ;  losartan (COZAAR) 100 MG tablet, Take 1 tablet (100 mg total) by mouth daily., Disp: 90 tablet, Rfl: 3;  methocarbamol (ROBAXIN) 500 MG tablet, Take 500 mg by mouth daily as needed for muscle spasms. , Disp: , Rfl:  Multiple Vitamins-Minerals (PRESERVISION AREDS PO), Take 1 tablet by mouth 2 (two) times daily., Disp: , Rfl: ;  prednisoLONE acetate (PRED FORTE) 1 % ophthalmic suspension, Place 1 drop into the right eye 4 (four) times daily., Disp: 5 mL, Rfl: 0;  Travoprost, BAK Free, (TRAVATAN Z) 0.004 % SOLN ophthalmic solution, Place 1 drop into both eyes at bedtime., Disp: , Rfl:  traZODone (DESYREL) 100 MG tablet, Take 100 mg by mouth at bedtime., Disp: , Rfl:   EXAM:  Filed Vitals:   02/06/14 1320  BP: 122/78  Pulse: 67  Temp: 98.4 F (36.9 C)    Body mass index is 33.13 kg/(m^2).  GENERAL: vitals reviewed and listed above, alert, oriented, appears well hydrated and in no acute distress  HEENT: atraumatic, conjunttiva clear, no obvious abnormalities on inspection of external nose and ears  NECK: no obvious masses on inspection  LUNGS: clear to auscultation bilaterally, no wheezes, rales or rhonchi, good air movement  CV: HRRR, no peripheral edema  MS: moves all extremities without noticeable abnormality  PSYCH: pleasant and cooperative, no obvious depression or anxiety  ASSESSMENT AND PLAN:  Discussed the following assessment and plan:  Hyperlipidemia - Plan: atorvastatin (LIPITOR) 80 MG tablet  Essential hypertension - Plan: amLODipine (NORVASC) 10 MG tablet, losartan (COZAAR) 100 MG tablet  Hyperglycemia  Anxiety/Insomnia - followed by Debbe Bales, psych nurse  Osteoarthritis of left knee, unspecified osteoarthritis type  -advised fasting labs - she wants to do at medicare visit in a few months -advised flu vaccine - flu vaccine today -advised healthy diet and regular exercise -Patient advised to return or notify a doctor immediately if  symptoms worsen or persist or new concerns arise.  Patient Instructions  -Schedule your [place] as you leave to day in about 2-3 months - come fasting to that appointment but please drink plenty water  We recommend the following healthy lifestyle measures: - eat a healthy diet consisting of lots of vegetables, fruits, beans, nuts, seeds, healthy meats such as white chicken and fish and whole grains.  - avoid fried foods, fast food, processed foods, sodas, red meet and other fattening foods.  - get a least 150 minutes of aerobic exercise per week.       Colin Benton R.

## 2014-02-07 ENCOUNTER — Telehealth: Payer: Self-pay | Admitting: Family Medicine

## 2014-02-07 NOTE — Telephone Encounter (Signed)
emmi emailed °

## 2014-02-24 DIAGNOSIS — M1712 Unilateral primary osteoarthritis, left knee: Secondary | ICD-10-CM | POA: Diagnosis not present

## 2014-03-03 ENCOUNTER — Encounter: Payer: Self-pay | Admitting: Family Medicine

## 2014-03-03 DIAGNOSIS — M1712 Unilateral primary osteoarthritis, left knee: Secondary | ICD-10-CM | POA: Diagnosis not present

## 2014-03-11 ENCOUNTER — Other Ambulatory Visit: Payer: Self-pay | Admitting: *Deleted

## 2014-03-11 DIAGNOSIS — M1712 Unilateral primary osteoarthritis, left knee: Secondary | ICD-10-CM | POA: Diagnosis not present

## 2014-03-11 NOTE — Telephone Encounter (Signed)
Refill request received for alendronate 35 mg weekly. Was to have repeat bone density test in Sept 2015-not scheduled. Will follow up with patient regarding this via MyChart message.

## 2014-03-13 ENCOUNTER — Encounter: Payer: Self-pay | Admitting: *Deleted

## 2014-03-21 NOTE — Telephone Encounter (Signed)
Attempted to reach patient at home # to discuss her bone density test. No answer and no machine to leave message.

## 2014-03-22 ENCOUNTER — Other Ambulatory Visit: Payer: Self-pay | Admitting: Family Medicine

## 2014-03-31 ENCOUNTER — Encounter (INDEPENDENT_AMBULATORY_CARE_PROVIDER_SITE_OTHER): Payer: Medicare Other | Admitting: Ophthalmology

## 2014-04-01 ENCOUNTER — Other Ambulatory Visit: Payer: Self-pay | Admitting: *Deleted

## 2014-04-04 ENCOUNTER — Telehealth: Payer: Self-pay | Admitting: Hematology

## 2014-04-04 ENCOUNTER — Telehealth: Payer: Self-pay | Admitting: Oncology

## 2014-04-04 ENCOUNTER — Encounter: Payer: Self-pay | Admitting: *Deleted

## 2014-04-04 ENCOUNTER — Encounter (INDEPENDENT_AMBULATORY_CARE_PROVIDER_SITE_OTHER): Payer: Medicare Other | Admitting: Ophthalmology

## 2014-04-04 DIAGNOSIS — H2703 Aphakia, bilateral: Secondary | ICD-10-CM

## 2014-04-04 DIAGNOSIS — H35033 Hypertensive retinopathy, bilateral: Secondary | ICD-10-CM

## 2014-04-04 DIAGNOSIS — I10 Essential (primary) hypertension: Secondary | ICD-10-CM

## 2014-04-04 DIAGNOSIS — H43812 Vitreous degeneration, left eye: Secondary | ICD-10-CM

## 2014-04-04 DIAGNOSIS — H3531 Nonexudative age-related macular degeneration: Secondary | ICD-10-CM | POA: Diagnosis not present

## 2014-04-04 NOTE — Telephone Encounter (Signed)
, °

## 2014-04-04 NOTE — Progress Notes (Signed)
ON 03/10/14 PT. RECEIVED A THREE MONTH SUPPLY OF ALENDRONATE [FOSAMAX]. PT. HAS NOT HAD HER BONE DENSITY STUDY DONE FOR THIS YEAR NOR A HER SIX MONTH FOLLOW UP APPOINTMENT. THIS INFORMATION WAS GIVEN TO THE BREAST NAVIGATOR, Carl Best, TO CONTACT PT.

## 2014-04-07 DIAGNOSIS — F3342 Major depressive disorder, recurrent, in full remission: Secondary | ICD-10-CM | POA: Diagnosis not present

## 2014-04-09 ENCOUNTER — Telehealth: Payer: Self-pay | Admitting: *Deleted

## 2014-04-09 ENCOUNTER — Other Ambulatory Visit: Payer: Self-pay | Admitting: *Deleted

## 2014-04-09 DIAGNOSIS — M8580 Other specified disorders of bone density and structure, unspecified site: Secondary | ICD-10-CM

## 2014-04-09 MED ORDER — ALENDRONATE SODIUM 35 MG PO TABS: 35.0000 mg | ORAL_TABLET | ORAL | Status: DC

## 2014-04-09 NOTE — Telephone Encounter (Signed)
Received call back from patient and she states that her bone density is scheduled for 05/15/14 and she is not having problems or issues.  She is scheduled to see Dr. Burr Medico 09/04/14.  We will refill her fosamax until she comes in to see Dr. Burr Medico.  Patient knows to call sooner if she has any problems.

## 2014-04-09 NOTE — Telephone Encounter (Signed)
Left message for a return phone call.  Awaiting patient response.

## 2014-04-15 DIAGNOSIS — Z79899 Other long term (current) drug therapy: Secondary | ICD-10-CM | POA: Diagnosis not present

## 2014-04-28 ENCOUNTER — Telehealth: Payer: Self-pay

## 2014-04-28 ENCOUNTER — Other Ambulatory Visit: Payer: Self-pay | Admitting: Family Medicine

## 2014-04-28 NOTE — Telephone Encounter (Signed)
Rx sent 12/28 by Dr Maudie Mercury.

## 2014-04-28 NOTE — Telephone Encounter (Signed)
Refill request  Losartan Potassium 100mg  tab #90  [facility] [address]

## 2014-05-09 ENCOUNTER — Encounter: Payer: Self-pay | Admitting: Family Medicine

## 2014-05-09 ENCOUNTER — Ambulatory Visit (INDEPENDENT_AMBULATORY_CARE_PROVIDER_SITE_OTHER): Payer: Medicare Other | Admitting: Family Medicine

## 2014-05-09 VITALS — BP 126/70 | HR 80 | Temp 97.5°F | Ht 63.0 in | Wt 185.6 lb

## 2014-05-09 DIAGNOSIS — Z0000 Encounter for general adult medical examination without abnormal findings: Secondary | ICD-10-CM

## 2014-05-09 DIAGNOSIS — F329 Major depressive disorder, single episode, unspecified: Secondary | ICD-10-CM

## 2014-05-09 DIAGNOSIS — F419 Anxiety disorder, unspecified: Secondary | ICD-10-CM | POA: Diagnosis not present

## 2014-05-09 DIAGNOSIS — E785 Hyperlipidemia, unspecified: Secondary | ICD-10-CM | POA: Diagnosis not present

## 2014-05-09 DIAGNOSIS — M1712 Unilateral primary osteoarthritis, left knee: Secondary | ICD-10-CM

## 2014-05-09 DIAGNOSIS — R739 Hyperglycemia, unspecified: Secondary | ICD-10-CM | POA: Diagnosis not present

## 2014-05-09 DIAGNOSIS — M179 Osteoarthritis of knee, unspecified: Secondary | ICD-10-CM | POA: Diagnosis not present

## 2014-05-09 DIAGNOSIS — I10 Essential (primary) hypertension: Secondary | ICD-10-CM

## 2014-05-09 DIAGNOSIS — F32A Depression, unspecified: Secondary | ICD-10-CM

## 2014-05-09 LAB — BASIC METABOLIC PANEL
BUN: 32 mg/dL — AB (ref 6–23)
CHLORIDE: 107 meq/L (ref 96–112)
CO2: 26 mEq/L (ref 19–32)
Calcium: 9.5 mg/dL (ref 8.4–10.5)
Creatinine, Ser: 1.1 mg/dL (ref 0.4–1.2)
GFR: 50.85 mL/min — ABNORMAL LOW (ref >60.00)
GLUCOSE: 109 mg/dL — AB (ref 70–99)
POTASSIUM: 4.6 meq/L (ref 3.5–5.1)
Sodium: 141 mEq/L (ref 135–145)

## 2014-05-09 LAB — LIPID PANEL
Cholesterol: 182 mg/dL (ref 0–200)
HDL: 69.5 mg/dL (ref >39.00)
LDL Cholesterol: 85 mg/dL (ref 0–99)
NonHDL: 112.5
Total CHOL/HDL Ratio: 3
Triglycerides: 137 mg/dL (ref 0.0–149.0)
VLDL: 27.4 mg/dL (ref 0.0–40.0)

## 2014-05-09 LAB — HEMOGLOBIN A1C: HEMOGLOBIN A1C: 6.2 % (ref 4.6–6.5)

## 2014-05-09 NOTE — Progress Notes (Signed)
Medicare Annual Preventive Care Visit  (initial annual wellness or annual wellness exam)  Concerns and/or follow up today:  Polypharmacy:  -when pt started seeing me she was on many medications and felt sedated - wanted to stop some meds  -she reports she is doing great now  -for her Depression and Anxiety: she takes celexa, ativan, trazadone and imipramine all prescribed by psychiatry   OA of the knees:  -managed by Dr. Stann Ore at [facility]  -mostly the L knee, stable  -takes methocarbamol and celebrex of OA   HTN:  -on losartan and norvasc, asa  -denies: CP, SOB, DOE, swelling   HLD/Prediabetes:  -on lipitor  -denies: leg cramps, polyuria, polydipsia   Osteopenia/hx breast ca:  -followed by onc, reviewed recent notes - had CBC and CMP  -she is to continue Arimidex  -takes calcium and fosamax  Had eye surgery in august otherwise reports no changes   ROS: negative for report of fevers, unintentional weight loss, vision changes, vision loss, hearing loss or change, chest pain, sob, hemoptysis, melena, hematochezia, hematuria, genital discharge or lesions, falls, bleeding or bruising, loc, thoughts of suicide or self harm, memory loss  1.) Patient-completed health risk assessment  - completed and reviewed, see scanned documentation  2.) Review of Medical History: -PMH, PSH, Family History and current specialty and care providers reviewed and updated and listed below  - see scanned in document in chart and below  Past Medical History  Diagnosis Date  . Arthritis   . Chicken pox   . Hyperlipidemia   . Osteopenia   . Hypertension   . Cancer     Breast cancer-Right  . Depression   . Glaucoma   . Anxiety     Past Surgical History  Procedure Laterality Date  . Tonsillectomy and adenoidectomy    . Rotator cuff surgery  2011  . Arthroscopy knee surgery      left  . Cataract surgery    . Breast surgery      Lumpectomy  . Spine surgery    . Vitrectomy Right 12/17/2013     DR MATTHEWS  . Pars plana vitrectomy Right 12/17/2013    Procedure: PARS PLANA VITRECTOMY WITH 25G REMOVAL/SUTURE INTRAOCULAR LENS, ENDOLASER;  Surgeon: Hayden Pedro, MD;  Location: [place];  Service: Ophthalmology;  Laterality: Right;  . Laser photo ablation Right 12/17/2013    Procedure: LASER PHOTO ABLATION;  Surgeon: Hayden Pedro, MD;  Location: [place];  Service: Ophthalmology;  Laterality: Right;    History   Social History  . Marital Status: Widowed    Spouse Name: N/A    Number of Children: N/A  . Years of Education: N/A   Occupational History  . Not on file.   Social History Main Topics  . Smoking status: Former Smoker    Types: Cigarettes  . Smokeless tobacco: Never Used     Comment: "quit smoking cigarettes around 40 years ago"  . Alcohol Use: Yes     Comment: per pt a glass of white wine before dinner   . Drug Use: No  . Sexual Activity: Yes    Birth Control/ Protection: Post-menopausal   Other Topics Concern  . Not on file   Social History Narrative    The patient has a family history of  3.) Review of functional ability and level of safety:  Any difficulty hearing?  NO  History of falling? NO  Any trouble with IADLs - using a phone, using transportation, grocery  shopping, preparing meals, doing housework, doing laundry, taking medications and managing money? NO  Advance Directives? YES - living will/HCPOA Otto Herb (son) cell # [phone]  See summary of recommendations in Patient Instructions below.  4.) Physical Exam Filed Vitals:   05/09/14 1112  BP: 126/70  Pulse: 80  Temp: 97.5 F (36.4 C)   Estimated body mass index is 32.89 kg/(m^2) as calculated from the following:   Height as of this encounter: 5\' 3"  (1.6 m).   Weight as of this encounter: 185 lb 9.6 oz (84.188 kg).  EKG (optional): deferred  General: alert, appear well hydrated and in no acute distress  HEENT: visual acuity grossly intact  CV: HRRR  Lungs: CTA  bilaterally  Psych: pleasant and cooperative, no obvious depression or anxiety  Mini Cog:  Patient Score: NEG  POS  See patient instructions for recommendations.  Education and counseling regarding the above review of health provided with a plan for the following: -see scanned patient completed form for further details -fall prevention strategies discussed  -healthy lifestyle discussed -importance and resources for completing advanced directives discussed -see patient instructions below for any other recommendations provided  4)The following written screening schedule of preventive measures were reviewed with assessment and plan made per below, orders and patient instructions:      AAA screening: n/a     Alcohol screening: done see scanned sheet     Obesity Screening and counseling: done     STI screening: declined     Tobacco Screening: done see scanned sheet       Pneumococcal (PPSV23 -one dose after 64, one before if risk factors), influenza yearly and hepatitis B vaccines (if high risk - end stage renal disease, IV drugs, homosexual men, live in home for mentally retarded, hemophilia receiving factors) ASSESSMENT/PLAN: done      Screening mammograph (yearly if >40) ASSESSMENT/PLAN: advised to schedule yearly - she reports is scheduled      Screening Pap smear/pelvic exam (q2 years) ASSESSMENT/PLAN: n/a      Prostate cancer screening ASSESSMENT/PLAN: n/a      Colorectal cancer screening (FOBT yearly or flex sig q4y or colonoscopy q10y or barium enema q4y) ASSESSMENT/PLAN: n/a      Diabetes outpatient self-management training services ASSESSMENT/PLAN: n/a      Bone mass measurements(covered q2y if indicated - estrogen def, osteoporosis, hyperparathyroid, vertebral abnormalities, osteoporosis or steroids) ASSESSMENT/PLAN: managed by her onc, done  - repeating this week      Screening for glaucoma(q1y if high risk - diabetes, FH, AA and > 50 or hispanic and >  65) ASSESSMENT/PLAN: sees optho      Medical nutritional therapy for individuals with diabetes or renal disease ASSESSMENT/PLAN: n/a      Cardiovascular screening blood tests (lipids q5y) ASSESSMENT/PLAN: FASTING labs today      Diabetes screening tests ASSESSMENT/PLAN: FASTING labs today   7.) Summary: -risk factors and conditions per above assessment were discussed and treatment, recommendations and referrals were offered per documentation above and orders and patient instructions.  Medicare annual wellness visit, subsequent  Hyperlipidemia - Plan: Lipid Panel  Essential hypertension - Plan: Basic metabolic panel  Hyperglycemia - Plan: Hemoglobin A1c  Anxiety/Insomnia - followed by Debbe Bales, psych nurse  Osteoarthritis of left knee, unspecified osteoarthritis type  Depression  Patient Instructions   Before you leave: Labs  -We have ordered labs or studies at this visit. It can take up to 1-2 weeks for results and processing. We will contact you  with instructions IF your results are abnormal. Normal results will be released to your [facility]. If you have not heard from [place] or can not find your results in [facility] in 2 weeks please contact our office.  -PLEASE SIGN UP FOR MYCHART TODAY   We recommend the following healthy lifestyle measures: - eat a healthy diet consisting of lots of vegetables, fruits, beans, nuts, seeds, healthy meats such as white chicken and fish and whole grains.  - avoid fried foods, fast food, processed foods, sodas, red meet and other fattening foods.  - get a least 150 minutes of aerobic exercise per week.    Follow up in 4-6 months    -FASTING labs - has not had anything to drink in 16 hours -getting mammo and dexa with onc -follow up 6 months

## 2014-05-09 NOTE — Progress Notes (Signed)
living will/HCPOA Stephanie Dickerson (son) cell # [phone] per pt report

## 2014-05-09 NOTE — Patient Instructions (Addendum)
  Before you leave: Labs  -We have ordered labs or studies at this visit. It can take up to 1-2 weeks for results and processing. We will contact you with instructions IF your results are abnormal. Normal results will be released to your [facility]. If you have not heard from [place] or can not find your results in [facility] in 2 weeks please contact our office.  -PLEASE SIGN UP FOR MYCHART TODAY   We recommend the following healthy lifestyle measures: - eat a healthy diet consisting of lots of vegetables, fruits, beans, nuts, seeds, healthy meats such as white chicken and fish and whole grains.  - avoid fried foods, fast food, processed foods, sodas, red meet and other fattening foods.  - get a least 150 minutes of aerobic exercise per week.    Follow up in 4-6 months

## 2014-05-09 NOTE — Progress Notes (Signed)
Pre visit review using our clinic review tool, if applicable. No additional management support is needed unless otherwise documented below in the visit note. 

## 2014-06-12 ENCOUNTER — Ambulatory Visit (INDEPENDENT_AMBULATORY_CARE_PROVIDER_SITE_OTHER): Payer: Medicare Other | Admitting: Family Medicine

## 2014-06-12 ENCOUNTER — Encounter: Payer: Self-pay | Admitting: Family Medicine

## 2014-06-12 VITALS — BP 112/82 | HR 96 | Temp 98.0°F | Ht 63.0 in | Wt 187.2 lb

## 2014-06-12 DIAGNOSIS — J069 Acute upper respiratory infection, unspecified: Secondary | ICD-10-CM

## 2014-06-12 NOTE — Progress Notes (Signed)
Pre visit review using our clinic review tool, if applicable. No additional management support is needed unless otherwise documented below in the visit note. 

## 2014-06-12 NOTE — Patient Instructions (Signed)
INSTRUCTIONS FOR UPPER RESPIRATORY INFECTION:  -plenty of rest and fluids  -nasal saline wash 2-3 times daily (use prepackaged nasal saline or bottled/distilled water if making your own)   -can use AFRIN nasal spray for drainage and nasal congestion - but do NOT use longer then 3-4 days  -can use tylenol for aches and sorethroat  -in the winter time, using a humidifier at night is helpful (please follow cleaning instructions)  -if you are taking a cough medication - use only as directed, may also try a teaspoon of honey to coat the throat and throat lozenges  -for sore throat, salt water gargles can help  -follow up if you have fevers, facial pain, tooth pain, difficulty breathing or are worsening or not getting better in 5-7 days

## 2014-06-12 NOTE — Progress Notes (Signed)
HPI:  URI: -started: 4 days ago -symptoms:nasal congestion, sore throat, cough -denies:fever, SOB, NVD, tooth pain -has tried: tylenol cold -sick contacts/travel/risks: denies flu exposure, tick exposure or or Ebola risks -Hx of: allergies ROS: See pertinent positives and negatives per HPI.  Past Medical History  Diagnosis Date  . Arthritis   . Chicken pox   . Hyperlipidemia   . Osteopenia   . Hypertension   . Cancer     Breast cancer-Right  . Depression   . Glaucoma   . Anxiety   . Osteoarthritis - followed by Dr. Lawana Chambers in [place] 02/02/2012  . Anxiety/Insomnia - followed by Debbe Bales, psych nurse 02/02/2012    Past Surgical History  Procedure Laterality Date  . Tonsillectomy and adenoidectomy    . Rotator cuff surgery  2011  . Arthroscopy knee surgery      left  . Cataract surgery    . Breast surgery      Lumpectomy  . Spine surgery    . Vitrectomy Right 12/17/2013    DR MATTHEWS  . Pars plana vitrectomy Right 12/17/2013    Procedure: PARS PLANA VITRECTOMY WITH 25G REMOVAL/SUTURE INTRAOCULAR LENS, ENDOLASER;  Surgeon: Hayden Pedro, MD;  Location: [place];  Service: Ophthalmology;  Laterality: Right;  . Laser photo ablation Right 12/17/2013    Procedure: LASER PHOTO ABLATION;  Surgeon: Hayden Pedro, MD;  Location: [place];  Service: Ophthalmology;  Laterality: Right;    Family History  Problem Relation Age of Onset  . Lung cancer      husband  . Bone cancer Mother   . Leukemia Father     History   Social History  . Marital Status: Widowed    Spouse Name: N/A  . Number of Children: N/A  . Years of Education: N/A   Social History Main Topics  . Smoking status: Former Smoker    Types: Cigarettes  . Smokeless tobacco: Never Used     Comment: "quit smoking cigarettes around 40 years ago"  . Alcohol Use: Yes     Comment: per pt a glass of white wine before dinner   . Drug Use: No  . Sexual Activity: Yes    Birth Control/ Protection: Post-menopausal    Other Topics Concern  . None   Social History Narrative     Current outpatient prescriptions:  .  alendronate (FOSAMAX) 35 MG tablet, Take 1 tablet (35 mg total) by mouth every Sunday. Take with a full glass of water on an empty stomach., Disp: 12 tablet, Rfl: 1 .  amLODipine (NORVASC) 10 MG tablet, Take 1 tablet (10 mg total) by mouth daily., Disp: 90 tablet, Rfl: 3 .  anastrozole (ARIMIDEX) 1 MG tablet, Take 1 tablet (1 mg total) by mouth daily., Disp: 30 tablet, Rfl: 5 .  aspirin 81 MG tablet, Take 81 mg by mouth daily., Disp: , Rfl:  .  atorvastatin (LIPITOR) 80 MG tablet, Take 0.5 tablets (40 mg total) by mouth daily., Disp: 45 tablet, Rfl: 3 .  bacitracin-polymyxin b (POLYSPORIN) ophthalmic ointment, Place 1 application into the right eye 4 (four) times daily. apply to eye every 12 hours while awake, Disp: 3.5 g, Rfl: 0 .  brimonidine (ALPHAGAN) 0.15 % ophthalmic solution, , Disp: , Rfl:  .  calcium carbonate (OS-CAL) 600 MG TABS, Take 600 mg by mouth 2 (two) times daily with a meal., Disp: , Rfl:  .  celecoxib (CELEBREX) 200 MG capsule, Take 200 mg by mouth daily. , Disp: ,  Rfl:  .  citalopram (CELEXA) 20 MG tablet, Take 20 mg by mouth at bedtime. , Disp: , Rfl:  .  gatifloxacin (ZYMAXID) 0.5 % SOLN, Place 1 drop into the right eye 4 (four) times daily., Disp: , Rfl:  .  GLUCOSAMINE-CHONDROITIN PO, Take 1 tablet by mouth every morning. , Disp: , Rfl:  .  imipramine (TOFRANIL) 50 MG tablet, Take 100 mg by mouth at bedtime. , Disp: , Rfl:  .  latanoprost (XALATAN) 0.005 % ophthalmic solution, Place 1 drop into the right eye at bedtime., Disp: 2.5 mL, Rfl: 12 .  LORazepam (ATIVAN) 1 MG tablet, Take 1 mg by mouth at bedtime. , Disp: , Rfl:  .  losartan (COZAAR) 100 MG tablet, Take 1 tablet (100 mg total) by mouth daily., Disp: 90 tablet, Rfl: 3 .  methocarbamol (ROBAXIN) 500 MG tablet, Take 500 mg by mouth daily as needed for muscle spasms. , Disp: , Rfl:  .  Multiple  Vitamins-Minerals (PRESERVISION AREDS PO), Take 1 tablet by mouth 2 (two) times daily., Disp: , Rfl:  .  prednisoLONE acetate (PRED FORTE) 1 % ophthalmic suspension, Place 1 drop into the right eye 4 (four) times daily., Disp: 5 mL, Rfl: 0 .  Travoprost, BAK Free, (TRAVATAN Z) 0.004 % SOLN ophthalmic solution, Place 1 drop into both eyes at bedtime., Disp: , Rfl:  .  traZODone (DESYREL) 100 MG tablet, Take 100 mg by mouth at bedtime., Disp: , Rfl:   EXAM:  Filed Vitals:   06/12/14 1352  BP: 112/82  Pulse: 96  Temp: 98 F (36.7 C)    Body mass index is 33.17 kg/(m^2).  GENERAL: vitals reviewed and listed above, alert, oriented, appears well hydrated and in no acute distress  HEENT: atraumatic, conjunttiva clear, no obvious abnormalities on inspection of external nose and ears, normal appearance of ear canals and TMs, clear nasal congestion, mild post oropharyngeal erythema with PND, no tonsillar edema or exudate, no sinus TTP  NECK: no obvious masses on inspection  LUNGS: clear to auscultation bilaterally, no wheezes, rales or rhonchi, good air movement  CV: HRRR, no peripheral edema  MS: moves all extremities without noticeable abnormality  PSYCH: pleasant and cooperative, no obvious depression or anxiety  ASSESSMENT AND PLAN:  Discussed the following assessment and plan:  Acute upper respiratory infection  -given HPI and exam findings today, a serious infection or illness is unlikely. We discussed potential etiologies, with VURI being most likely, and advised supportive care and monitoring. We discussed treatment side effects, likely course, antibiotic misuse, transmission, and signs of developing a serious illness. -of course, we advised to return or notify a doctor immediately if symptoms worsen or persist or new concerns arise.    There are no Patient Instructions on file for this visit.   Colin Benton R.

## 2014-06-18 ENCOUNTER — Other Ambulatory Visit: Payer: Self-pay | Admitting: Family Medicine

## 2014-07-04 DIAGNOSIS — M1712 Unilateral primary osteoarthritis, left knee: Secondary | ICD-10-CM | POA: Diagnosis not present

## 2014-07-21 ENCOUNTER — Other Ambulatory Visit: Payer: Self-pay | Admitting: *Deleted

## 2014-07-21 DIAGNOSIS — C50919 Malignant neoplasm of unspecified site of unspecified female breast: Secondary | ICD-10-CM

## 2014-07-21 MED ORDER — ANASTROZOLE 1 MG PO TABS: 1.0000 mg | ORAL_TABLET | Freq: Every day | ORAL | Status: DC

## 2014-07-21 NOTE — Telephone Encounter (Signed)
Patient is scheduled to be seen in April.  Will authorize one month anastrozole until she is seen.

## 2014-08-24 ENCOUNTER — Other Ambulatory Visit: Payer: Self-pay | Admitting: Hematology

## 2014-08-31 ENCOUNTER — Other Ambulatory Visit: Payer: Self-pay | Admitting: Hematology

## 2014-09-01 ENCOUNTER — Telehealth: Payer: Self-pay | Admitting: *Deleted

## 2014-09-01 NOTE — Telephone Encounter (Signed)
Left message at pt's home # to call back tomorrow regarding prescription for anastrozole.  (No need to refill since 5 years up in March 2016.)

## 2014-09-03 NOTE — Telephone Encounter (Signed)
Called pt back today & spoke with son & he reports that pt called back & someone also called her today from this office.  I did not find notation in chart regarding this.  Pt did get my message earlier & knows to stop arimidex.

## 2014-09-04 ENCOUNTER — Ambulatory Visit: Payer: Medicare Other | Admitting: Hematology

## 2014-09-04 ENCOUNTER — Telehealth: Payer: Self-pay | Admitting: *Deleted

## 2014-09-04 ENCOUNTER — Other Ambulatory Visit: Payer: Medicare Other

## 2014-09-04 NOTE — Telephone Encounter (Signed)
TC from patient to cancel her appt with Dr. Burr Medico and her lab appt tomorrow. She states she will call back in the next few days to re-schedule.

## 2014-09-05 ENCOUNTER — Ambulatory Visit: Payer: Medicare Other | Admitting: Hematology

## 2014-09-05 ENCOUNTER — Other Ambulatory Visit: Payer: Medicare Other

## 2014-09-15 DIAGNOSIS — M1712 Unilateral primary osteoarthritis, left knee: Secondary | ICD-10-CM | POA: Diagnosis not present

## 2014-09-22 DIAGNOSIS — M1712 Unilateral primary osteoarthritis, left knee: Secondary | ICD-10-CM | POA: Diagnosis not present

## 2014-09-30 DIAGNOSIS — M1712 Unilateral primary osteoarthritis, left knee: Secondary | ICD-10-CM | POA: Diagnosis not present

## 2014-10-07 ENCOUNTER — Telehealth: Payer: Self-pay | Admitting: *Deleted

## 2014-10-07 NOTE — Telephone Encounter (Signed)
TC from patient this am. She is ready to reschedule her appt with Dr. Burr Medico, along with her labs. See note from 09/04/14

## 2014-10-08 NOTE — Telephone Encounter (Signed)
Called pt 10/06/14 & left message to see if pt wanted to r/s her appt.  She does & was transferred to scheduler's vm to leave message to r/s appt.

## 2014-10-16 DIAGNOSIS — H4011X2 Primary open-angle glaucoma, moderate stage: Secondary | ICD-10-CM | POA: Diagnosis not present

## 2014-10-16 DIAGNOSIS — H3531 Nonexudative age-related macular degeneration: Secondary | ICD-10-CM | POA: Diagnosis not present

## 2014-10-27 ENCOUNTER — Other Ambulatory Visit: Payer: Self-pay

## 2014-11-04 DIAGNOSIS — M1712 Unilateral primary osteoarthritis, left knee: Secondary | ICD-10-CM | POA: Diagnosis not present

## 2014-11-11 DIAGNOSIS — F3342 Major depressive disorder, recurrent, in full remission: Secondary | ICD-10-CM | POA: Diagnosis not present

## 2014-11-21 ENCOUNTER — Other Ambulatory Visit: Payer: Self-pay | Admitting: *Deleted

## 2014-11-21 ENCOUNTER — Other Ambulatory Visit: Payer: Self-pay | Admitting: Hematology

## 2014-11-25 ENCOUNTER — Telehealth: Payer: Self-pay | Admitting: Hematology

## 2014-11-25 NOTE — Telephone Encounter (Signed)
Left message for patient re appointment for 8/5 and mailed schedule.

## 2014-12-04 ENCOUNTER — Other Ambulatory Visit: Payer: Self-pay | Admitting: *Deleted

## 2014-12-04 DIAGNOSIS — C50919 Malignant neoplasm of unspecified site of unspecified female breast: Secondary | ICD-10-CM

## 2014-12-05 ENCOUNTER — Encounter: Payer: Self-pay | Admitting: Hematology

## 2014-12-05 ENCOUNTER — Telehealth: Payer: Self-pay | Admitting: Hematology

## 2014-12-05 ENCOUNTER — Other Ambulatory Visit (HOSPITAL_BASED_OUTPATIENT_CLINIC_OR_DEPARTMENT_OTHER): Payer: Medicare Other

## 2014-12-05 ENCOUNTER — Ambulatory Visit (HOSPITAL_BASED_OUTPATIENT_CLINIC_OR_DEPARTMENT_OTHER): Payer: Medicare Other | Admitting: Hematology

## 2014-12-05 VITALS — BP 142/83 | HR 87 | Temp 98.5°F | Resp 18 | Ht 63.0 in | Wt 183.6 lb

## 2014-12-05 DIAGNOSIS — C50919 Malignant neoplasm of unspecified site of unspecified female breast: Secondary | ICD-10-CM

## 2014-12-05 DIAGNOSIS — Z853 Personal history of malignant neoplasm of breast: Secondary | ICD-10-CM

## 2014-12-05 DIAGNOSIS — C50911 Malignant neoplasm of unspecified site of right female breast: Secondary | ICD-10-CM

## 2014-12-05 LAB — CBC WITH DIFFERENTIAL/PLATELET
BASO%: 0.6 % (ref 0.0–2.0)
Basophils Absolute: 0.1 10*3/uL (ref 0.0–0.1)
EOS%: 3.1 % (ref 0.0–7.0)
Eosinophils Absolute: 0.2 10*3/uL (ref 0.0–0.5)
HEMATOCRIT: 38 % (ref 34.8–46.6)
HEMOGLOBIN: 12.3 g/dL (ref 11.6–15.9)
LYMPH#: 1.4 10*3/uL (ref 0.9–3.3)
LYMPH%: 18 % (ref 14.0–49.7)
MCH: 29.9 pg (ref 25.1–34.0)
MCHC: 32.4 g/dL (ref 31.5–36.0)
MCV: 92.4 fL (ref 79.5–101.0)
MONO#: 0.6 10*3/uL (ref 0.1–0.9)
MONO%: 7.2 % (ref 0.0–14.0)
NEUT#: 5.6 10*3/uL (ref 1.5–6.5)
NEUT%: 71.1 % (ref 38.4–76.8)
PLATELETS: 212 10*3/uL (ref 145–400)
RBC: 4.11 10*6/uL (ref 3.70–5.45)
RDW: 14.3 % (ref 11.2–14.5)
WBC: 7.9 10*3/uL (ref 3.9–10.3)

## 2014-12-05 LAB — COMPREHENSIVE METABOLIC PANEL (CC13)
ALBUMIN: 3.8 g/dL (ref 3.5–5.0)
ALK PHOS: 96 U/L (ref 40–150)
ALT: 30 U/L (ref 0–55)
ANION GAP: 8 meq/L (ref 3–11)
AST: 25 U/L (ref 5–34)
BUN: 28.6 mg/dL — ABNORMAL HIGH (ref 7.0–26.0)
CALCIUM: 9.7 mg/dL (ref 8.4–10.4)
CO2: 30 meq/L — AB (ref 22–29)
Chloride: 107 mEq/L (ref 98–109)
Creatinine: 1.2 mg/dL — ABNORMAL HIGH (ref 0.6–1.1)
EGFR: 40 mL/min/{1.73_m2} — AB (ref >90)
Glucose: 99 mg/dl (ref 70–140)
Potassium: 4.8 mEq/L (ref 3.5–5.1)
Sodium: 144 mEq/L (ref 136–145)
TOTAL PROTEIN: 6.3 g/dL — AB (ref 6.4–8.3)
Total Bilirubin: 0.47 mg/dL (ref 0.20–1.20)

## 2014-12-05 NOTE — Progress Notes (Signed)
[facility] OFFICE PROGRESS NOTE  CC  Stephanie Dickerson., DO [place] [address]  Chief complaint: Follow up visit for breast cancer  DIAGNOSIS:  As per previously documented note:   Stephanie Dickerson is a 81 year old [place] woman who underwent a screening mammogram on 11/06/2007, which showed a spiculated 1.5 cm isodense mass upper outer quadrant at 10 o'clock position 4 cm from the nipple.  Ultrasound confirmed the presence of a 1.8 cm mass.  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 The mass was low-grade, ER + 99%, PR + at 100%, proliferative index 8%, HER-2 was 1+.  Breast specific gamma imaging performed on 11/07/2007 showed the previously mentioned activity at similar location but the maximum uptake was about 1 cm.    PRIOR THERAPY:  The patient elected to undergo a right breast lumpectomy and sentinel lymph node evaluation on 11/28/2007.  Final pathology showed a 1.7 cm invasive ductal carcinoma, grade I of III, DCIS low grade with negative surgical margins.  3 sentinel lymph nodes were all negative as well.  She declined radiation therapy and started Tamoxifen in 01/2008.  She took Tamoxifen for 2 years and was switched to Arimidex, which was stopped in March 2016 after 5 years therapy    CURRENT THERAPY:  Observation  INTERVAL HISTORY: Stephanie Dickerson 81 y.o. female returns for annual breast cancer followup. She was last seen by Dr. Earnest Conroy in 08/2013, who has left the practice. She completed 5 years of Arimidex in March 2016, and stopped when she ran out of prescriptions. She has been doing well overall, she does have moderate arthritis, especially left knee pain, for which she wears a soft knee brace. She works around with a cane. She lives with her son who is quite a busy with his job. She is able to take Her daily living by herself, but not very  physically active. She comes in by herself today.   MEDICAL HISTORY: Past Medical History  Diagnosis Date  . Arthritis   . Chicken pox   . Hyperlipidemia   . Osteopenia   . Hypertension   . Cancer     Breast cancer-Right  . Depression   . Glaucoma   . Anxiety   . Osteoarthritis - followed by Dr. Lawana Chambers in Ortho 02/02/2012  . Anxiety/Insomnia - followed by Debbe Bales, psych nurse 02/02/2012    ALLERGIES:  is allergic to lactose intolerance (gi).  MEDICATIONS:  Current Outpatient Prescriptions  Medication Sig Dispense Refill  . alendronate (FOSAMAX) 35 MG tablet TAKE 1 TABLET (35 MG TOTAL) BY MOUTH EVERY SUNDAY. TAKE WITH A FULL GLASS OF WATER ON AN EMPTY STOMACH. 4 tablet 0  . amLODipine (NORVASC) 10 MG tablet Take 1 tablet (10 mg total) by mouth daily. 90 tablet 3  . anastrozole (ARIMIDEX) 1 MG tablet Take 1 tablet (1 mg total) by mouth daily. 30 tablet 0  . aspirin 81 MG tablet Take 81 mg by mouth daily.    Marland Kitchen atorvastatin (LIPITOR) 80 MG tablet Take 0.5 tablets (40 mg total) by mouth daily. 45 tablet 3  . calcium carbonate (OS-CAL) 600 MG TABS Take 600 mg by mouth 2 (two) times daily with a meal.    . celecoxib (CELEBREX) 200 MG capsule Take 200 mg by mouth daily.     . citalopram (CELEXA) 20 MG tablet Take 20 mg by mouth at bedtime.     Marland Kitchen  GLUCOSAMINE-CHONDROITIN PO Take 1 tablet by mouth every morning.     Marland Kitchen imipramine (TOFRANIL) 50 MG tablet Take 100 mg by mouth at bedtime.     Marland Kitchen LORazepam (ATIVAN) 1 MG tablet Take 1 mg by mouth at bedtime.     Marland Kitchen losartan (COZAAR) 100 MG tablet Take 1 tablet (100 mg total) by mouth daily. 90 tablet 3  . methocarbamol (ROBAXIN) 500 MG tablet Take 500 mg by mouth daily as needed for muscle spasms.     . Multiple Vitamins-Minerals (PRESERVISION AREDS PO) Take 1 tablet by mouth 2 (two) times daily.    . Travoprost, BAK Free, (TRAVATAN Z) 0.004 % SOLN ophthalmic solution Place 1 drop into both eyes at bedtime.    . traZODone (DESYREL) 100 MG  tablet Take 100 mg by mouth at bedtime.     No current facility-administered medications for this visit.    SURGICAL HISTORY:  Past Surgical History  Procedure Laterality Date  . Tonsillectomy and adenoidectomy    . Rotator cuff surgery  2011  . Arthroscopy knee surgery      left  . Cataract surgery    . Breast surgery      Lumpectomy  . Spine surgery    . Vitrectomy Right 12/17/2013    DR MATTHEWS  . Pars plana vitrectomy Right 12/17/2013    Procedure: PARS PLANA VITRECTOMY WITH 25G REMOVAL/SUTURE INTRAOCULAR LENS, ENDOLASER;  Surgeon: Hayden Pedro, MD;  Location: [facility];  Service: Ophthalmology;  Laterality: Right;  . Laser photo ablation Right 12/17/2013    Procedure: LASER PHOTO ABLATION;  Surgeon: Hayden Pedro, MD;  Location: [facility];  Service: Ophthalmology;  Laterality: Right;    REVIEW OF SYSTEMS:  Constitutional: Feels well.  Mildly limited due to arthritis.  Cardiovascular: No chest pain or shortness of breath.  Intermittent bilateral ankle edema that she relates to taking Norvasc.  Pulmonary: No cough or wheeze.  Gastrointestinal: No nausea, vomiting, or diarrhea. No bright red blood per rectum or change in bowel movement.  Genitourinary: No frequency, urgency, or dysuria. No vaginal bleeding or discharge.  Musculoskeletal: No myalgia.  Intermittent joint pain. Neurologic: No weakness, numbness, or change in gait.  Psychology: No depression, anxiety, or insomnia  HEALTH MAINTENANCE: Mammogram:  10/2012 Colonoscopy:  "Don't want another." Bone Density:  01/20/12 resulting low bone mass Pap Smear:  Managed by Dr. Cherylann Banas in the past Eye Exam: Annually and now more frequently due to glaucoma in the right eye Lipid Panel: Managed by Dr. Maudie Mercury  PHYSICAL EXAMINATION: Blood pressure 142/83, pulse 87, temperature 98.5 F (36.9 C), temperature source Oral, resp. rate 18, height _0  (1.6 m), weight 183 lb 9.6 oz (83.28 kg), SpO2 98 %. Body mass index is 32.53  kg/(m^2). ECOG PERFORMANCE STATUS: 2 General: Well developed, well nourished female in no acute distress. Alert and oriented x 3.  Head/Neck: Oropharynx clear.  Sclerae anicteric.  Supple without any enlargements.  Lymph node survey: No cervical, supraclavicular, or axillary adenopathy.  Cardiovascular: Regular rate and rhythm. S1 and S2 normal.  Mild systolic murmur noted.  Lungs: Clear to auscultation bilaterally. No wheezes/crackles/rhonchi noted.  Skin: No rashes or lesions present. Back: No CVA tenderness.  Abdomen: Abdomen soft, non-tender and obese. Active bowel sounds in all quadrants. No evidence of a fluid wave, organomegaly, or abdominal masses.  Breasts:  Right lumpectomy scar  well-healed. No masses felt in either breast. Bilateral axillary areas no lymphadenopathy appreciated Extremities: No bilateral cyanosis or clubbing.  Mild,  non-pitting edema noted bilaterally.   LABORATORY DATA: Lab Results  Component Value Date   WBC 7.9 12/05/2014   HGB 12.3 12/05/2014   HCT 38.0 12/05/2014   MCV 92.4 12/05/2014   PLT 212 12/05/2014      Chemistry      Component Value Date/Time   NA 144 12/05/2014 1230   NA 141 05/09/2014 1158   K 4.8 12/05/2014 1230   K 4.6 05/09/2014 1158   CL 107 05/09/2014 1158   CO2 30* 12/05/2014 1230   CO2 26 05/09/2014 1158   BUN 28.6* 12/05/2014 1230   BUN 32* 05/09/2014 1158   CREATININE 1.2* 12/05/2014 1230   CREATININE 1.1 05/09/2014 1158      Component Value Date/Time   CALCIUM 9.7 12/05/2014 1230   CALCIUM 9.5 05/09/2014 1158   ALKPHOS 96 12/05/2014 1230   ALKPHOS 77 09/27/2011 1331   AST 25 12/05/2014 1230   AST 17 09/27/2011 1331   ALT 30 12/05/2014 1230   ALT 15 09/27/2011 1331   BILITOT 0.47 12/05/2014 1230   BILITOT 0.3 09/27/2011 1331       ASSESSMENT: 81 year old [place] woman: 1. Right invasive ductal carcinoma, Stage I, Grade 1, ER+ 99%, PR+ 100%, Ki67 8%, HER2 -, (+) DCIS -She has been 7 years out of her initial  surgery, clinically doing very well, exam and her last mammogram in 2014 showed no evidence of recurrence. -She has finished 5 years of aromatase inhibitor, plus a few years of tamoxifen -We'll continue surveillance. Given her advanced age, she prefers to be followed up clinically, she prefers not to continue mammogram,which I think is very reasonable -Her today's lab are reviewed, unremarkable. I encouraged her to drink more water, since her creatinine is slightly elevated.  2. She'll continue follow-up her primary care physician Dr. Maudie Mercury for other medical issues  Follow-up I'll see her back in one year with labs.  All questions were answered. The patient knows to call the clinic with any problems, questions or concerns. We can certainly see the patient much sooner if necessary.  I spent 20 minutes counseling the patient face to face. The total time spent in the appointment was 30 minutes.   Truitt Merle  MD  12/05/2014, 1:07 PM

## 2014-12-05 NOTE — Telephone Encounter (Signed)
per pof to sch pt appt-gave pt copy of avs °

## 2014-12-20 ENCOUNTER — Other Ambulatory Visit: Payer: Self-pay | Admitting: Hematology

## 2014-12-22 ENCOUNTER — Other Ambulatory Visit: Payer: Self-pay | Admitting: *Deleted

## 2014-12-22 DIAGNOSIS — C50911 Malignant neoplasm of unspecified site of right female breast: Secondary | ICD-10-CM

## 2014-12-22 MED ORDER — ALENDRONATE SODIUM 35 MG PO TABS: 35.0000 mg | ORAL_TABLET | ORAL | Status: DC

## 2014-12-23 ENCOUNTER — Other Ambulatory Visit: Payer: Self-pay | Admitting: Hematology

## 2014-12-29 DIAGNOSIS — M1712 Unilateral primary osteoarthritis, left knee: Secondary | ICD-10-CM | POA: Diagnosis not present

## 2015-02-05 ENCOUNTER — Ambulatory Visit (INDEPENDENT_AMBULATORY_CARE_PROVIDER_SITE_OTHER): Payer: Medicare Other | Admitting: Family Medicine

## 2015-02-05 ENCOUNTER — Encounter: Payer: Self-pay | Admitting: Family Medicine

## 2015-02-05 VITALS — BP 124/60 | HR 83 | Temp 98.5°F | Ht 63.0 in | Wt 183.5 lb

## 2015-02-05 DIAGNOSIS — J309 Allergic rhinitis, unspecified: Secondary | ICD-10-CM

## 2015-02-05 DIAGNOSIS — H9201 Otalgia, right ear: Secondary | ICD-10-CM | POA: Diagnosis not present

## 2015-02-05 DIAGNOSIS — H6121 Impacted cerumen, right ear: Secondary | ICD-10-CM

## 2015-02-05 DIAGNOSIS — Z23 Encounter for immunization: Secondary | ICD-10-CM

## 2015-02-05 MED ORDER — FLUTICASONE PROPIONATE 50 MCG/ACT NA SUSP: 2.0000 | Freq: Every day | NASAL | Status: DC

## 2015-02-05 NOTE — Addendum Note (Signed)
Addended by: Agnes Lawrence on: 02/05/2015 01:20 PM   Modules accepted: Orders

## 2015-02-05 NOTE — Progress Notes (Signed)
HPI:  Acute visit for:  R ear discomfort: -started: 4 days ago -symptoms: R ear pressure and decreased hearing, also has had allergies - nasal congestion, pnd, sneezing -denies:fever, SOB, NVD, tooth pain -has tried: tylenol cold and sinus -sick contacts/travel/risks: denies flu exposure, tick exposure or or Ebola risks -Hx of: allergies  ROS: See pertinent positives and negatives per HPI.  Past Medical History  Diagnosis Date  . Arthritis   . Chicken pox   . Hyperlipidemia   . Osteopenia   . Hypertension   . Cancer ([place])     Breast cancer-Right  . Depression   . Glaucoma   . Anxiety   . Osteoarthritis - followed by Dr. Lawana Chambers in Ortho 02/02/2012  . Anxiety/Insomnia - followed by Debbe Bales, psych nurse 02/02/2012    Past Surgical History  Procedure Laterality Date  . Tonsillectomy and adenoidectomy    . Rotator cuff surgery  2011  . Arthroscopy knee surgery      left  . Cataract surgery    . Breast surgery      Lumpectomy  . Spine surgery    . Vitrectomy Right 12/17/2013    DR MATTHEWS  . Pars plana vitrectomy Right 12/17/2013    Procedure: PARS PLANA VITRECTOMY WITH 25G REMOVAL/SUTURE INTRAOCULAR LENS, ENDOLASER;  Surgeon: Hayden Pedro, MD;  Location: [facility];  Service: Ophthalmology;  Laterality: Right;  . Laser photo ablation Right 12/17/2013    Procedure: LASER PHOTO ABLATION;  Surgeon: Hayden Pedro, MD;  Location: [facility];  Service: Ophthalmology;  Laterality: Right;    Family History  Problem Relation Age of Onset  . Lung cancer      husband  . Bone cancer Mother   . Leukemia Father     Social History   Social History  . Marital Status: Widowed    Spouse Name: N/A  . Number of Children: N/A  . Years of Education: N/A   Social History Main Topics  . Smoking status: Former Smoker    Types: Cigarettes  . Smokeless tobacco: Never Used     Comment: "quit smoking cigarettes around 40 years ago"  . Alcohol Use: Yes     Comment: per pt a glass  of white wine before dinner   . Drug Use: No  . Sexual Activity: Yes    Birth Control/ Protection: Post-menopausal   Other Topics Concern  . None   Social History Narrative     Current outpatient prescriptions:  .  alendronate (FOSAMAX) 35 MG tablet, Take 1 tablet (35 mg total) by mouth every 7 (seven) days. Take with a full glass of water on an empty stomach., Disp: 12 tablet, Rfl: 0 .  amLODipine (NORVASC) 10 MG tablet, Take 1 tablet (10 mg total) by mouth daily., Disp: 90 tablet, Rfl: 3 .  anastrozole (ARIMIDEX) 1 MG tablet, Take 1 tablet (1 mg total) by mouth daily., Disp: 30 tablet, Rfl: 0 .  aspirin 81 MG tablet, Take 81 mg by mouth daily., Disp: , Rfl:  .  atorvastatin (LIPITOR) 80 MG tablet, Take 0.5 tablets (40 mg total) by mouth daily., Disp: 45 tablet, Rfl: 3 .  calcium carbonate (OS-CAL) 600 MG TABS, Take 600 mg by mouth 2 (two) times daily with a meal., Disp: , Rfl:  .  celecoxib (CELEBREX) 200 MG capsule, Take 200 mg by mouth daily. , Disp: , Rfl:  .  citalopram (CELEXA) 20 MG tablet, Take 20 mg by mouth at bedtime. , Disp: ,  Rfl:  .  docusate sodium (COLACE) 100 MG capsule, Take 200 mg by mouth daily., Disp: , Rfl:  .  GLUCOSAMINE-CHONDROITIN PO, Take 1 tablet by mouth every morning. , Disp: , Rfl:  .  imipramine (TOFRANIL) 50 MG tablet, Take 100 mg by mouth at bedtime. , Disp: , Rfl:  .  LORazepam (ATIVAN) 1 MG tablet, Take 1 mg by mouth at bedtime. , Disp: , Rfl:  .  losartan (COZAAR) 100 MG tablet, Take 1 tablet (100 mg total) by mouth daily., Disp: 90 tablet, Rfl: 3 .  methocarbamol (ROBAXIN) 500 MG tablet, Take 500 mg by mouth daily as needed for muscle spasms. , Disp: , Rfl:  .  Multiple Vitamins-Minerals (PRESERVISION AREDS PO), Take 1 tablet by mouth 2 (two) times daily., Disp: , Rfl:  .  Travoprost, BAK Free, (TRAVATAN Z) 0.004 % SOLN ophthalmic solution, Place 1 drop into both eyes at bedtime., Disp: , Rfl:  .  traZODone (DESYREL) 100 MG tablet, Take 150 mg by  mouth at bedtime. , Disp: , Rfl:  .  fluticasone (FLONASE) 50 MCG/ACT nasal spray, Place 2 sprays into both nostrils daily., Disp: 16 g, Rfl: 0  EXAM:  Filed Vitals:   02/05/15 1257  BP: 124/60  Pulse: 83  Temp: 98.5 F (36.9 C)    Body mass index is 32.51 kg/(m^2).  GENERAL: vitals reviewed and listed above, alert, oriented, appears well hydrated and in no acute distress  HEENT: atraumatic, conjunttiva clear, no obvious abnormalities on inspection of external nose and ears, normal appearance of ear canals and TMs except for soft cerumen impaction on R, clear nasal congestion, mild post oropharyngeal erythema with PND, no tonsillar edema or exudate, no sinus TTP  NECK: no obvious masses on inspection  LUNGS: clear to auscultation bilaterally, no wheezes, rales or rhonchi, good air movement  CV: HRRR, no peripheral edema  MS: moves all extremities without noticeable abnormality  PSYCH: pleasant and cooperative, no obvious depression or anxiety  ASSESSMENT AND PLAN:  Discussed the following assessment and plan:  Ear pain, right  Cerumen impaction, right  Allergic rhinitis, unspecified allergic rhinitis type   -discussed tx options for cerumen impaction and she decided to try ear drops first OTC then may consider lavage if this is not successful -for the allergic rhinitis she will start flonase -discussed HM measures due - she declined further mammograms, agreed to flu and prevnar 13 -follow up as needed -of course, we advised to return or notify a doctor immediately if symptoms worsen or persist or new concerns arise.    Patient Instructions  BEFORE YOU LEAVE: -flu and prevnar 13 shots -follow up in 3 months or sooner if ear issues persist  Use over the counter ear drops such as debrox, a few drops per day for one week in R ear, return to clinic if symptoms persist  Flonase nasal spray 2 sprays each nostril for 21 days     Michaelann Gunnoe R.

## 2015-02-05 NOTE — Patient Instructions (Signed)
BEFORE YOU LEAVE: -flu and prevnar 13 shots -follow up in 3 months or sooner if ear issues persist  Use over the counter ear drops such as debrox, a few drops per day for one week in R ear, return to clinic if symptoms persist  Flonase nasal spray 2 sprays each nostril for 21 days

## 2015-02-05 NOTE — Progress Notes (Signed)
Pre visit review using our clinic review tool, if applicable. No additional management support is needed unless otherwise documented below in the visit note. 

## 2015-03-03 DIAGNOSIS — M1712 Unilateral primary osteoarthritis, left knee: Secondary | ICD-10-CM | POA: Diagnosis not present

## 2015-03-13 DIAGNOSIS — M19012 Primary osteoarthritis, left shoulder: Secondary | ICD-10-CM | POA: Diagnosis not present

## 2015-03-16 ENCOUNTER — Other Ambulatory Visit: Payer: Self-pay | Admitting: Hematology

## 2015-04-16 ENCOUNTER — Other Ambulatory Visit: Payer: Self-pay | Admitting: Family Medicine

## 2015-05-25 DIAGNOSIS — M1712 Unilateral primary osteoarthritis, left knee: Secondary | ICD-10-CM | POA: Diagnosis not present

## 2015-05-25 DIAGNOSIS — M25562 Pain in left knee: Secondary | ICD-10-CM | POA: Diagnosis not present

## 2015-05-28 DIAGNOSIS — F3342 Major depressive disorder, recurrent, in full remission: Secondary | ICD-10-CM | POA: Diagnosis not present

## 2015-06-11 DIAGNOSIS — H524 Presbyopia: Secondary | ICD-10-CM | POA: Diagnosis not present

## 2015-06-11 DIAGNOSIS — H353131 Nonexudative age-related macular degeneration, bilateral, early dry stage: Secondary | ICD-10-CM | POA: Diagnosis not present

## 2015-06-11 DIAGNOSIS — H401132 Primary open-angle glaucoma, bilateral, moderate stage: Secondary | ICD-10-CM | POA: Diagnosis not present

## 2015-06-14 ENCOUNTER — Other Ambulatory Visit: Payer: Self-pay | Admitting: Family Medicine

## 2015-06-16 ENCOUNTER — Other Ambulatory Visit: Payer: Self-pay | Admitting: Family Medicine

## 2015-07-30 DIAGNOSIS — M1712 Unilateral primary osteoarthritis, left knee: Secondary | ICD-10-CM | POA: Diagnosis not present

## 2015-09-10 ENCOUNTER — Other Ambulatory Visit: Payer: Self-pay | Admitting: Family Medicine

## 2015-09-10 DIAGNOSIS — I10 Essential (primary) hypertension: Secondary | ICD-10-CM

## 2015-09-10 MED ORDER — AMLODIPINE BESYLATE 10 MG PO TABS
10.0000 mg | ORAL_TABLET | Freq: Every day | ORAL | Status: DC
Start: 2015-09-10 — End: 2015-10-12

## 2015-10-12 ENCOUNTER — Other Ambulatory Visit: Payer: Self-pay | Admitting: Family Medicine

## 2015-10-19 ENCOUNTER — Encounter: Payer: Self-pay | Admitting: Family Medicine

## 2015-10-19 ENCOUNTER — Ambulatory Visit (INDEPENDENT_AMBULATORY_CARE_PROVIDER_SITE_OTHER): Payer: Medicare Other | Admitting: Family Medicine

## 2015-10-19 VITALS — BP 120/68 | HR 74 | Temp 97.8°F | Ht 63.25 in | Wt 186.5 lb

## 2015-10-19 DIAGNOSIS — E785 Hyperlipidemia, unspecified: Secondary | ICD-10-CM | POA: Diagnosis not present

## 2015-10-19 DIAGNOSIS — F3342 Major depressive disorder, recurrent, in full remission: Secondary | ICD-10-CM

## 2015-10-19 DIAGNOSIS — M8580 Other specified disorders of bone density and structure, unspecified site: Secondary | ICD-10-CM

## 2015-10-19 DIAGNOSIS — Z0000 Encounter for general adult medical examination without abnormal findings: Secondary | ICD-10-CM

## 2015-10-19 DIAGNOSIS — R739 Hyperglycemia, unspecified: Secondary | ICD-10-CM

## 2015-10-19 DIAGNOSIS — I10 Essential (primary) hypertension: Secondary | ICD-10-CM | POA: Diagnosis not present

## 2015-10-19 LAB — CBC
HEMATOCRIT: 35.9 % — AB (ref 36.0–46.0)
Hemoglobin: 11.9 g/dL — ABNORMAL LOW (ref 12.0–15.0)
MCHC: 33.3 g/dL (ref 30.0–36.0)
MCV: 91.2 fl (ref 78.0–100.0)
Platelets: 219 10*3/uL (ref 150.0–400.0)
RBC: 3.94 Mil/uL (ref 3.87–5.11)
RDW: 14.2 % (ref 11.5–15.5)
WBC: 8.8 10*3/uL (ref 4.0–10.5)

## 2015-10-19 LAB — BASIC METABOLIC PANEL
BUN: 31 mg/dL — AB (ref 6–23)
CHLORIDE: 106 meq/L (ref 96–112)
CO2: 24 meq/L (ref 19–32)
CREATININE: 1.17 mg/dL (ref 0.40–1.20)
Calcium: 9.2 mg/dL (ref 8.4–10.5)
GFR: 46.21 mL/min — ABNORMAL LOW (ref >60.00)
Glucose, Bld: 104 mg/dL — ABNORMAL HIGH (ref 70–99)
Potassium: 4.6 mEq/L (ref 3.5–5.1)
Sodium: 139 mEq/L (ref 135–145)

## 2015-10-19 LAB — LIPID PANEL
CHOLESTEROL: 167 mg/dL (ref 0–200)
HDL: 78.9 mg/dL (ref >39.00)
LDL Cholesterol: 68 mg/dL (ref 0–99)
NonHDL: 87.87
Total CHOL/HDL Ratio: 2
Triglycerides: 97 mg/dL (ref 0.0–149.0)
VLDL: 19.4 mg/dL (ref 0.0–40.0)

## 2015-10-19 LAB — HEMOGLOBIN A1C: Hgb A1c MFr Bld: 5.9 % (ref 4.6–6.5)

## 2015-10-19 NOTE — Progress Notes (Signed)
Pre visit review using our clinic review tool, if applicable. No additional management support is needed unless otherwise documented below in the visit note. 

## 2015-10-19 NOTE — Progress Notes (Signed)
Medicare Annual Preventive Care Visit  (initial annual wellness or annual wellness exam)  Concerns and/or follow up today:  Polypharmacy:  -when pt started seeing me she was on many medications and felt sedated - wanted to stop some meds  -she reports she is doing great now but wants to stop her fosamax  -for her Depression and Anxiety: she takes celexa, ativan, trazadone and imipramine all prescribed by psychiatry, [facility]   OA of the knees:  -managed by Dr. Stann Ore at [facility]  -mostly the L knee, stable  -takes methocarbamol and celebrex   HTN:  -on losartan and norvasc, asa  -denies: CP, SOB, DOE, swelling   HLD/Prediabetes:  -on lipitor  -denies: leg cramps, polyuria, polydipsia   Osteopenia/hx breast ca:  -followed by onc, reviewed recent notes -she is now off of Arimidex  -takes vit D and fosamax  ROS: negative for report of fevers, unintentional weight loss, vision changes, vision loss, hearing loss or change, chest pain, sob, hemoptysis, melena, hematochezia, hematuria, genital discharge or lesions, falls, bleeding or bruising, loc, thoughts of suicide or self harm, memory loss  1.) Patient-completed health risk assessment  - completed and reviewed, see scanned documentation  2.) Review of Medical History: -PMH, PSH, Family History and current specialty and care providers reviewed and updated and listed below  - see scanned in document in chart and below  Past Medical History  Diagnosis Date  . Arthritis   . Chicken pox   . Hyperlipidemia   . Osteopenia   . Hypertension   . Cancer ([place])     Breast cancer-Right  . Depression   . Glaucoma   . Anxiety   . Osteoarthritis - followed by Dr. Lawana Chambers in Ortho 02/02/2012  . Anxiety/Insomnia - followed by Debbe Bales, psych nurse 02/02/2012    Past Surgical History  Procedure Laterality Date  . Tonsillectomy and adenoidectomy    . Rotator cuff surgery  2011  . Arthroscopy knee surgery      left   . Cataract surgery    . Breast surgery      Lumpectomy  . Spine surgery    . Vitrectomy Right 12/17/2013    DR MATTHEWS  . Pars plana vitrectomy Right 12/17/2013    Procedure: PARS PLANA VITRECTOMY WITH 25G REMOVAL/SUTURE INTRAOCULAR LENS, ENDOLASER;  Surgeon: Hayden Pedro, MD;  Location: [place];  Service: Ophthalmology;  Laterality: Right;  . Laser photo ablation Right 12/17/2013    Procedure: LASER PHOTO ABLATION;  Surgeon: Hayden Pedro, MD;  Location: [place];  Service: Ophthalmology;  Laterality: Right;    Social History   Social History  . Marital Status: Widowed    Spouse Name: N/A  . Number of Children: N/A  . Years of Education: N/A   Occupational History  . Not on file.   Social History Main Topics  . Smoking status: Former Smoker    Types: Cigarettes  . Smokeless tobacco: Never Used     Comment: "quit smoking cigarettes around 40 years ago"  . Alcohol Use: Yes     Comment: per pt a glass of white wine before dinner   . Drug Use: No  . Sexual Activity: Yes    Birth Control/ Protection: Post-menopausal   Other Topics Concern  . Not on file   Social History Narrative    Family History  Problem Relation Age of Onset  . Lung cancer      husband  . Bone cancer Mother   .  Leukemia Father     Current Outpatient Prescriptions on File Prior to Visit  Medication Sig Dispense Refill  . alendronate (FOSAMAX) 35 MG tablet TAKE 1 TABLET (35 MG TOTAL) BY MOUTH EVERY 7 DAYS. TAKE WITH A FULL GLASS OF WATER ON AN EMPTY STOMACH. 12 tablet 2  . amLODipine (NORVASC) 10 MG tablet TAKE ONE TABLET BY MOUTH DAILY   -- NEEDS APPOINTMENT FOR PHYSICAL EXAM FOR REFILLS 30 tablet 0  . aspirin 81 MG tablet Take 81 mg by mouth daily.    Marland Kitchen atorvastatin (LIPITOR) 80 MG tablet TAKE 0.5 TABLETS (40 MG TOTAL) BY MOUTH DAILY. 45 tablet 2  . calcium carbonate (OS-CAL) 600 MG TABS Take 600 mg by mouth 2 (two) times daily with a meal.    . celecoxib (CELEBREX) 200 MG capsule Take 200 mg by  mouth daily.     . citalopram (CELEXA) 20 MG tablet Take 20 mg by mouth at bedtime.     . docusate sodium (COLACE) 100 MG capsule Take 200 mg by mouth daily.    . fluticasone (FLONASE) 50 MCG/ACT nasal spray Place 2 sprays into both nostrils daily. 16 g 0  . GLUCOSAMINE-CHONDROITIN PO Take 1 tablet by mouth every morning.     Marland Kitchen imipramine (TOFRANIL) 50 MG tablet Take 100 mg by mouth at bedtime.     Marland Kitchen LORazepam (ATIVAN) 1 MG tablet Take 1 mg by mouth at bedtime.     Marland Kitchen losartan (COZAAR) 100 MG tablet TAKE 1 TABLET (100 MG TOTAL) BY MOUTH DAILY. 90 tablet 2  . methocarbamol (ROBAXIN) 500 MG tablet Take 500 mg by mouth daily as needed for muscle spasms.     . Multiple Vitamins-Minerals (PRESERVISION AREDS PO) Take 1 tablet by mouth 2 (two) times daily.    . Travoprost, BAK Free, (TRAVATAN Z) 0.004 % SOLN ophthalmic solution Place 1 drop into both eyes at bedtime.    . traZODone (DESYREL) 100 MG tablet Take 150 mg by mouth at bedtime.      No current facility-administered medications on file prior to visit.     3.) Review of functional ability and level of safety:  Her son lives with her.  Any difficulty hearing?  NO - mild      History of falling?  NO  Any trouble with IADLs - using a phone, using transportation, grocery shopping, preparing meals, doing housework, doing laundry, taking medications and managing money? Clair Gulling helps her some, she drives still - she took a driver's test and she feels has not problems driving. Vision is good.  Advance Directives? Yes living will/HCPOA Otto Herb (son) cell # [phone]  See summary of recommendations in Patient Instructions below.  4.) Physical Exam Filed Vitals:   10/19/15 1128  BP: 120/68  Pulse: 74  Temp: 97.8 F (36.6 C)   Estimated body mass index is 32.76 kg/(m^2) as calculated from the following:   Height as of this encounter: 5' 3.25" (1.607 m).   Weight as of this encounter: 186 lb 8 oz (84.596 kg).  EKG (optional):  deferred  General: alert, appear well hydrated and in no acute distress  HEENT: visual acuity grossly intact  CV: HRRR  Lungs: CTA bilaterally  Psych: pleasant and cooperative, no obvious depression or anxiety  Cognitive function grossly intact  See patient instructions for recommendations.  Education and counseling regarding the above review of health provided with a plan for the following: -see scanned patient completed form for further details -fall prevention strategies  discussed  -healthy lifestyle discussed -importance and resources for completing advanced directives discussed -see patient instructions below for any other recommendations provided  4)The following written screening schedule of preventive measures were reviewed with assessment and plan made per below, orders and patient instructions:      AAA screening done if applicable n/a     Alcohol screening done     Obesity Screening and counseling done     STI screening (Hep C if born 49-65) decined     Tobacco Screening done        Pneumococcal (PPSV23 -one dose after 64, one before if risk factors), influenza yearly and hepatitis B vaccines (if high risk - end stage renal disease, IV drugs, homosexual men, live in home for mentally retarded, hemophilia receiving factors) ASSESSMENT/PLAN: done     Screening mammograph (yearly if >40) ASSESSMENT/PLAN: utd       Screening Pap smear/pelvic exam (q2 years) ASSESSMENT/PLAN: n/a, declined      Colorectal cancer screening (FOBT yearly or flex sig q4y or colonoscopy q10y or barium enema q4y) ASSESSMENT/PLAN: n/a      Diabetes outpatient self-management training services ASSESSMENT/PLAN: utd or done      Bone mass measurements(covered q2y if indicated - estrogen def, osteoporosis, hyperparathyroid, vertebral abnormalities, osteoporosis or steroids) ASSESSMENT/PLAN: on fosamax, managed by oncologist in the past; she does not want to take the medication any further  or do any more bone density tests      Screening for glaucoma(q1y if high risk - diabetes, FH, AA and > 50 or hispanic and > 65) ASSESSMENT/PLAN: sees optho once yearly      Medical nutritional therapy for individuals with diabetes or renal disease ASSESSMENT/PLAN: see orders      Cardiovascular screening blood tests (lipids q5y) ASSESSMENT/PLAN: see orders and labs      Diabetes screening tests ASSESSMENT/PLAN: see orders and labs   7.) Summary:   Medicare annual wellness visit, subsequent  Essential hypertension - Plan: Basic metabolic panel, CBC (no diff)  Hyperglycemia - Plan: Hemoglobin A1c  Hyperlipidemia - Plan: Lipid Panel  Osteopenia  Recurrent major depressive disorder, in full remission ([place])  -risk factors and conditions per above assessment were discussed and treatment, recommendations and referrals were offered per documentation above and orders and patient instructions. -she wants to stop bone density testing and treatment - she plans to continue vit d -FASTING labs today  Patient Instructions  BEFORE YOU LEAVE: -labs -follow up in 6 months  We have ordered labs or studies at this visit. It can take up to 1-2 weeks for results and processing. IF results require follow up or explanation, we will call you with instructions. Clinically stable results will be released to your [facility]. If you have not heard from [place] or cannot find your results in [facility] in 2 weeks please contact our office at [phone].  If you are not yet signed up for [facility], please consider signing up.  [facility] is a good place to go for audiology exam and hearing aides.  We recommend the following healthy lifestyle measures: - eat a healthy whole foods diet consisting of regular small meals composed of vegetables, fruits, beans, nuts, seeds, healthy meats such as white chicken and fish and whole grains.  - avoid sweets, white starchy foods, fried foods, fast food, processed foods, sodas,  red meet and other fattening foods.  - get a least 150-300 minutes of aerobic exercise per week.

## 2015-10-19 NOTE — Patient Instructions (Signed)
BEFORE YOU LEAVE: -labs -follow up in 6 months  We have ordered labs or studies at this visit. It can take up to 1-2 weeks for results and processing. IF results require follow up or explanation, we will call you with instructions. Clinically stable results will be released to your [facility]. If you have not heard from [place] or cannot find your results in [facility] in 2 weeks please contact our office at [phone].  If you are not yet signed up for [facility], please consider signing up.  [facility] is a good place to go for audiology exam and hearing aides.  We recommend the following healthy lifestyle measures: - eat a healthy whole foods diet consisting of regular small meals composed of vegetables, fruits, beans, nuts, seeds, healthy meats such as white chicken and fish and whole grains.  - avoid sweets, white starchy foods, fried foods, fast food, processed foods, sodas, red meet and other fattening foods.  - get a least 150-300 minutes of aerobic exercise per week.

## 2015-10-21 ENCOUNTER — Encounter: Payer: Self-pay | Admitting: Family Medicine

## 2015-10-22 ENCOUNTER — Encounter: Payer: Self-pay | Admitting: *Deleted

## 2015-10-26 DIAGNOSIS — M1712 Unilateral primary osteoarthritis, left knee: Secondary | ICD-10-CM | POA: Diagnosis not present

## 2015-11-11 ENCOUNTER — Other Ambulatory Visit: Payer: Self-pay | Admitting: Family Medicine

## 2015-11-11 NOTE — Telephone Encounter (Signed)
I reviewed patient's chart. It is not documented by provider that Amlodipine was discontinued, but when looking under discontinued medications tab, Amlodipine is listed. Ok to refill this medication?

## 2015-11-24 ENCOUNTER — Telehealth: Payer: Self-pay | Admitting: Hematology

## 2015-11-24 NOTE — Telephone Encounter (Signed)
Called patient to confirm appointment. Left message. Appointment letter and schedule mailed. Stephanie Dickerson.

## 2015-12-04 ENCOUNTER — Ambulatory Visit: Payer: Medicare Other | Admitting: Hematology

## 2015-12-04 ENCOUNTER — Other Ambulatory Visit: Payer: Medicare Other

## 2015-12-12 ENCOUNTER — Other Ambulatory Visit: Payer: Self-pay | Admitting: Hematology

## 2015-12-15 ENCOUNTER — Other Ambulatory Visit: Payer: Self-pay | Admitting: *Deleted

## 2015-12-15 DIAGNOSIS — C50911 Malignant neoplasm of unspecified site of right female breast: Secondary | ICD-10-CM

## 2015-12-15 MED ORDER — ALENDRONATE SODIUM 35 MG PO TABS: 35.0000 mg | ORAL_TABLET | ORAL | 2 refills | Status: DC

## 2015-12-17 DIAGNOSIS — F3342 Major depressive disorder, recurrent, in full remission: Secondary | ICD-10-CM | POA: Diagnosis not present

## 2015-12-18 ENCOUNTER — Telehealth: Payer: Self-pay | Admitting: Hematology

## 2015-12-18 ENCOUNTER — Other Ambulatory Visit: Payer: Self-pay | Admitting: Hematology

## 2015-12-18 ENCOUNTER — Encounter: Payer: Medicare Other | Admitting: Hematology

## 2015-12-18 ENCOUNTER — Other Ambulatory Visit: Payer: Medicare Other

## 2015-12-18 DIAGNOSIS — Z853 Personal history of malignant neoplasm of breast: Secondary | ICD-10-CM | POA: Insufficient documentation

## 2015-12-18 NOTE — Telephone Encounter (Signed)
8/18 Appointments cancelled per patient request. Will call to reschedule appointment.

## 2015-12-18 NOTE — Progress Notes (Signed)
This encounter was created in error - please disregard.

## 2016-01-01 ENCOUNTER — Ambulatory Visit: Payer: Medicare Other

## 2016-01-11 DIAGNOSIS — M1712 Unilateral primary osteoarthritis, left knee: Secondary | ICD-10-CM | POA: Diagnosis not present

## 2016-01-18 ENCOUNTER — Other Ambulatory Visit: Payer: Self-pay | Admitting: Family Medicine

## 2016-02-25 ENCOUNTER — Ambulatory Visit: Payer: Medicare Other | Admitting: *Deleted

## 2016-03-08 DIAGNOSIS — M19012 Primary osteoarthritis, left shoulder: Secondary | ICD-10-CM | POA: Diagnosis not present

## 2016-03-26 ENCOUNTER — Encounter: Payer: Self-pay | Admitting: Family Medicine

## 2016-03-31 ENCOUNTER — Ambulatory Visit (INDEPENDENT_AMBULATORY_CARE_PROVIDER_SITE_OTHER): Payer: Medicare Other | Admitting: Family Medicine

## 2016-03-31 ENCOUNTER — Encounter: Payer: Self-pay | Admitting: Family Medicine

## 2016-03-31 VITALS — BP 128/80 | HR 90 | Temp 97.9°F | Ht 63.25 in | Wt 185.4 lb

## 2016-03-31 DIAGNOSIS — F3342 Major depressive disorder, recurrent, in full remission: Secondary | ICD-10-CM | POA: Diagnosis not present

## 2016-03-31 DIAGNOSIS — D649 Anemia, unspecified: Secondary | ICD-10-CM

## 2016-03-31 DIAGNOSIS — R739 Hyperglycemia, unspecified: Secondary | ICD-10-CM

## 2016-03-31 DIAGNOSIS — F419 Anxiety disorder, unspecified: Secondary | ICD-10-CM

## 2016-03-31 DIAGNOSIS — I10 Essential (primary) hypertension: Secondary | ICD-10-CM

## 2016-03-31 DIAGNOSIS — Z23 Encounter for immunization: Secondary | ICD-10-CM | POA: Diagnosis not present

## 2016-03-31 DIAGNOSIS — R899 Unspecified abnormal finding in specimens from other organs, systems and tissues: Secondary | ICD-10-CM

## 2016-03-31 NOTE — Progress Notes (Signed)
Pre visit review using our clinic review tool, if applicable. No additional management support is needed unless otherwise documented below in the visit note. 

## 2016-03-31 NOTE — Progress Notes (Signed)
HPI:  Stephanie Dickerson is a very pleasant 82 year old with past medical history significant for depression and anxiety, retention, hyperlipidemia, mild hyperglycemia, breast cancer, osteoarthritis and osteopenia here for follow-up. She had a little anemia on her last set of labs and was advised to do stool cards and take a multivitamin. She did not do the stool cards, she reports she did not know she was supposed to. She would prefer not to and that she has to. She reports she feels great. She has been working with her psychiatrist to further reduce her medications. Denies chest pain, shortness of breath, swelling, melena, hematochezia or any worsening of her anxiety or depression. She is on Celebrex with her orthopedic specialist that she has been on this for many years. Does not wish to stop this.  Polypharmacy:  -when pt started seeing me she was on many medications and felt sedated - wanted to stop some meds  -she reports she is doing great now and continues to reduce medications with psychiatry   Depression and Anxiety:  -meds:ativan, trazadone and imipramine all prescribed by psychiatry -managed [facility]   OA of the knees:  -managed by Dr. Stann Ore at [facility]  -mostly the L knee, stable  -takes methocarbamol and celebrex   HTN:  -meds: losartan and norvasc, asa   HLD/Prediabetes:  -meds:  lipitor    Osteopenia/hx breast ca:  -followed by onc -she is now off of Arimidex  -takes vit D -she is trying to reduce meds and insisted on stopping fosamax  ROS: See pertinent positives and negatives per HPI.  Past Medical History:  Diagnosis Date  . Anxiety   . Anxiety/Insomnia - followed by Debbe Bales, psych nurse 02/02/2012  . Arthritis   . Cancer ([place])    Breast cancer-Right  . Chicken pox   . Depression   . Glaucoma   . Hyperlipidemia   . Hypertension   . Osteoarthritis - followed by Dr. Lawana Chambers in Ortho 02/02/2012  . Osteopenia     Past Surgical  History:  Procedure Laterality Date  . arthroscopy knee surgery     left  . BREAST SURGERY     Lumpectomy  . cataract surgery    . LASER PHOTO ABLATION Right 12/17/2013   Procedure: LASER PHOTO ABLATION;  Surgeon: Hayden Pedro, MD;  Location: [place];  Service: Ophthalmology;  Laterality: Right;  . PARS PLANA VITRECTOMY Right 12/17/2013   Procedure: PARS PLANA VITRECTOMY WITH 25G REMOVAL/SUTURE INTRAOCULAR LENS, ENDOLASER;  Surgeon: Hayden Pedro, MD;  Location: [place];  Service: Ophthalmology;  Laterality: Right;  . rotator cuff surgery  2011  . SPINE SURGERY    . TONSILLECTOMY AND ADENOIDECTOMY    . VITRECTOMY Right 12/17/2013   DR MATTHEWS    Family History  Problem Relation Age of Onset  . Lung cancer      husband  . Bone cancer Mother   . Leukemia Father     Social History   Social History  . Marital status: Widowed    Spouse name: N/A  . Number of children: N/A  . Years of education: N/A   Social History Main Topics  . Smoking status: Former Smoker    Types: Cigarettes  . Smokeless tobacco: Never Used     Comment: "quit smoking cigarettes around 40 years ago"  . Alcohol use Yes     Comment: per pt a glass of white wine before dinner   . Drug use: No  . Sexual activity: Yes  Birth control/ protection: Post-menopausal   Other Topics Concern  . None   Social History Narrative  . None     Current Outpatient Prescriptions:  .  amLODipine (NORVASC) 10 MG tablet, TAKE ONE TABLET BY MOUTH DAILY   -- NEEDS APPOINTMENT FOR PHYSICAL EXAM FOR REFILLS, Disp: 30 tablet, Rfl: 11 .  aspirin 81 MG tablet, Take 81 mg by mouth daily., Disp: , Rfl:  .  atorvastatin (LIPITOR) 80 MG tablet, TAKE 1/2 TABLETS (40 MG TOTAL) BY MOUTH DAILY., Disp: 45 tablet, Rfl: 2 .  celecoxib (CELEBREX) 200 MG capsule, Take 200 mg by mouth daily. , Disp: , Rfl:  .  citalopram (CELEXA) 20 MG tablet, Take 20 mg by mouth at bedtime. , Disp: , Rfl:  .  docusate sodium (COLACE) 100 MG capsule,  Take 200 mg by mouth daily., Disp: , Rfl:  .  fluticasone (FLONASE) 50 MCG/ACT nasal spray, Place 2 sprays into both nostrils daily., Disp: 16 g, Rfl: 0 .  GLUCOSAMINE-CHONDROITIN PO, Take 1 tablet by mouth every morning. , Disp: , Rfl:  .  imipramine (TOFRANIL) 50 MG tablet, Take 100 mg by mouth at bedtime. , Disp: , Rfl:  .  LORazepam (ATIVAN) 1 MG tablet, Take 1 mg by mouth at bedtime. , Disp: , Rfl:  .  losartan (COZAAR) 100 MG tablet, TAKE 1 TABLET (100 MG TOTAL) BY MOUTH DAILY., Disp: 90 tablet, Rfl: 2 .  methocarbamol (ROBAXIN) 500 MG tablet, Take 500 mg by mouth daily as needed for muscle spasms. , Disp: , Rfl:  .  Multiple Vitamins-Minerals (PRESERVISION AREDS PO), Take 1 tablet by mouth 2 (two) times daily., Disp: , Rfl:  .  Travoprost, BAK Free, (TRAVATAN Z) 0.004 % SOLN ophthalmic solution, Place 1 drop into both eyes at bedtime., Disp: , Rfl:  .  traZODone (DESYREL) 100 MG tablet, Take 150 mg by mouth at bedtime. , Disp: , Rfl:   EXAM:  Vitals:   03/31/16 1614  BP: 128/80  Pulse: 90  Temp: 97.9 F (36.6 C)    Body mass index is 32.58 kg/m.  GENERAL: vitals reviewed and listed above, alert, oriented, appears well hydrated and in no acute distress  HEENT: atraumatic, conjunttiva clear, no obvious abnormalities on inspection of external nose and ears  NECK: no obvious masses on inspection  LUNGS: clear to auscultation bilaterally, no wheezes, rales or rhonchi, good air movement  CV: HRRR, no peripheral edema  MS: moves all extremities without noticeable abnormality  PSYCH: pleasant and cooperative, no obvious depression or anxiety  ASSESSMENT AND PLAN:  Discussed the following assessment and plan:  Essential hypertension - Plan: Basic metabolic panel  Anemia, unspecified type - Plan: CBC with Differential/Platelets  Hyperglycemia  Recurrent major depressive disorder, in full remission ([place])  Anxiety/Insomnia - followed by Debbe Bales, psych nurse  -Repeat  labs today, stool cards and anemia, did advise that Celebrex could cause bleeding discussed other etiologies of anemia - advise she talk with her orthopedic doctor about other options for her arthritis -She prefers to minimize medications, she is working with her psychiatrist to reduce these -Flu shot today -Patient advised to return or notify a doctor immediately if symptoms worsen or persist or new concerns arise.  Patient Instructions  BEFORE YOU LEAVE: -flu shot -labs -follow up: 4 months  We have ordered labs or studies at this visit. It can take up to 1-2 weeks for results and processing. IF results require follow up or explanation, we will call you  with instructions. Clinically stable results will be released to your [facility]. If you have not heard from [place] or cannot find your results in [facility] in 2 weeks please contact our office at [phone].  If you are not yet signed up for [facility], please consider signing up.  Mechele Claude will let you know if you need to complete stool cards.         Colin Benton R., DO

## 2016-03-31 NOTE — Patient Instructions (Signed)
BEFORE YOU LEAVE: -flu shot -labs -follow up: 4 months  We have ordered labs or studies at this visit. It can take up to 1-2 weeks for results and processing. IF results require follow up or explanation, we will call you with instructions. Clinically stable results will be released to your [facility]. If you have not heard from [place] or cannot find your results in [facility] in 2 weeks please contact our office at [phone].  If you are not yet signed up for [facility], please consider signing up.  Mechele Claude will let you know if you need to complete stool cards.

## 2016-04-01 LAB — BASIC METABOLIC PANEL
BUN: 30 mg/dL — AB (ref 6–23)
CHLORIDE: 104 meq/L (ref 96–112)
CO2: 26 mEq/L (ref 19–32)
Calcium: 9.9 mg/dL (ref 8.4–10.5)
Creatinine, Ser: 1.32 mg/dL — ABNORMAL HIGH (ref 0.40–1.20)
GFR: 40.16 mL/min — AB (ref >60.00)
GLUCOSE: 103 mg/dL — AB (ref 70–99)
Potassium: 4.8 mEq/L (ref 3.5–5.1)
SODIUM: 141 meq/L (ref 135–145)

## 2016-04-01 LAB — CBC WITH DIFFERENTIAL/PLATELET
BASOS ABS: 0.1 10*3/uL (ref 0.0–0.1)
Basophils Relative: 0.5 % (ref 0.0–3.0)
EOS ABS: 0.2 10*3/uL (ref 0.0–0.7)
Eosinophils Relative: 1.9 % (ref 0.0–5.0)
HEMATOCRIT: 37 % (ref 36.0–46.0)
HEMOGLOBIN: 12.3 g/dL (ref 12.0–15.0)
LYMPHS PCT: 16.4 % (ref 12.0–46.0)
Lymphs Abs: 1.8 10*3/uL (ref 0.7–4.0)
MCHC: 33.2 g/dL (ref 30.0–36.0)
MCV: 92.4 fl (ref 78.0–100.0)
MONO ABS: 0.6 10*3/uL (ref 0.1–1.0)
Monocytes Relative: 5.7 % (ref 3.0–12.0)
Neutro Abs: 8.1 10*3/uL — ABNORMAL HIGH (ref 1.4–7.7)
Neutrophils Relative %: 75.5 % (ref 43.0–77.0)
Platelets: 233 10*3/uL (ref 150.0–400.0)
RBC: 4.01 Mil/uL (ref 3.87–5.11)
RDW: 14.4 % (ref 11.5–15.5)
WBC: 10.7 10*3/uL — AB (ref 4.0–10.5)

## 2016-04-05 NOTE — Addendum Note (Signed)
Addended by: Agnes Lawrence on: 04/05/2016 09:48 AM   Modules accepted: Orders

## 2016-04-07 ENCOUNTER — Encounter: Payer: Self-pay | Admitting: Family Medicine

## 2016-04-07 ENCOUNTER — Ambulatory Visit (INDEPENDENT_AMBULATORY_CARE_PROVIDER_SITE_OTHER): Payer: Medicare Other | Admitting: Family Medicine

## 2016-04-07 VITALS — BP 132/60 | HR 100 | Temp 98.3°F | Ht 63.25 in

## 2016-04-07 DIAGNOSIS — R109 Unspecified abdominal pain: Secondary | ICD-10-CM

## 2016-04-07 DIAGNOSIS — K625 Hemorrhage of anus and rectum: Secondary | ICD-10-CM | POA: Diagnosis not present

## 2016-04-07 NOTE — Progress Notes (Signed)
Pre visit review using our clinic review tool, if applicable. No additional management support is needed unless otherwise documented below in the visit note. Patient declines weight measurement today. 

## 2016-04-07 NOTE — Progress Notes (Signed)
HPI:  Stephanie Dickerson  For abdominal pain. This occurred 3 days ago. She has several loose bowels and some crampy abdominal pain and gas. She has had some anorexia as well. This is now all resolved. She did notice a small streak of blood on the toilet paper 3 days ago when she wiped after several bowel movements. She did not see any blood on the stool or in the toilet. She has a history of hemorrhoids. She has seen no further bleeding. Denies any melena, vomiting, inability to tolerate oral intake of food or any further abdominal pain today.  She thinks this is from eating ice cream, she reports she has a long history of lactose intolerance and has been eating ice cream prior to these symptoms.  ROS: See pertinent positives and negatives per HPI.  Past Medical History:  Diagnosis Date  . Anxiety   . Anxiety/Insomnia - followed by Stephanie Dickerson, psych nurse 02/02/2012  . Arthritis   . Cancer ([place])    Breast cancer-Right  . Chicken pox   . Depression   . Glaucoma   . Hyperlipidemia   . Hypertension   . Osteoarthritis - followed by Dr. Lawana Chambers in Ortho 02/02/2012  . Osteopenia     Past Surgical History:  Procedure Laterality Date  . arthroscopy knee surgery     left  . BREAST SURGERY     Lumpectomy  . cataract surgery    . LASER PHOTO ABLATION Right 12/17/2013   Procedure: LASER PHOTO ABLATION;  Surgeon: Hayden Pedro, MD;  Location: [facility];  Service: Ophthalmology;  Laterality: Right;  . PARS PLANA VITRECTOMY Right 12/17/2013   Procedure: PARS PLANA VITRECTOMY WITH 25G REMOVAL/SUTURE INTRAOCULAR LENS, ENDOLASER;  Surgeon: Hayden Pedro, MD;  Location: [facility];  Service: Ophthalmology;  Laterality: Right;  . rotator cuff surgery  2011  . SPINE SURGERY    . TONSILLECTOMY AND ADENOIDECTOMY    . VITRECTOMY Right 12/17/2013   DR MATTHEWS    Family History  Problem Relation Age of Onset  . Lung cancer      husband  . Bone cancer Mother   . Leukemia Father     Social History    Social History  . Marital status: Widowed    Spouse name: N/A  . Number of children: N/A  . Years of education: N/A   Social History Main Topics  . Smoking status: Former Smoker    Types: Cigarettes  . Smokeless tobacco: Never Used     Comment: "quit smoking cigarettes around 40 years ago"  . Alcohol use Yes     Comment: per pt a glass of white wine before dinner   . Drug use: No  . Sexual activity: Yes    Birth control/ protection: Post-menopausal   Other Topics Concern  . None   Social History Narrative  . None     Current Outpatient Prescriptions:  .  amLODipine (NORVASC) 10 MG tablet, TAKE ONE TABLET BY MOUTH DAILY   -- NEEDS APPOINTMENT FOR PHYSICAL EXAM FOR REFILLS, Disp: 30 tablet, Rfl: 11 .  aspirin 81 MG tablet, Take 81 mg by mouth daily., Disp: , Rfl:  .  atorvastatin (LIPITOR) 80 MG tablet, TAKE 1/2 TABLETS (40 MG TOTAL) BY MOUTH DAILY., Disp: 45 tablet, Rfl: 2 .  celecoxib (CELEBREX) 200 MG capsule, Take 200 mg by mouth daily. , Disp: , Rfl:  .  citalopram (CELEXA) 20 MG tablet, Take 20 mg by mouth at bedtime. , Disp: ,  Rfl:  .  docusate sodium (COLACE) 100 MG capsule, Take 200 mg by mouth daily., Disp: , Rfl:  .  fluticasone (FLONASE) 50 MCG/ACT nasal spray, Place 2 sprays into both nostrils daily., Disp: 16 g, Rfl: 0 .  GLUCOSAMINE-CHONDROITIN PO, Take 1 tablet by mouth every morning. , Disp: , Rfl:  .  imipramine (TOFRANIL) 50 MG tablet, Take 100 mg by mouth at bedtime. , Disp: , Rfl:  .  LORazepam (ATIVAN) 1 MG tablet, Take 1 mg by mouth at bedtime. , Disp: , Rfl:  .  losartan (COZAAR) 100 MG tablet, TAKE 1 TABLET (100 MG TOTAL) BY MOUTH DAILY., Disp: 90 tablet, Rfl: 2 .  methocarbamol (ROBAXIN) 500 MG tablet, Take 500 mg by mouth daily as needed for muscle spasms. , Disp: , Rfl:  .  Multiple Vitamins-Minerals (PRESERVISION AREDS PO), Take 1 tablet by mouth 2 (two) times daily., Disp: , Rfl:  .  Travoprost, BAK Free, (TRAVATAN Z) 0.004 % SOLN ophthalmic  solution, Place 1 drop into both eyes at bedtime., Disp: , Rfl:  .  traZODone (DESYREL) 100 MG tablet, Take 150 mg by mouth at bedtime. , Disp: , Rfl:   EXAM:  Vitals:   04/07/16 1344  BP: 132/60  Pulse: 100  Temp: 98.3 F (36.8 C)    There is no height or weight on file to calculate BMI.  GENERAL: vitals reviewed and listed above, alert, oriented, appears well hydrated and in no acute distress  HEENT: atraumatic, conjunttiva clear, no obvious abnormalities on inspection of external nose and ears  NECK: no obvious masses on inspection  LUNGS: clear to auscultation bilaterally, no wheezes, rales or rhonchi, good air movement  CV: HRRR, no peripheral edema  ABD: BS+, soft, NTTP   RECTUM: declined  MS: moves all extremities without noticeable abnormality  PSYCH: pleasant and cooperative, no obvious depression or anxiety  ASSESSMENT AND PLAN:  Discussed the following assessment and plan:  Abdominal pain, unspecified abdominal location  BRBPR (bright red blood per rectum)  - She is convinced this is from the dairy, and her symptoms have resolved, so this is likely - she has a history of hemorrhoids and salt only 1 small streak of blood on TP - I advised  A rectal exam today, but she declined, she agrees to do stool cards which she has at home and return - she agrees to follow up if any further symptoms off dairy -Patient advised to return or notify a doctor immediately if symptoms worsen or persist or new concerns arise.  There are no Patient Instructions on file for this visit.  Colin Benton R., DO

## 2016-04-12 ENCOUNTER — Telehealth: Payer: Self-pay | Admitting: Family Medicine

## 2016-04-12 NOTE — Telephone Encounter (Signed)
I called the pt to get more information and she stated she has only had 1 bowel movement since her last office visit.  Patient now states she will have a stomach cramp and notices clear-like fluid from the rectum occurred 5-6 times for the past 2-3 times during the day and denies any blood in the stool.  States she does have an appetite and is eating regular food and is concerned as she has not had a BM at al today.  Message sent to Dr Maudie Mercury.

## 2016-04-12 NOTE — Telephone Encounter (Signed)
Patient is having trouble having a bowel movement. Patient wants to know if she needs a appointment.   Contact Info: [phone]

## 2016-04-12 NOTE — Telephone Encounter (Signed)
I called the pt and informed her per Dr Maudie Mercury she is not sure what the clear fluid could be and if she is concerned we could schedule an appt to have her see someone else tomorrow as she may need stool tests and Dr Maudie Mercury is out of the office or she could wait and see how she feels and schedule for next week.  Patient stated she is concerned about a blockage and would prefer to see someone sooner.  Appt scheduled for tomorrow with Tommi Rumps at [facility].

## 2016-04-13 ENCOUNTER — Ambulatory Visit (INDEPENDENT_AMBULATORY_CARE_PROVIDER_SITE_OTHER): Payer: Medicare Other | Admitting: Adult Health

## 2016-04-13 ENCOUNTER — Encounter: Payer: Self-pay | Admitting: Adult Health

## 2016-04-13 ENCOUNTER — Ambulatory Visit (INDEPENDENT_AMBULATORY_CARE_PROVIDER_SITE_OTHER)
Admission: RE | Admit: 2016-04-13 | Discharge: 2016-04-13 | Disposition: A | Discharge status: Home / Self Care | Payer: Medicare Other | Source: Ambulatory Visit | Attending: Adult Health | Admitting: Adult Health

## 2016-04-13 VITALS — BP 156/70 | Temp 97.4°F | Ht 63.25 in | Wt 185.8 lb

## 2016-04-13 DIAGNOSIS — K5900 Constipation, unspecified: Secondary | ICD-10-CM | POA: Diagnosis not present

## 2016-04-13 DIAGNOSIS — R1084 Generalized abdominal pain: Secondary | ICD-10-CM

## 2016-04-13 DIAGNOSIS — R109 Unspecified abdominal pain: Secondary | ICD-10-CM | POA: Diagnosis not present

## 2016-04-13 NOTE — Progress Notes (Signed)
Subjective:    Patient ID: Stephanie Dickerson, female    DOB: 04-15-1924, 82 y.o.   MRN: [id number]  HPI  82 year old patient of Dr. Maudie Mercury who  has a past medical history of Anxiety; Anxiety/Insomnia - followed by Debbe Bales, psych nurse (02/02/2012); Arthritis; Cancer ([place]); Chicken pox; Depression; Glaucoma; Hyperlipidemia; Hypertension; Osteoarthritis - followed by Dr. Lawana Chambers in Ortho (02/02/2012); and Osteopenia. She presents to the office today for the acute complaint of abdominal pain and constipation. She last saw Dr. Maudie Mercury on Thursday December 7, her last bowel movement was Dec 6th. She reports that she is passing gas but she has not had a bowel movement since. She reports " the only thing that comes out of my rectum is a clear liquid." She reports that the only time she has this clear liquid is when she has flatulence.   She denies any blood in her stool or rectum. Also denies any nausea, vomiting or feeling acutely ill.   She is eating normally.   Review of Systems  Constitutional: Negative.   HENT: Negative.   Eyes: Negative.   Respiratory: Negative.   Cardiovascular: Negative.   Gastrointestinal: Positive for abdominal pain and constipation. Negative for abdominal distention, anal bleeding, blood in stool, diarrhea, nausea, rectal pain and vomiting.  Endocrine: Negative.   Genitourinary: Negative.   Musculoskeletal: Negative.   Skin: Negative.   Allergic/Immunologic: Negative.   Neurological: Negative.   Hematological: Negative.   Psychiatric/Behavioral: Negative.   All other systems reviewed and are negative.  Past Medical History:  Diagnosis Date  . Anxiety   . Anxiety/Insomnia - followed by Debbe Bales, psych nurse 02/02/2012  . Arthritis   . Cancer ([place])    Breast cancer-Right  . Chicken pox   . Depression   . Glaucoma   . Hyperlipidemia   . Hypertension   . Osteoarthritis - followed by Dr. Lawana Chambers in Ortho 02/02/2012  . Osteopenia     Social History    Social History  . Marital status: Widowed    Spouse name: N/A  . Number of children: N/A  . Years of education: N/A   Occupational History  . Not on file.   Social History Main Topics  . Smoking status: Former Smoker    Types: Cigarettes  . Smokeless tobacco: Never Used     Comment: "quit smoking cigarettes around 40 years ago"  . Alcohol use Yes     Comment: per pt a glass of white wine before dinner   . Drug use: No  . Sexual activity: Yes    Birth control/ protection: Post-menopausal   Other Topics Concern  . Not on file   Social History Narrative  . No narrative on file    Past Surgical History:  Procedure Laterality Date  . arthroscopy knee surgery     left  . BREAST SURGERY     Lumpectomy  . cataract surgery    . LASER PHOTO ABLATION Right 12/17/2013   Procedure: LASER PHOTO ABLATION;  Surgeon: Hayden Pedro, MD;  Location: [facility];  Service: Ophthalmology;  Laterality: Right;  . PARS PLANA VITRECTOMY Right 12/17/2013   Procedure: PARS PLANA VITRECTOMY WITH 25G REMOVAL/SUTURE INTRAOCULAR LENS, ENDOLASER;  Surgeon: Hayden Pedro, MD;  Location: [facility];  Service: Ophthalmology;  Laterality: Right;  . rotator cuff surgery  2011  . SPINE SURGERY    . TONSILLECTOMY AND ADENOIDECTOMY    . VITRECTOMY Right 12/17/2013   DR MATTHEWS  Family History  Problem Relation Age of Onset  . Lung cancer      husband  . Bone cancer Mother   . Leukemia Father     Allergies  Allergen Reactions  . Lactose Intolerance (Gi) Diarrhea    Current Outpatient Prescriptions on File Prior to Visit  Medication Sig Dispense Refill  . amLODipine (NORVASC) 10 MG tablet TAKE ONE TABLET BY MOUTH DAILY   -- NEEDS APPOINTMENT FOR PHYSICAL EXAM FOR REFILLS 30 tablet 11  . aspirin 81 MG tablet Take 81 mg by mouth daily.    Marland Kitchen atorvastatin (LIPITOR) 80 MG tablet TAKE 1/2 TABLETS (40 MG TOTAL) BY MOUTH DAILY. 45 tablet 2  . celecoxib (CELEBREX) 200 MG capsule Take 200 mg by mouth daily.      . citalopram (CELEXA) 20 MG tablet Take 20 mg by mouth at bedtime.     . docusate sodium (COLACE) 100 MG capsule Take 200 mg by mouth daily.    . fluticasone (FLONASE) 50 MCG/ACT nasal spray Place 2 sprays into both nostrils daily. 16 g 0  . GLUCOSAMINE-CHONDROITIN PO Take 1 tablet by mouth every morning.     Marland Kitchen imipramine (TOFRANIL) 50 MG tablet Take 100 mg by mouth at bedtime.     Marland Kitchen LORazepam (ATIVAN) 1 MG tablet Take 1 mg by mouth at bedtime.     Marland Kitchen losartan (COZAAR) 100 MG tablet TAKE 1 TABLET (100 MG TOTAL) BY MOUTH DAILY. 90 tablet 2  . methocarbamol (ROBAXIN) 500 MG tablet Take 500 mg by mouth daily as needed for muscle spasms.     . Multiple Vitamins-Minerals (PRESERVISION AREDS PO) Take 1 tablet by mouth 2 (two) times daily.    . Travoprost, BAK Free, (TRAVATAN Z) 0.004 % SOLN ophthalmic solution Place 1 drop into both eyes at bedtime.    . traZODone (DESYREL) 100 MG tablet Take 200 mg by mouth at bedtime.      No current facility-administered medications on file prior to visit.     BP (!) 156/70 (BP Location: Left Arm, Patient Position: Sitting, Cuff Size: Normal)   Temp 97.4 F (36.3 C) (Oral)   Ht 5' 3.25" (1.607 m)   Wt 185 lb 12.8 oz (84.3 kg)   BMI 32.65 kg/m       Objective:   Physical Exam  Constitutional: She is oriented to person, place, and time. She appears well-developed and well-nourished. No distress.  Cardiovascular: Normal rate, regular rhythm, normal heart sounds and intact distal pulses.  Exam reveals no gallop and no friction rub.   No murmur heard. Pulmonary/Chest: Effort normal and breath sounds normal. No respiratory distress. She has no wheezes. She has no rales. She exhibits no tenderness.  Abdominal: Soft. Normal appearance and bowel sounds are normal. She exhibits no distension and no mass. There is no hepatosplenomegaly or hepatomegaly. There is generalized tenderness. There is no rigidity, no rebound and no guarding.  Musculoskeletal: Normal  range of motion. She exhibits no edema, tenderness or deformity.  Neurological: She is alert and oriented to person, place, and time. She has normal reflexes. She displays normal reflexes. No cranial nerve deficit. She exhibits normal muscle tone. Coordination normal.  Skin: Skin is warm and dry. No rash noted. She is not diaphoretic. No erythema. No pallor.  Psychiatric: She has a normal mood and affect. Her behavior is normal. Judgment and thought content normal.  Nursing note and vitals reviewed.     Assessment & Plan:  1. Generalized abdominal pain -  likely due to constipation  - DG Abd 1 View; Future- need to r/o obstruction.   2. Constipation, unspecified constipation type - DG Abd 1 View; Future IMPRESSION: Air-filled loops of small and large bowel noted. Mild adynamic ileus cannot be excluded. No prominent bowel distention. Large amount of stool noted throughout the colon suggesting constipation.  - Advised magnesium citrate and clear liquid diet.  - Follow up in the ER if no bowel movement by tomorrow afternoon or sooner if symptoms worsen   Dorothyann Peng, NP

## 2016-04-15 ENCOUNTER — Telehealth: Payer: Self-pay | Admitting: Family Medicine

## 2016-04-15 NOTE — Telephone Encounter (Signed)
° ° ° °  Pt son call to say pt saw Tommi Rumps and he has some questions about the visit and would like a call back    [phone]

## 2016-04-15 NOTE — Telephone Encounter (Signed)
See below

## 2016-04-18 ENCOUNTER — Encounter: Payer: Self-pay | Admitting: Family Medicine

## 2016-04-18 NOTE — Telephone Encounter (Signed)
I called the pts son and informed him of the message below and he stated she has not had a bowel movement and I advised him to take her to the ER and he agreed.

## 2016-04-19 ENCOUNTER — Emergency Department (HOSPITAL_COMMUNITY)
Admission: EM | Admit: 2016-04-19 | Discharge: 2016-04-19 | Disposition: A | Discharge status: Home / Self Care | Payer: Medicare Other | Attending: Emergency Medicine | Admitting: Emergency Medicine

## 2016-04-19 ENCOUNTER — Encounter (HOSPITAL_COMMUNITY): Payer: Self-pay | Admitting: Emergency Medicine

## 2016-04-19 DIAGNOSIS — K5900 Constipation, unspecified: Secondary | ICD-10-CM | POA: Diagnosis present

## 2016-04-19 DIAGNOSIS — Z7982 Long term (current) use of aspirin: Secondary | ICD-10-CM | POA: Insufficient documentation

## 2016-04-19 DIAGNOSIS — I10 Essential (primary) hypertension: Secondary | ICD-10-CM | POA: Diagnosis not present

## 2016-04-19 DIAGNOSIS — Z87891 Personal history of nicotine dependence: Secondary | ICD-10-CM | POA: Diagnosis not present

## 2016-04-19 DIAGNOSIS — K5901 Slow transit constipation: Secondary | ICD-10-CM | POA: Insufficient documentation

## 2016-04-19 DIAGNOSIS — Z853 Personal history of malignant neoplasm of breast: Secondary | ICD-10-CM | POA: Diagnosis not present

## 2016-04-19 DIAGNOSIS — Z79899 Other long term (current) drug therapy: Secondary | ICD-10-CM | POA: Diagnosis not present

## 2016-04-19 LAB — BASIC METABOLIC PANEL
ANION GAP: 8 (ref 5–15)
BUN: 14 mg/dL (ref 6–20)
CALCIUM: 8.6 mg/dL — AB (ref 8.9–10.3)
CHLORIDE: 109 mmol/L (ref 101–111)
CO2: 25 mmol/L (ref 22–32)
Creatinine, Ser: 1.24 mg/dL — ABNORMAL HIGH (ref 0.44–1.00)
GFR calc non Af Amer: 37 mL/min — ABNORMAL LOW (ref >60)
GFR, EST AFRICAN AMERICAN: 43 mL/min — AB (ref >60)
Glucose, Bld: 120 mg/dL — ABNORMAL HIGH (ref 65–99)
POTASSIUM: 4.1 mmol/L (ref 3.5–5.1)
Sodium: 142 mmol/L (ref 135–145)

## 2016-04-19 LAB — CBC WITH DIFFERENTIAL/PLATELET
BASOS PCT: 0 %
Basophils Absolute: 0 10*3/uL (ref 0.0–0.1)
Eosinophils Absolute: 0.1 10*3/uL (ref 0.0–0.7)
Eosinophils Relative: 1 %
HEMATOCRIT: 31.9 % — AB (ref 36.0–46.0)
HEMOGLOBIN: 10.4 g/dL — AB (ref 12.0–15.0)
LYMPHS PCT: 14 %
Lymphs Abs: 1.2 10*3/uL (ref 0.7–4.0)
MCH: 29.6 pg (ref 26.0–34.0)
MCHC: 32.6 g/dL (ref 30.0–36.0)
MCV: 90.9 fL (ref 78.0–100.0)
MONOS PCT: 9 %
Monocytes Absolute: 0.7 10*3/uL (ref 0.1–1.0)
NEUTROS ABS: 6 10*3/uL (ref 1.7–7.7)
NEUTROS PCT: 76 %
Platelets: 221 10*3/uL (ref 150–400)
RBC: 3.51 MIL/uL — ABNORMAL LOW (ref 3.87–5.11)
RDW: 14.2 % (ref 11.5–15.5)
WBC: 8 10*3/uL (ref 4.0–10.5)

## 2016-04-19 LAB — POC OCCULT BLOOD, ED: Fecal Occult Bld: NEGATIVE

## 2016-04-19 NOTE — ED Provider Notes (Signed)
[facility] DEPT Provider Note   CSN: [id number] Arrival date & time: 04/19/16  0945   History   Chief Complaint Chief Complaint  Patient presents with  . Constipation    HPI Stephanie Dickerson is a 82 y.o. female.  Patient with no "good" bowel movement since about 12/07 which at that time was accompanied by generalized abdominal pain. Patient since then seen by PCP and instructed to use Mag Citrate after which she started having small amounts of liquid stool but feels that she did not have an adequate BM. Had abd X ray done showing large stool burden and possible adynamic ileus. Son called PCP to tell them still no BM and they were instructed to go to ED for evaluation. Patient with no abdominal pain currently, no nausea, vomiting, has been tolerating PO well, no blood per rectum or melena, no fevers or chills, no abdominal distention, no urinary symptoms.       Past Medical History:  Diagnosis Date  . Anxiety   . Anxiety/Insomnia - followed by Debbe Bales, psych nurse 02/02/2012  . Arthritis   . Cancer ([place])    Breast cancer-Right  . Chicken pox   . Depression   . Glaucoma   . Hyperlipidemia   . Hypertension   . Osteoarthritis - followed by Dr. Lawana Chambers in Ortho 02/02/2012  . Osteopenia     Patient Active Problem List   Diagnosis Date Noted  . History of breast cancer in female 12/18/2015  . Dislocated IOL (intraocular lens), posterior 12/12/2013  . Osteopenia   . Arthritis   . Cancer ([place])   . Hyperlipidemia 02/02/2012  . Hypertension 02/02/2012  . Hyperglycemia 02/02/2012  . Constipation 02/02/2012  . Anxiety/Insomnia - followed by Debbe Bales, psych nurse 02/02/2012  . Osteoarthritis - followed by Dr. Lawana Chambers in Ortho 02/02/2012  . Breast cancer ([place]) 02/02/2012    Past Surgical History:  Procedure Laterality Date  . arthroscopy knee surgery     left  . BREAST SURGERY     Lumpectomy  . cataract surgery    . LASER PHOTO ABLATION Right 12/17/2013   Procedure: LASER PHOTO ABLATION;  Surgeon: Hayden Pedro, MD;  Location: [facility];  Service: Ophthalmology;  Laterality: Right;  . PARS PLANA VITRECTOMY Right 12/17/2013   Procedure: PARS PLANA VITRECTOMY WITH 25G REMOVAL/SUTURE INTRAOCULAR LENS, ENDOLASER;  Surgeon: Hayden Pedro, MD;  Location: [facility];  Service: Ophthalmology;  Laterality: Right;  . rotator cuff surgery  2011  . SPINE SURGERY    . TONSILLECTOMY AND ADENOIDECTOMY    . VITRECTOMY Right 12/17/2013   DR MATTHEWS    OB History    Gravida Para Term Preterm AB Living   '3 3 3     3   '$ SAB TAB Ectopic Multiple Live Births                   Home Medications    Prior to Admission medications   Medication Sig Start Date End Date Taking? Authorizing Provider  amLODipine (NORVASC) 10 MG tablet TAKE ONE TABLET BY MOUTH DAILY   -- NEEDS APPOINTMENT FOR PHYSICAL EXAM FOR REFILLS 11/12/15  Yes Lucretia Kern, DO  aspirin 81 MG tablet Take 81 mg by mouth daily.   Yes Historical Provider, MD  atorvastatin (LIPITOR) 80 MG tablet TAKE 1/2 TABLETS (40 MG TOTAL) BY MOUTH DAILY. 01/18/16  Yes Lucretia Kern, DO  celecoxib (CELEBREX) 200 MG capsule Take 200 mg by mouth daily.  Yes Historical Provider, MD  citalopram (CELEXA) 20 MG tablet Take 20 mg by mouth at bedtime.  01/23/12  Yes Historical Provider, MD  docusate sodium (COLACE) 100 MG capsule Take 200 mg by mouth daily.   Yes Historical Provider, MD  fluticasone (FLONASE) 50 MCG/ACT nasal spray Place 2 sprays into both nostrils daily. 02/05/15  Yes Lucretia Kern, DO  GLUCOSAMINE-CHONDROITIN PO Take 1 tablet by mouth every morning.    Yes Historical Provider, MD  imipramine (TOFRANIL) 50 MG tablet Take 100 mg by mouth at bedtime.    Yes Historical Provider, MD  LORazepam (ATIVAN) 1 MG tablet Take 1 mg by mouth at bedtime.    Yes Historical Provider, MD  losartan (COZAAR) 100 MG tablet TAKE 1 TABLET (100 MG TOTAL) BY MOUTH DAILY. 01/18/16  Yes Lucretia Kern, DO  magnesium citrate solution Take 296  mLs by mouth once.   Yes Historical Provider, MD  methocarbamol (ROBAXIN) 500 MG tablet Take 500 mg by mouth daily as needed for muscle spasms.    Yes Historical Provider, MD  Multiple Vitamins-Minerals (PRESERVISION AREDS PO) Take 1 tablet by mouth 2 (two) times daily.   Yes Historical Provider, MD  Travoprost, BAK Free, (TRAVATAN Z) 0.004 % SOLN ophthalmic solution Place 1 drop into both eyes at bedtime.   Yes Historical Provider, MD  traZODone (DESYREL) 100 MG tablet Take 200 mg by mouth at bedtime.    Yes Historical Provider, MD    Family History Family History  Problem Relation Age of Onset  . Bone cancer Mother   . Leukemia Father   . Lung cancer      husband    Social History Social History  Substance Use Topics  . Smoking status: Former Smoker    Types: Cigarettes  . Smokeless tobacco: Never Used     Comment: "quit smoking cigarettes around 40 years ago"  . Alcohol use Yes     Comment: per pt a glass of white wine before dinner      Allergies   Lactose intolerance (gi)   Review of Systems Review of Systems  Constitutional: Negative for activity change, appetite change, chills and fever.  HENT: Negative for trouble swallowing.   Eyes: Negative for visual disturbance.  Respiratory: Negative for chest tightness, shortness of breath and wheezing.   Cardiovascular: Negative for chest pain, palpitations and leg swelling.  Gastrointestinal: Positive for constipation and diarrhea. Negative for abdominal distention, abdominal pain, anal bleeding, blood in stool, nausea and vomiting.  Genitourinary: Negative for difficulty urinating, dysuria and hematuria.  Musculoskeletal: Positive for arthralgias.  Skin: Negative for rash.  Neurological: Negative for dizziness, syncope, weakness, light-headedness, numbness and headaches.  Psychiatric/Behavioral: Negative for agitation and decreased concentration. The patient is not nervous/anxious.      Physical Exam Updated Vital  Signs BP 123/66 (BP Location: Right Arm)   Pulse 60   Temp 98.6 F (37 C) (Oral)   Resp 16   Ht '5\' 3"'$  (1.6 m)   Wt 83.9 kg   SpO2 96%   BMI 32.77 kg/m   Physical Exam  Constitutional: She is oriented to person, place, and time. She appears well-developed and well-nourished. No distress.  HENT:  Head: Normocephalic and atraumatic.  Eyes: Conjunctivae and EOM are normal.  Neck: Normal range of motion.  Cardiovascular: Normal rate and regular rhythm.  Exam reveals no gallop and no friction rub.   No murmur heard. Pulmonary/Chest: Effort normal. No respiratory distress. She has no wheezes. She has  no rales.  Abdominal: Soft. Bowel sounds are normal. She exhibits no distension and no mass. There is no tenderness. There is no guarding.  Genitourinary: Rectum normal. Rectal exam shows no fissure, no mass, no tenderness, anal tone normal and guaiac negative stool.  Genitourinary Comments: No appreciable stool burden in rectum  Musculoskeletal: Normal range of motion. She exhibits edema (minimal bilateral LE edema).  Neurological: She is alert and oriented to person, place, and time. No sensory deficit. She exhibits normal muscle tone.  Skin: Skin is warm and dry. Capillary refill takes less than 2 seconds. She is not diaphoretic. No erythema. No pallor.  Psychiatric: She has a normal mood and affect. Her behavior is normal. Judgment and thought content normal.     ED Treatments / Results  Labs (all labs ordered are listed, but only abnormal results are displayed) Labs Reviewed  BASIC METABOLIC PANEL - Abnormal; Notable for the following:       Result Value   Glucose, Bld 120 (*)    Creatinine, Ser 1.24 (*)    Calcium 8.6 (*)    GFR calc non Af Amer 37 (*)    GFR calc Af Amer 43 (*)    All other components within normal limits  CBC WITH DIFFERENTIAL/PLATELET - Abnormal; Notable for the following:    RBC 3.51 (*)    Hemoglobin 10.4 (*)    HCT 31.9 (*)    All other components  within normal limits  POC OCCULT BLOOD, ED    EKG  EKG Interpretation None       Radiology No results found.  Procedures Procedures (including critical care time)  Medications Ordered in ED Medications - No data to display   Initial Impression / Assessment and Plan / ED Course  I have reviewed the triage vital signs and the nursing notes.  Pertinent labs & imaging results that were available during my care of the patient were reviewed by me and considered in my medical decision making (see chart for details).  Clinical Course    Ms. Harral is a 82yo female presenting with complaints of constipation without abdominal pain or PO intolerance. On arrival patient was afebrile and with normal vital signs. Exam revealed good bowel sounds, no abdominal distention or focal tenderness; rectal exam did not reveal obstruction, mass, hemorrhoids or a rectal stool burden; and her FOBT was negative. CBC and BMP were unremarkable. Patient with no signs or symptoms concerning for further ED or hospital intervention for her constipation symptoms. With no stool impaction noted on rectal exam, enema and disimpaction are not indicated at this time. Per chart review, PCP had written out regimen including Miralax BID, fiber supplement, and glycerol suppository if necessary for patient to try and this was relayed to patient & son, and written in their d/c instructions. Patient agreeable with discharge to home with PCP follow up. Strict return precautions including fever, chills, PO intolerance, blood per rectum or melena were discussed and agreed upon with patient.   Patient with no further questions or concerns at this time.   Final Clinical Impressions(s) / ED Diagnoses   Final diagnoses:  Slow transit constipation    New Prescriptions Discharge Medication List as of 04/19/2016 12:46 PM       Alphonzo Grieve, MD 04/20/16 1239

## 2016-04-19 NOTE — Discharge Instructions (Signed)
For your constipation, take miralax twice a day every day and start taking a daily fiber supplement (like metamucil). The goal is to have some bowel movement every day.   If you feel like these measures are still not adequate, please take a glycerin suppository.  Please call your PCP and set up appointment to be seen later this week.   Continue being active.   If you start having abdominal pain and are not able to keep down foods, please be evaluated by your PCP or come back to the ER.

## 2016-04-19 NOTE — ED Triage Notes (Signed)
Pt arrives via POv from home with constipation since 12/7. Denies n/v/abdominal pain, fever. Reports decreased appetite. Tried mag citrate without relief. VSS.

## 2016-04-19 NOTE — ED Provider Notes (Signed)
I have personally seen and examined the patient. I have reviewed the documentation on PMH/FH/Soc Hx. I have discussed the plan of care with the resident and patient.  I have reviewed and agree with the resident's documentation. Please see associated encounter note.   EKG Interpretation None         Fatima Blank, MD 04/19/16 1248

## 2016-05-05 ENCOUNTER — Encounter: Payer: Self-pay | Admitting: Family Medicine

## 2016-05-06 ENCOUNTER — Other Ambulatory Visit: Payer: Medicare Other

## 2016-05-09 ENCOUNTER — Encounter: Payer: Self-pay | Admitting: Family Medicine

## 2016-05-11 DIAGNOSIS — M1712 Unilateral primary osteoarthritis, left knee: Secondary | ICD-10-CM | POA: Diagnosis not present

## 2016-05-12 NOTE — Telephone Encounter (Signed)
I called the pts son and informed him of the message below and he stated he will have the pt call back for a follow up visit.

## 2016-07-13 DIAGNOSIS — F3342 Major depressive disorder, recurrent, in full remission: Secondary | ICD-10-CM | POA: Diagnosis not present

## 2016-07-27 DIAGNOSIS — M1712 Unilateral primary osteoarthritis, left knee: Secondary | ICD-10-CM | POA: Diagnosis not present

## 2016-07-27 DIAGNOSIS — M19012 Primary osteoarthritis, left shoulder: Secondary | ICD-10-CM | POA: Diagnosis not present

## 2016-08-16 DIAGNOSIS — H52223 Regular astigmatism, bilateral: Secondary | ICD-10-CM | POA: Diagnosis not present

## 2016-08-16 DIAGNOSIS — H5213 Myopia, bilateral: Secondary | ICD-10-CM | POA: Diagnosis not present

## 2016-08-16 DIAGNOSIS — H401132 Primary open-angle glaucoma, bilateral, moderate stage: Secondary | ICD-10-CM | POA: Diagnosis not present

## 2016-08-16 DIAGNOSIS — H524 Presbyopia: Secondary | ICD-10-CM | POA: Diagnosis not present

## 2016-08-16 DIAGNOSIS — H353131 Nonexudative age-related macular degeneration, bilateral, early dry stage: Secondary | ICD-10-CM | POA: Diagnosis not present

## 2016-08-25 DIAGNOSIS — M25552 Pain in left hip: Secondary | ICD-10-CM | POA: Diagnosis not present

## 2016-09-18 ENCOUNTER — Encounter: Payer: Self-pay | Admitting: Family Medicine

## 2016-09-18 NOTE — Progress Notes (Signed)
HPI:  Acute visit for depression. PMH HTN, Anxiety, Depression, HLD, Osteoarthritis, Breast Ca. Reports seeing her psychiatrist tomorrow, but wanted my thoughts on her recent bought of depression. This started about 1-2 weeks ago after a course of steroids from her orthopedic doctor. She wonders if the steroids caused the mood change. Symptoms include mild worsening depressed mood, cog fog and anxiety.  No SI, hallucinations, fatigue, dizziness, fevers, malaise, CP, palpitations, NVD or any other concerns.  Due for AWV 10/2015. ROS: See pertinent positives and negatives per HPI.  Past Medical History:  Diagnosis Date  . Anxiety/Insomnia - followed by Debbe Bales, psych nurse 02/02/2012  . Cancer ([place])    Breast cancer-Right  . Chicken pox   . Depression   . Glaucoma   . Hyperlipidemia   . Hypertension   . Osteoarthritis - followed by Dr. Lawana Chambers in Ortho 02/02/2012  . Osteopenia     Past Surgical History:  Procedure Laterality Date  . arthroscopy knee surgery     left  . BREAST SURGERY     Lumpectomy  . cataract surgery    . LASER PHOTO ABLATION Right 12/17/2013   Procedure: LASER PHOTO ABLATION;  Surgeon: Hayden Pedro, MD;  Location: [employer];  Service: Ophthalmology;  Laterality: Right;  . PARS PLANA VITRECTOMY Right 12/17/2013   Procedure: PARS PLANA VITRECTOMY WITH 25G REMOVAL/SUTURE INTRAOCULAR LENS, ENDOLASER;  Surgeon: Hayden Pedro, MD;  Location: [place];  Service: Ophthalmology;  Laterality: Right;  . rotator cuff surgery  2011  . SPINE SURGERY    . TONSILLECTOMY AND ADENOIDECTOMY    . VITRECTOMY Right 12/17/2013   DR MATTHEWS    Family History  Problem Relation Age of Onset  . Bone cancer Mother   . Leukemia Father   . Lung cancer Unknown        husband    Social History   Social History  . Marital status: Widowed    Spouse name: N/A  . Number of children: N/A  . Years of education: N/A   Social History Main Topics  . Smoking status: Former Smoker   Types: Cigarettes  . Smokeless tobacco: Never Used     Comment: "quit smoking cigarettes around 40 years ago"  . Alcohol use Yes     Comment: per pt a glass of white wine before dinner   . Drug use: No  . Sexual activity: Yes    Birth control/ protection: Post-menopausal   Other Topics Concern  . None   Social History Narrative  . None     Current Outpatient Prescriptions:  .  amLODipine (NORVASC) 10 MG tablet, TAKE ONE TABLET BY MOUTH DAILY   -- NEEDS APPOINTMENT FOR PHYSICAL EXAM FOR REFILLS, Disp: 30 tablet, Rfl: 11 .  aspirin 81 MG tablet, Take 81 mg by mouth daily., Disp: , Rfl:  .  atorvastatin (LIPITOR) 80 MG tablet, TAKE 1/2 TABLETS (40 MG TOTAL) BY MOUTH DAILY., Disp: 45 tablet, Rfl: 2 .  celecoxib (CELEBREX) 200 MG capsule, Take 200 mg by mouth daily. , Disp: , Rfl:  .  citalopram (CELEXA) 20 MG tablet, Take 20 mg by mouth at bedtime. , Disp: , Rfl:  .  docusate sodium (COLACE) 100 MG capsule, Take 200 mg by mouth daily., Disp: , Rfl:  .  fluticasone (FLONASE) 50 MCG/ACT nasal spray, Place 2 sprays into both nostrils daily., Disp: 16 g, Rfl: 0 .  GLUCOSAMINE-CHONDROITIN PO, Take 1 tablet by mouth every morning. , Disp: , Rfl:  .  imipramine (TOFRANIL) 50 MG tablet, Take 100 mg by mouth at bedtime. , Disp: , Rfl:  .  LORazepam (ATIVAN) 1 MG tablet, Take 1 mg by mouth at bedtime. , Disp: , Rfl:  .  losartan (COZAAR) 100 MG tablet, TAKE 1 TABLET (100 MG TOTAL) BY MOUTH DAILY., Disp: 90 tablet, Rfl: 2 .  magnesium citrate solution, Take 296 mLs by mouth once., Disp: , Rfl:  .  methocarbamol (ROBAXIN) 500 MG tablet, Take 500 mg by mouth daily as needed for muscle spasms. , Disp: , Rfl:  .  Multiple Vitamins-Minerals (PRESERVISION AREDS PO), Take 1 tablet by mouth 2 (two) times daily., Disp: , Rfl:  .  Travoprost, BAK Free, (TRAVATAN Z) 0.004 % SOLN ophthalmic solution, Place 1 drop into both eyes at bedtime., Disp: , Rfl:  .  traZODone (DESYREL) 100 MG tablet, Take 200 mg by  mouth at bedtime. , Disp: , Rfl:   EXAM:  Vitals:   09/19/16 1047  BP: 118/60  Pulse: 96  Temp: 98.2 F (36.8 C)    Body mass index is 32.19 kg/m.  GENERAL: vitals reviewed and listed above, alert, oriented, appears well hydrated and in no acute distress  HEENT: atraumatic, conjunttiva clear, no obvious abnormalities on inspection of external nose and ears  NECK: no obvious masses on inspection  LUNGS: clear to auscultation bilaterally, no wheezes, rales or rhonchi, good air movement  CV: HRRR, no peripheral edema  MS: moves all extremities without noticeable abnormality  PSYCH: pleasant and cooperative, no obvious depression or anxiety  ASSESSMENT AND PLAN:  Discussed the following assessment and plan:  Depression, recurrent (HCC)  -I certainly have seen mood change with prednisone and this should wear off over the next few weeks if related -did advise she still follow up with her psychiatrist as planned -she declined labs today but plans to do this at her follow up/CPE -Patient advised to return or notify a doctor immediately if symptoms worsen or persist or new concerns arise.  Patient Instructions  BEFORE YOU LEAVE: -follow up: AWV with Manuela Schwartz and follow up with Dr. Maudie Mercury in June or July  See the psychiatrist as planned tomorrow.  I hope you are feeling better soon! Seek care immediately if worsening, new concerns or you are not improving with treatment.      Colin Benton R., DO

## 2016-09-19 ENCOUNTER — Encounter: Payer: Self-pay | Admitting: Family Medicine

## 2016-09-19 ENCOUNTER — Ambulatory Visit (INDEPENDENT_AMBULATORY_CARE_PROVIDER_SITE_OTHER): Payer: Medicare Other | Admitting: Family Medicine

## 2016-09-19 VITALS — BP 118/60 | HR 96 | Temp 98.2°F | Ht 63.0 in | Wt 181.7 lb

## 2016-09-19 DIAGNOSIS — F339 Major depressive disorder, recurrent, unspecified: Secondary | ICD-10-CM | POA: Diagnosis not present

## 2016-09-19 NOTE — Patient Instructions (Signed)
BEFORE YOU LEAVE: -follow up: AWV with Manuela Schwartz and follow up with Dr. Maudie Mercury in June or July  See the psychiatrist as planned tomorrow.  I hope you are feeling better soon! Seek care immediately if worsening, new concerns or you are not improving with treatment.

## 2016-10-03 ENCOUNTER — Other Ambulatory Visit: Payer: Self-pay | Admitting: Family Medicine

## 2016-10-03 DIAGNOSIS — F331 Major depressive disorder, recurrent, moderate: Secondary | ICD-10-CM | POA: Diagnosis not present

## 2016-10-25 DIAGNOSIS — M1712 Unilateral primary osteoarthritis, left knee: Secondary | ICD-10-CM | POA: Diagnosis not present

## 2016-10-25 DIAGNOSIS — M19012 Primary osteoarthritis, left shoulder: Secondary | ICD-10-CM | POA: Diagnosis not present

## 2016-11-03 DIAGNOSIS — F3342 Major depressive disorder, recurrent, in full remission: Secondary | ICD-10-CM | POA: Diagnosis not present

## 2016-11-07 ENCOUNTER — Other Ambulatory Visit: Payer: Self-pay | Admitting: Family Medicine

## 2016-11-10 NOTE — Progress Notes (Deleted)
Subjective:   Stephanie Dickerson is a 83 y.o. female who presents for Medicare Annual (Subsequent) preventive examination.  Review of Systems:  No ROS.  Medicare Wellness Visit. Additional risk factors are reflected in the social history.    Sleep patterns: Home Safety/Smoke Alarms: Feels safe in home. Smoke alarms in place.  Living environment; residence and Firearm Safety: [place] Safety/Bike Helmet: Wears seat belt.   Counseling:   Dental-  Female:   Pap-      Aged out. Mammo-      11/20/2012 Dexa scan-       01/20/2012 CCS-    Objective:     Vitals: There were no vitals taken for this visit.  There is no height or weight on file to calculate BMI.   Tobacco History  Smoking Status  . Former Smoker  . Types: Cigarettes  Smokeless Tobacco  . Never Used    Comment: "quit smoking cigarettes around 40 years ago"     Counseling given: Not Answered   Past Medical History:  Diagnosis Date  . Anxiety/Insomnia - followed by Debbe Bales, psych nurse 02/02/2012  . Cancer ([place])    Breast cancer-Right  . Chicken pox   . Depression   . Glaucoma   . Hyperlipidemia   . Hypertension   . Osteoarthritis - followed by Dr. Lawana Chambers in Ortho 02/02/2012  . Osteopenia    Past Surgical History:  Procedure Laterality Date  . arthroscopy knee surgery     left  . BREAST SURGERY     Lumpectomy  . cataract surgery    . LASER PHOTO ABLATION Right 12/17/2013   Procedure: LASER PHOTO ABLATION;  Surgeon: Hayden Pedro, MD;  Location: [place];  Service: Ophthalmology;  Laterality: Right;  . PARS PLANA VITRECTOMY Right 12/17/2013   Procedure: PARS PLANA VITRECTOMY WITH 25G REMOVAL/SUTURE INTRAOCULAR LENS, ENDOLASER;  Surgeon: Hayden Pedro, MD;  Location: [place];  Service: Ophthalmology;  Laterality: Right;  . rotator cuff surgery  2011  . SPINE SURGERY    . TONSILLECTOMY AND ADENOIDECTOMY    . VITRECTOMY Right 12/17/2013   DR MATTHEWS   Family History  Problem Relation Age of  Onset  . Bone cancer Mother   . Leukemia Father   . Lung cancer Unknown        husband   History  Sexual Activity  . Sexual activity: Yes  . Birth control/ protection: Post-menopausal    Outpatient Encounter Prescriptions as of 11/17/2016  Medication Sig  . amLODipine (NORVASC) 10 MG tablet TAKE ONE TABLET BY MOUTH DAILY   -- NEEDS APPOINTMENT FOR PHYSICAL EXAM FOR REFILLS  . aspirin 81 MG tablet Take 81 mg by mouth daily.  Marland Kitchen atorvastatin (LIPITOR) 80 MG tablet TAKE 1/2 TABLETS (40 MG TOTAL) BY MOUTH DAILY.  . celecoxib (CELEBREX) 200 MG capsule Take 200 mg by mouth daily.   . citalopram (CELEXA) 20 MG tablet Take 20 mg by mouth at bedtime.   . docusate sodium (COLACE) 100 MG capsule Take 200 mg by mouth daily.  . fluticasone (FLONASE) 50 MCG/ACT nasal spray Place 2 sprays into both nostrils daily.  Marland Kitchen GLUCOSAMINE-CHONDROITIN PO Take 1 tablet by mouth every morning.   Marland Kitchen imipramine (TOFRANIL) 50 MG tablet Take 100 mg by mouth at bedtime.   Marland Kitchen LORazepam (ATIVAN) 1 MG tablet Take 1 mg by mouth at bedtime.   Marland Kitchen losartan (COZAAR) 100 MG tablet TAKE 1 TABLET (100 MG TOTAL) BY MOUTH DAILY.  . magnesium  citrate solution Take 296 mLs by mouth once.  . methocarbamol (ROBAXIN) 500 MG tablet Take 500 mg by mouth daily as needed for muscle spasms.   . Multiple Vitamin (MULTIVITAMIN) capsule Take 1 capsule by mouth daily.  . Multiple Vitamins-Minerals (PRESERVISION AREDS PO) Take 1 tablet by mouth 2 (two) times daily.  . Polyethylene Glycol 3350 (MIRALAX PO) Take by mouth.  . Travoprost, BAK Free, (TRAVATAN Z) 0.004 % SOLN ophthalmic solution Place 1 drop into both eyes at bedtime.  . traZODone (DESYREL) 100 MG tablet Take 200 mg by mouth at bedtime.    No facility-administered encounter medications on file as of 11/17/2016.     Activities of Daily Living No flowsheet data found.  Patient Care Team: Lucretia Kern, DO as PCP - General (Family Medicine) Leia Alf, MD (Inactive) as Referring  Physician (Internal Medicine) Calvert Cantor, MD (Ophthalmology) Eston Esters, MD (Inactive) (Hematology and Oncology) Noemi Chapel, NP as Nurse Practitioner Latanya Maudlin, MD as Consulting Physician (Orthopedic Surgery)    Assessment:    Physical assessment deferred to PCP.  Exercise Activities and Dietary recommendations   Diet (meal preparation, eat out, water intake, caffeinated beverages, dairy products, fruits and vegetables):  Breakfast: Lunch:  Dinner:      Goals    None     Fall Risk Fall Risk  10/19/2015 05/09/2014  Falls in the past year? No No   Depression Screen PHQ 2/9 Scores 10/19/2015 05/09/2014  PHQ - 2 Score 0 0     Cognitive Function        Immunization History  Administered Date(s) Administered  . Influenza Split 02/02/2012  . Influenza, High Dose Seasonal PF 02/05/2015, 03/31/2016  . Influenza,inj,Quad PF,36+ Mos 01/04/2013, 02/06/2014  . Pneumococcal Conjugate-13 02/05/2015  . Pneumococcal Polysaccharide-23 05/02/2010  . Tdap 05/02/2010  . Zoster 05/03/2011   Screening Tests Health Maintenance  Topic Date Due  . INFLUENZA VACCINE  11/30/2016  . TETANUS/TDAP  05/02/2020  . DEXA SCAN  Completed  . PNA vac Low Risk Adult  Completed      Plan:   ***   I have personally reviewed and noted the following in the patient's chart:   . Medical and social history . Use of alcohol, tobacco or illicit drugs  . Current medications and supplements . Functional ability and status . Nutritional status . Physical activity . Advanced directives . List of other physicians . Vitals . Screenings to include cognitive, depression, and falls . Referrals and appointments  In addition, I have reviewed and discussed with patient certain preventive protocols, quality metrics, and best practice recommendations. A written personalized care plan for preventive services as well as general preventive health recommendations were provided to patient.      Ree Edman, RN  11/10/2016

## 2016-11-10 NOTE — Progress Notes (Deleted)
Pre visit review using our clinic review tool, if applicable. No additional management support is needed unless otherwise documented below in the visit note. 

## 2016-11-17 ENCOUNTER — Encounter: Payer: Self-pay | Admitting: Family Medicine

## 2016-11-17 ENCOUNTER — Ambulatory Visit (INDEPENDENT_AMBULATORY_CARE_PROVIDER_SITE_OTHER): Payer: Medicare Other | Admitting: Family Medicine

## 2016-11-17 VITALS — BP 104/60 | HR 90 | Temp 98.0°F | Ht 63.0 in | Wt 185.5 lb

## 2016-11-17 DIAGNOSIS — I10 Essential (primary) hypertension: Secondary | ICD-10-CM

## 2016-11-17 DIAGNOSIS — F3342 Major depressive disorder, recurrent, in full remission: Secondary | ICD-10-CM

## 2016-11-17 DIAGNOSIS — E785 Hyperlipidemia, unspecified: Secondary | ICD-10-CM | POA: Diagnosis not present

## 2016-11-17 DIAGNOSIS — R739 Hyperglycemia, unspecified: Secondary | ICD-10-CM | POA: Diagnosis not present

## 2016-11-17 DIAGNOSIS — R944 Abnormal results of kidney function studies: Secondary | ICD-10-CM | POA: Diagnosis not present

## 2016-11-17 DIAGNOSIS — Z6832 Body mass index (BMI) 32.0-32.9, adult: Secondary | ICD-10-CM

## 2016-11-17 LAB — BASIC METABOLIC PANEL
BUN: 30 mg/dL — AB (ref 6–23)
CHLORIDE: 103 meq/L (ref 96–112)
CO2: 30 mEq/L (ref 19–32)
Calcium: 9.8 mg/dL (ref 8.4–10.5)
Creatinine, Ser: 1.36 mg/dL — ABNORMAL HIGH (ref 0.40–1.20)
GFR: 38.75 mL/min — AB (ref >60.00)
Glucose, Bld: 103 mg/dL — ABNORMAL HIGH (ref 70–99)
POTASSIUM: 5.2 meq/L — AB (ref 3.5–5.1)
Sodium: 139 mEq/L (ref 135–145)

## 2016-11-17 LAB — CBC
HCT: 36.2 % (ref 36.0–46.0)
Hemoglobin: 11.6 g/dL — ABNORMAL LOW (ref 12.0–15.0)
MCHC: 31.9 g/dL (ref 30.0–36.0)
MCV: 93 fl (ref 78.0–100.0)
Platelets: 281 10*3/uL (ref 150.0–400.0)
RBC: 3.9 Mil/uL (ref 3.87–5.11)
RDW: 15.3 % (ref 11.5–15.5)
WBC: 9.3 10*3/uL (ref 4.0–10.5)

## 2016-11-17 LAB — LIPID PANEL
CHOLESTEROL: 164 mg/dL (ref 0–200)
HDL: 80.4 mg/dL (ref >39.00)
LDL Cholesterol: 58 mg/dL (ref 0–99)
NONHDL: 83.13
Total CHOL/HDL Ratio: 2
Triglycerides: 128 mg/dL (ref 0.0–149.0)
VLDL: 25.6 mg/dL (ref 0.0–40.0)

## 2016-11-17 LAB — HEMOGLOBIN A1C: HEMOGLOBIN A1C: 6.1 % (ref 4.6–6.5)

## 2016-11-17 NOTE — Patient Instructions (Signed)
BEFORE YOU LEAVE: -follow up: AWV with Manuela Schwartz in 3 months; follow up with Dr. Maudie Mercury in 6 months -labs  We have ordered labs or studies at this visit. It can take up to 1-2 weeks for results and processing. IF results require follow up or explanation, we will call you with instructions. Clinically stable results will be released to your [facility]. If you have not heard from [place] or cannot find your results in [facility] in 2 weeks please contact our office at [phone].  If you are not yet signed up for [facility], please consider signing up.   We recommend the following healthy lifestyle for LIFE: 1) Small portions.   Tip: eat off of a salad plate instead of a dinner plate.  Tip: It is ok to feel hungry after a meal - that likely means you ate an appropriate portion.  Tip: if you need more or a snack choose fruits, veggies and/or a handful of nuts or seeds.  2) Eat a healthy clean diet.   TRY TO EAT: -at least 5-7 servings of low sugar vegetables per day (not corn, potatoes or bananas.) -berries are the best choice if you wish to eat fruit.   -lean meets (fish, chicken or [place] breasts) -vegan proteins for some meals - beans or tofu, whole grains, nuts and seeds -Replace bad fats with good fats - good fats include: fish, nuts and seeds, canola oil, olive oil -small amounts of low fat or non fat dairy -small amounts of100 % whole grains - check the lables  AVOID: -SUGAR, sweets, anything with added sugar, corn syrup or sweeteners -if you must have a sweetener, small amounts of stevia may be best -sweetened beverages -simple starches (rice, bread, potatoes, pasta, chips, etc - small amounts of 100% whole grains are ok) -red meat, pork, butter -fried foods, fast food, processed food, excessive dairy, eggs and coconut.  3)Get at least 150 minutes of sweaty aerobic exercise per week.  4)Reduce stress - consider counseling, meditation and relaxation to balance other aspects of your  life.

## 2016-11-17 NOTE — Progress Notes (Signed)
HPI:  Here for follow up. Has AWV today for preventive care. PMH Depression, Anxiety, HTN, HLD, Hyperglycemia, Obesity, Osteoarthritis, Breast Ca.  Sees psychiatrist for management anxiety and depression. Reports doing much better then at her last visit and mood has been great the last few months. No CP, SOB, DOE, swelling or palpitations. Reports "I feel great". Due for labs: CBC, BMP, Hgba1c, lipids  ROS: See pertinent positives and negatives per HPI.  Past Medical History:  Diagnosis Date  . Anxiety/Insomnia - followed by Debbe Bales, psych nurse 02/02/2012  . Cancer ([place])    Breast cancer-Right  . Chicken pox   . Depression   . Glaucoma   . Hyperlipidemia   . Hypertension   . Osteoarthritis - followed by Dr. Lawana Chambers in Ortho 02/02/2012  . Osteopenia     Past Surgical History:  Procedure Laterality Date  . arthroscopy knee surgery     left  . BREAST SURGERY     Lumpectomy  . cataract surgery    . LASER PHOTO ABLATION Right 12/17/2013   Procedure: LASER PHOTO ABLATION;  Surgeon: Hayden Pedro, MD;  Location: [place];  Service: Ophthalmology;  Laterality: Right;  . PARS PLANA VITRECTOMY Right 12/17/2013   Procedure: PARS PLANA VITRECTOMY WITH 25G REMOVAL/SUTURE INTRAOCULAR LENS, ENDOLASER;  Surgeon: Hayden Pedro, MD;  Location: [place];  Service: Ophthalmology;  Laterality: Right;  . rotator cuff surgery  2011  . SPINE SURGERY    . TONSILLECTOMY AND ADENOIDECTOMY    . VITRECTOMY Right 12/17/2013   DR MATTHEWS    Family History  Problem Relation Age of Onset  . Bone cancer Mother   . Leukemia Father   . Lung cancer Unknown        husband    Social History   Social History  . Marital status: Widowed    Spouse name: N/A  . Number of children: N/A  . Years of education: N/A   Social History Main Topics  . Smoking status: Former Smoker    Types: Cigarettes  . Smokeless tobacco: Never Used     Comment: "quit smoking cigarettes around 40 years ago"  . Alcohol  use Yes     Comment: per pt a glass of white wine before dinner   . Drug use: No  . Sexual activity: Yes    Birth control/ protection: Post-menopausal   Other Topics Concern  . None   Social History Narrative  . None     Current Outpatient Prescriptions:  .  amLODipine (NORVASC) 10 MG tablet, TAKE ONE TABLET BY MOUTH DAILY   -- NEEDS APPOINTMENT FOR PHYSICAL EXAM FOR REFILLS, Disp: 30 tablet, Rfl: 1 .  aspirin 81 MG tablet, Take 81 mg by mouth daily., Disp: , Rfl:  .  atorvastatin (LIPITOR) 80 MG tablet, TAKE 1/2 TABLETS (40 MG TOTAL) BY MOUTH DAILY., Disp: 45 tablet, Rfl: 1 .  celecoxib (CELEBREX) 200 MG capsule, Take 200 mg by mouth daily. , Disp: , Rfl:  .  citalopram (CELEXA) 40 MG tablet, Take 40 mg by mouth daily., Disp: , Rfl:  .  docusate sodium (COLACE) 100 MG capsule, Take 200 mg by mouth daily., Disp: , Rfl:  .  fluticasone (FLONASE) 50 MCG/ACT nasal spray, Place 2 sprays into both nostrils daily., Disp: 16 g, Rfl: 0 .  GLUCOSAMINE-CHONDROITIN PO, Take 1 tablet by mouth every morning. , Disp: , Rfl:  .  imipramine (TOFRANIL) 50 MG tablet, Take 100 mg by mouth at bedtime. , Disp: ,  Rfl:  .  LORazepam (ATIVAN) 1 MG tablet, Take 1 mg by mouth at bedtime. , Disp: , Rfl:  .  losartan (COZAAR) 100 MG tablet, TAKE 1 TABLET (100 MG TOTAL) BY MOUTH DAILY., Disp: 90 tablet, Rfl: 1 .  magnesium citrate solution, Take 296 mLs by mouth once., Disp: , Rfl:  .  methocarbamol (ROBAXIN) 500 MG tablet, Take 500 mg by mouth daily as needed for muscle spasms. , Disp: , Rfl:  .  Multiple Vitamin (MULTIVITAMIN) capsule, Take 1 capsule by mouth daily., Disp: , Rfl:  .  Multiple Vitamins-Minerals (PRESERVISION AREDS PO), Take 1 tablet by mouth 2 (two) times daily., Disp: , Rfl:  .  Polyethylene Glycol 3350 (MIRALAX PO), Take by mouth., Disp: , Rfl:  .  Travoprost, BAK Free, (TRAVATAN Z) 0.004 % SOLN ophthalmic solution, Place 1 drop into both eyes at bedtime., Disp: , Rfl:  .  traZODone (DESYREL)  100 MG tablet, Take 200 mg by mouth at bedtime. , Disp: , Rfl:   EXAM:  Vitals:   11/17/16 1331  BP: 104/60  Pulse: 90  Temp: 98 F (36.7 C)    Body mass index is 32.86 kg/m.  GENERAL: vitals reviewed and listed above, alert, oriented, appears well hydrated and in no acute distress  HEENT: atraumatic, conjunttiva clear, no obvious abnormalities on inspection of external nose and ears  NECK: no obvious masses on inspection  LUNGS: clear to auscultation bilaterally, no wheezes, rales or rhonchi, good air movement  CV: HRRR, no peripheral edema  MS: moves all extremities without noticeable abnormality  PSYCH: pleasant and cooperative, no obvious depression or anxiety  ASSESSMENT AND PLAN:  Discussed the following assessment and plan:  Essential hypertension - Plan: CBC, Basic metabolic panel  Hyperlipidemia, unspecified hyperlipidemia type - Plan: Lipid panel  Hyperglycemia - Plan: Hemoglobin A1c  BMI 32.0-32.9,adult  Recurrent major depressive disorder, in full remission ([place])  -lifestyle recs per handout -labs today -continue current medication for now -was supposed to have AWV today - but she did not know and could not stay for this - advisedAWV with susan in 3 months, f/u with Dr. Maudie Mercury in 6 months -Patient advised to return or notify a doctor immediately if symptoms worsen or persist or new concerns arise.  Patient Instructions  BEFORE YOU LEAVE: -follow up: AWV with Manuela Schwartz in 3 months; follow up with Dr. Maudie Mercury in 6 months -labs  We have ordered labs or studies at this visit. It can take up to 1-2 weeks for results and processing. IF results require follow up or explanation, we will call you with instructions. Clinically stable results will be released to your [facility]. If you have not heard from [place] or cannot find your results in [facility] in 2 weeks please contact our office at [phone].  If you are not yet signed up for [facility], please consider signing  up.   We recommend the following healthy lifestyle for LIFE: 1) Small portions.   Tip: eat off of a salad plate instead of a dinner plate.  Tip: It is ok to feel hungry after a meal - that likely means you ate an appropriate portion.  Tip: if you need more or a snack choose fruits, veggies and/or a handful of nuts or seeds.  2) Eat a healthy clean diet.   TRY TO EAT: -at least 5-7 servings of low sugar vegetables per day (not corn, potatoes or bananas.) -berries are the best choice if you wish to eat fruit.   -  lean meets (fish, chicken or [place] breasts) -vegan proteins for some meals - beans or tofu, whole grains, nuts and seeds -Replace bad fats with good fats - good fats include: fish, nuts and seeds, canola oil, olive oil -small amounts of low fat or non fat dairy -small amounts of100 % whole grains - check the lables  AVOID: -SUGAR, sweets, anything with added sugar, corn syrup or sweeteners -if you must have a sweetener, small amounts of stevia may be best -sweetened beverages -simple starches (rice, bread, potatoes, pasta, chips, etc - small amounts of 100% whole grains are ok) -red meat, pork, butter -fried foods, fast food, processed food, excessive dairy, eggs and coconut.  3)Get at least 150 minutes of sweaty aerobic exercise per week.  4)Reduce stress - consider counseling, meditation and relaxation to balance other aspects of your life.          Colin Benton R., DO

## 2016-11-21 ENCOUNTER — Encounter: Payer: Self-pay | Admitting: Family Medicine

## 2016-11-21 NOTE — Addendum Note (Signed)
Addended by: Agnes Lawrence on: 11/21/2016 05:24 PM   Modules accepted: Orders

## 2016-12-13 ENCOUNTER — Ambulatory Visit: Payer: Medicare Other | Admitting: Family Medicine

## 2016-12-19 DIAGNOSIS — M1712 Unilateral primary osteoarthritis, left knee: Secondary | ICD-10-CM | POA: Diagnosis not present

## 2016-12-19 DIAGNOSIS — M19012 Primary osteoarthritis, left shoulder: Secondary | ICD-10-CM | POA: Diagnosis not present

## 2017-01-03 ENCOUNTER — Other Ambulatory Visit: Payer: Self-pay | Admitting: Family Medicine

## 2017-01-04 ENCOUNTER — Other Ambulatory Visit: Payer: Self-pay | Admitting: Family Medicine

## 2017-01-19 ENCOUNTER — Encounter: Payer: Self-pay | Admitting: Family Medicine

## 2017-02-09 ENCOUNTER — Telehealth: Payer: Self-pay

## 2017-02-09 NOTE — Telephone Encounter (Signed)
Call to Stephanie Dickerson to reschedule her AWV. Can completed this on Friday at 12noon or will reschedule. Did not leave VM ; no answer

## 2017-02-10 NOTE — Telephone Encounter (Signed)
Call to Stephanie Dickerson and left a VM to reschedule this apt  Will see at 12 noon or can reschedule for another time

## 2017-02-15 DIAGNOSIS — H401132 Primary open-angle glaucoma, bilateral, moderate stage: Secondary | ICD-10-CM | POA: Diagnosis not present

## 2017-02-17 ENCOUNTER — Ambulatory Visit: Payer: Medicare Other

## 2017-02-20 DIAGNOSIS — M1712 Unilateral primary osteoarthritis, left knee: Secondary | ICD-10-CM | POA: Diagnosis not present

## 2017-02-20 DIAGNOSIS — M19012 Primary osteoarthritis, left shoulder: Secondary | ICD-10-CM | POA: Diagnosis not present

## 2017-02-24 ENCOUNTER — Ambulatory Visit: Payer: Medicare Other

## 2017-03-02 ENCOUNTER — Ambulatory Visit (INDEPENDENT_AMBULATORY_CARE_PROVIDER_SITE_OTHER): Payer: Medicare Other | Admitting: *Deleted

## 2017-03-02 DIAGNOSIS — Z23 Encounter for immunization: Secondary | ICD-10-CM

## 2017-03-20 DIAGNOSIS — S32612K Displaced avulsion fracture of left ischium, subsequent encounter for fracture with nonunion: Secondary | ICD-10-CM | POA: Diagnosis not present

## 2017-03-22 DIAGNOSIS — M25552 Pain in left hip: Secondary | ICD-10-CM | POA: Diagnosis not present

## 2017-03-22 DIAGNOSIS — S32613K Displaced avulsion fracture of unspecified ischium, subsequent encounter for fracture with nonunion: Secondary | ICD-10-CM | POA: Diagnosis not present

## 2017-03-22 DIAGNOSIS — M1712 Unilateral primary osteoarthritis, left knee: Secondary | ICD-10-CM | POA: Diagnosis not present

## 2017-03-29 DIAGNOSIS — M25552 Pain in left hip: Secondary | ICD-10-CM | POA: Diagnosis not present

## 2017-03-29 DIAGNOSIS — S32613K Displaced avulsion fracture of unspecified ischium, subsequent encounter for fracture with nonunion: Secondary | ICD-10-CM | POA: Diagnosis not present

## 2017-03-30 ENCOUNTER — Other Ambulatory Visit: Payer: Self-pay | Admitting: Family Medicine

## 2017-04-03 DIAGNOSIS — M19012 Primary osteoarthritis, left shoulder: Secondary | ICD-10-CM | POA: Diagnosis not present

## 2017-04-03 DIAGNOSIS — M1712 Unilateral primary osteoarthritis, left knee: Secondary | ICD-10-CM | POA: Diagnosis not present

## 2017-04-06 ENCOUNTER — Other Ambulatory Visit: Payer: Self-pay

## 2017-04-27 DIAGNOSIS — M25552 Pain in left hip: Secondary | ICD-10-CM | POA: Diagnosis not present

## 2017-04-27 DIAGNOSIS — S32612K Displaced avulsion fracture of left ischium, subsequent encounter for fracture with nonunion: Secondary | ICD-10-CM | POA: Diagnosis not present

## 2017-05-11 ENCOUNTER — Encounter: Payer: Self-pay | Admitting: Family Medicine

## 2017-05-18 ENCOUNTER — Ambulatory Visit: Payer: Medicare Other | Admitting: Family Medicine

## 2017-05-23 NOTE — Progress Notes (Signed)
HPI:  Stephanie Dickerson is a pleasant 84 year old here for follow-up.  She has a past medical history of depression and anxiety that she sees psychiatry for, hypertension, hyperlipidemia, hyperglycemia, obesity, osteoarthritis and breast cancer. Today she reports several concerns.  She is here with her son.  She has a chronic intermittent postnasal drip with globus sensation at times.  This is been worse the last week.  Denies fevers, cough, breath, wheezing, body aches nasal congestion is clear.  She has a history of allergic rhinitis.  Denies any acid reflux. She sees an orthopedic specialist.  She has diffuse osteoarthritis and recently suffered an avulsion fracture of the ischial tuberosity.  She has tried a number of things for pain, even opioids that her orthopedic office applied.  The only thing that works is Celebrex.  She has been taking this once daily for a very long time.  And her son are aware of risks, however they report this is the only thing.  They would like to recheck her kidney function, but if not significantly worsening they would like to continue this.  She has a history of constipation.  We had recommended a fiber supplement and as needed MiraLAX in the past.  Seems like he thought that she was to continue the MiraLAX every day.  When she takes it every day it works to good.  Takes it 1-2 times per week it works great.   Her mood has been a little worse because of her orthopedic issues lately.  Follow-up with her psychiatrist for this. No chest pain, shortness of breath or increased swelling.  Due for labs, due for annual wellness visit  ROS: See pertinent positives and negatives per HPI.  Past Medical History:  Diagnosis Date  . Anxiety/Insomnia - followed by Debbe Bales, psych nurse 02/02/2012  . Cancer ([place])    Breast cancer-Right  . Chicken pox   . Depression   . Glaucoma   . Hyperlipidemia   . Hypertension   . Osteoarthritis - followed by Dr. Lawana Chambers in Ortho  02/02/2012  . Osteopenia     Past Surgical History:  Procedure Laterality Date  . arthroscopy knee surgery     left  . BREAST SURGERY     Lumpectomy  . cataract surgery    . LASER PHOTO ABLATION Right 12/17/2013   Procedure: LASER PHOTO ABLATION;  Surgeon: Hayden Pedro, MD;  Location: [facility];  Service: Ophthalmology;  Laterality: Right;  . PARS PLANA VITRECTOMY Right 12/17/2013   Procedure: PARS PLANA VITRECTOMY WITH 25G REMOVAL/SUTURE INTRAOCULAR LENS, ENDOLASER;  Surgeon: Hayden Pedro, MD;  Location: [facility] [facility];  Service: Ophthalmology;  Laterality: Right;  . rotator cuff surgery  2011  . SPINE SURGERY    . TONSILLECTOMY AND ADENOIDECTOMY    . VITRECTOMY Right 12/17/2013   DR MATTHEWS    Family History  Problem Relation Age of Onset  . Bone cancer Mother   . Leukemia Father   . Lung cancer Unknown        husband    Social History   Socioeconomic History  . Marital status: Widowed    Spouse name: None  . Number of children: None  . Years of education: None  . Highest education level: None  Social Needs  . Financial resource strain: None  . Food insecurity - worry: None  . Food insecurity - inability: None  . Transportation needs - medical: None  . Transportation needs - non-medical: None  Occupational History  .  None  Tobacco Use  . Smoking status: Former Smoker    Types: Cigarettes  . Smokeless tobacco: Never Used  . Tobacco comment: "quit smoking cigarettes around 40 years ago"  Substance and Sexual Activity  . Alcohol use: Yes    Comment: per pt a glass of white wine before dinner   . Drug use: No  . Sexual activity: Yes    Birth control/protection: Post-menopausal  Other Topics Concern  . None  Social History Narrative  . None     Current Outpatient Medications:  .  amLODipine (NORVASC) 10 MG tablet, TAKE ONE TABLET BY MOUTH DAILY   -- NEEDS APPOINTMENT FOR PHYSICAL EXAM FOR REFILLS, Disp: 90 tablet, Rfl: 1 .  aspirin 81 MG tablet, Take 81 mg by  mouth daily., Disp: , Rfl:  .  atorvastatin (LIPITOR) 80 MG tablet, TAKE 1/2 TABLETS (40 MG TOTAL) BY MOUTH DAILY., Disp: 45 tablet, Rfl: 1 .  celecoxib (CELEBREX) 200 MG capsule, Take 200 mg by mouth daily. , Disp: , Rfl:  .  citalopram (CELEXA) 40 MG tablet, Take 40 mg by mouth daily., Disp: , Rfl:  .  GLUCOSAMINE-CHONDROITIN PO, Take 1 tablet by mouth every morning. , Disp: , Rfl:  .  imipramine (TOFRANIL) 50 MG tablet, Take 100 mg by mouth at bedtime. , Disp: , Rfl:  .  LORazepam (ATIVAN) 1 MG tablet, Take 1 mg by mouth at bedtime. , Disp: , Rfl:  .  losartan (COZAAR) 100 MG tablet, TAKE 1 TABLET (100 MG TOTAL) BY MOUTH DAILY., Disp: 90 tablet, Rfl: 1 .  magnesium citrate solution, Take 296 mLs by mouth once., Disp: , Rfl:  .  methocarbamol (ROBAXIN) 500 MG tablet, Take 500 mg by mouth daily as needed for muscle spasms. , Disp: , Rfl:  .  Polyethylene Glycol 3350 (MIRALAX PO), Take by mouth., Disp: , Rfl:  .  Travoprost, BAK Free, (TRAVATAN Z) 0.004 % SOLN ophthalmic solution, Place 1 drop into both eyes at bedtime., Disp: , Rfl:  .  traZODone (DESYREL) 100 MG tablet, Take 200 mg by mouth at bedtime. , Disp: , Rfl:   EXAM:  Vitals:   05/25/17 1253  BP: (!) 100/52  Pulse: 87  Temp: 98.4 F (36.9 C)    Body mass index is 32.86 kg/m.  GENERAL: vitals reviewed and listed above, alert, oriented, appears well hydrated and in no acute distress  HEENT: atraumatic, conjunttiva clear, no obvious abnormalities on inspection of external nose and ears, normal appearance of ear canals and TMs, clear nasal congestion, boggy pale turbinates, mild post oropharyngeal erythema with PND, no tonsillar edema or exudate, no sinus TTP  NECK: no obvious masses on inspection  LUNGS: clear to auscultation bilaterally, no wheezes, rales or rhonchi, good air movement  CV: HRRR, no peripheral edema  MS: In wheelchair  PSYCH: pleasant and cooperative, no obvious depression or anxiety  ASSESSMENT AND  PLAN:  Discussed the following assessment and plan:  Essential hypertension - Plan: Basic metabolic panel, CBC  Globus sensation  Hyperglycemia - Plan: Hemoglobin A1c  Hyperlipidemia, unspecified hyperlipidemia type  Constipation, unspecified constipation type  -We will check some labs today, discussed risks benefits of various different NSAIDs as these seem to work best for her arthritic pain.  They are well aware of the cardiovascular, renal and GI risks but would prefer to continue is nothing else works for her pain. -Suggested physical therapy and they plan to follow-up with orthopedic doctor about this. -We may want to  decrease some of her blood pressure medications depending on her labs. -Discussed daily use of a fiber supplement, and as needed use of the MiraLAX for her constipation. -Discussed common and likely causes of the globus sensation and postnasal drip.  Restart Flonase. -Advised follow-up in 3-4 months, sooner if needed. -Patient advised to return or notify a doctor immediately if symptoms worsen or persist or new concerns arise.  Patient Instructions  BEFORE YOU LEAVE: -labs -follow up: AWV with Manuela Schwartz and follow up with Dr. Maudie Mercury in 3-4 months  We have ordered labs or studies at this visit. It can take up to 1-2 weeks for results and processing. IF results require follow up or explanation, we will call you with instructions. Clinically stable results will be released to your [facility]. If you have not heard from [place] or cannot find your results in [facility] in 2 weeks please contact our office at [phone].  If you are not yet signed up for [facility], please consider signing up.  Fiber supplement (such as benefiber and metamucil) daily in water before breakfast. Mirilax once daily for 3-5 days as needed if stopped up.  Restart flonase. 2 sprays each nostril daily for 1 month, then one spray each nostril daily after that. Please follow up if the throat issues  persist.             Lucretia Kern, DO

## 2017-05-25 ENCOUNTER — Ambulatory Visit (INDEPENDENT_AMBULATORY_CARE_PROVIDER_SITE_OTHER): Payer: Medicare Other | Admitting: Family Medicine

## 2017-05-25 ENCOUNTER — Encounter: Payer: Self-pay | Admitting: Family Medicine

## 2017-05-25 VITALS — BP 100/52 | HR 87 | Temp 98.4°F | Ht 63.0 in

## 2017-05-25 DIAGNOSIS — K5900 Constipation, unspecified: Secondary | ICD-10-CM

## 2017-05-25 DIAGNOSIS — R739 Hyperglycemia, unspecified: Secondary | ICD-10-CM | POA: Diagnosis not present

## 2017-05-25 DIAGNOSIS — R0989 Other specified symptoms and signs involving the circulatory and respiratory systems: Secondary | ICD-10-CM

## 2017-05-25 DIAGNOSIS — F458 Other somatoform disorders: Secondary | ICD-10-CM | POA: Diagnosis not present

## 2017-05-25 DIAGNOSIS — E785 Hyperlipidemia, unspecified: Secondary | ICD-10-CM | POA: Diagnosis not present

## 2017-05-25 DIAGNOSIS — I10 Essential (primary) hypertension: Secondary | ICD-10-CM | POA: Diagnosis not present

## 2017-05-25 LAB — BASIC METABOLIC PANEL
BUN: 33 mg/dL — ABNORMAL HIGH (ref 6–23)
CO2: 25 mEq/L (ref 19–32)
Calcium: 9 mg/dL (ref 8.4–10.5)
Chloride: 106 mEq/L (ref 96–112)
Creatinine, Ser: 1.4 mg/dL — ABNORMAL HIGH (ref 0.40–1.20)
GFR: 37.43 mL/min — AB (ref >60.00)
GLUCOSE: 116 mg/dL — AB (ref 70–99)
Potassium: 5 mEq/L (ref 3.5–5.1)
SODIUM: 140 meq/L (ref 135–145)

## 2017-05-25 LAB — CBC
HCT: 33.4 % — ABNORMAL LOW (ref 36.0–46.0)
HEMOGLOBIN: 10.6 g/dL — AB (ref 12.0–15.0)
MCHC: 31.7 g/dL (ref 30.0–36.0)
MCV: 93.5 fl (ref 78.0–100.0)
PLATELETS: 238 10*3/uL (ref 150.0–400.0)
RBC: 3.57 Mil/uL — AB (ref 3.87–5.11)
RDW: 16.3 % — ABNORMAL HIGH (ref 11.5–15.5)
WBC: 12.9 10*3/uL — ABNORMAL HIGH (ref 4.0–10.5)

## 2017-05-25 LAB — HEMOGLOBIN A1C: Hgb A1c MFr Bld: 6.2 % (ref 4.6–6.5)

## 2017-05-25 NOTE — Patient Instructions (Signed)
BEFORE YOU LEAVE: -labs -follow up: AWV with Manuela Schwartz and follow up with Dr. Maudie Mercury in 3-4 months  We have ordered labs or studies at this visit. It can take up to 1-2 weeks for results and processing. IF results require follow up or explanation, we will call you with instructions. Clinically stable results will be released to your [facility]. If you have not heard from [place] or cannot find your results in [facility] in 2 weeks please contact our office at [phone].  If you are not yet signed up for [facility], please consider signing up.  Fiber supplement (such as benefiber and metamucil) daily in water before breakfast. Mirilax once daily for 3-5 days as needed if stopped up.  Restart flonase. 2 sprays each nostril daily for 1 month, then one spray each nostril daily after that. Please follow up if the throat issues persist.

## 2017-05-28 ENCOUNTER — Encounter: Payer: Self-pay | Admitting: Family Medicine

## 2017-05-30 ENCOUNTER — Encounter: Payer: Self-pay | Admitting: Family Medicine

## 2017-06-01 ENCOUNTER — Encounter: Payer: Self-pay | Admitting: Family Medicine

## 2017-06-01 NOTE — Telephone Encounter (Signed)
I spoke with Dr. Maudie Mercury and should would recommend ER for evaluation. Dr. Maudie Mercury can see pt but it would be next week. I did try to reach pt or son and no answer or option to leave a message.

## 2017-06-02 ENCOUNTER — Inpatient Hospital Stay (HOSPITAL_COMMUNITY): Payer: Medicare Other

## 2017-06-02 ENCOUNTER — Encounter (HOSPITAL_COMMUNITY): Payer: Self-pay

## 2017-06-02 ENCOUNTER — Encounter: Payer: Self-pay | Admitting: Family Medicine

## 2017-06-02 ENCOUNTER — Inpatient Hospital Stay (HOSPITAL_COMMUNITY)
Admission: EM | Admit: 2017-06-02 | Discharge: 2017-06-30 | DRG: 180 | Disposition: E | Payer: Medicare Other | Attending: Internal Medicine | Admitting: Internal Medicine

## 2017-06-02 ENCOUNTER — Other Ambulatory Visit: Payer: Self-pay

## 2017-06-02 ENCOUNTER — Emergency Department (HOSPITAL_COMMUNITY): Payer: Medicare Other

## 2017-06-02 DIAGNOSIS — Z853 Personal history of malignant neoplasm of breast: Secondary | ICD-10-CM

## 2017-06-02 DIAGNOSIS — Z4682 Encounter for fitting and adjustment of non-vascular catheter: Secondary | ICD-10-CM | POA: Diagnosis not present

## 2017-06-02 DIAGNOSIS — J189 Pneumonia, unspecified organism: Secondary | ICD-10-CM

## 2017-06-02 DIAGNOSIS — J90 Pleural effusion, not elsewhere classified: Secondary | ICD-10-CM

## 2017-06-02 DIAGNOSIS — Z66 Do not resuscitate: Secondary | ICD-10-CM | POA: Diagnosis present

## 2017-06-02 DIAGNOSIS — R932 Abnormal findings on diagnostic imaging of liver and biliary tract: Secondary | ICD-10-CM

## 2017-06-02 DIAGNOSIS — J95811 Postprocedural pneumothorax: Secondary | ICD-10-CM | POA: Diagnosis not present

## 2017-06-02 DIAGNOSIS — N289 Disorder of kidney and ureter, unspecified: Secondary | ICD-10-CM | POA: Diagnosis not present

## 2017-06-02 DIAGNOSIS — J910 Malignant pleural effusion: Secondary | ICD-10-CM

## 2017-06-02 DIAGNOSIS — I639 Cerebral infarction, unspecified: Secondary | ICD-10-CM

## 2017-06-02 DIAGNOSIS — Z515 Encounter for palliative care: Secondary | ICD-10-CM | POA: Diagnosis not present

## 2017-06-02 DIAGNOSIS — J9600 Acute respiratory failure, unspecified whether with hypoxia or hypercapnia: Secondary | ICD-10-CM

## 2017-06-02 DIAGNOSIS — S76811S Strain of other specified muscles, fascia and tendons at thigh level, right thigh, sequela: Secondary | ICD-10-CM | POA: Diagnosis not present

## 2017-06-02 DIAGNOSIS — J069 Acute upper respiratory infection, unspecified: Secondary | ICD-10-CM | POA: Diagnosis not present

## 2017-06-02 DIAGNOSIS — I63443 Cerebral infarction due to embolism of bilateral cerebellar arteries: Secondary | ICD-10-CM | POA: Diagnosis not present

## 2017-06-02 DIAGNOSIS — I4510 Unspecified right bundle-branch block: Secondary | ICD-10-CM | POA: Diagnosis present

## 2017-06-02 DIAGNOSIS — Z79899 Other long term (current) drug therapy: Secondary | ICD-10-CM | POA: Diagnosis not present

## 2017-06-02 DIAGNOSIS — Y92239 Unspecified place in hospital as the place of occurrence of the external cause: Secondary | ICD-10-CM | POA: Diagnosis not present

## 2017-06-02 DIAGNOSIS — E785 Hyperlipidemia, unspecified: Secondary | ICD-10-CM | POA: Diagnosis not present

## 2017-06-02 DIAGNOSIS — R401 Stupor: Secondary | ICD-10-CM | POA: Diagnosis not present

## 2017-06-02 DIAGNOSIS — M1990 Unspecified osteoarthritis, unspecified site: Secondary | ICD-10-CM | POA: Diagnosis present

## 2017-06-02 DIAGNOSIS — J9601 Acute respiratory failure with hypoxia: Secondary | ICD-10-CM | POA: Diagnosis not present

## 2017-06-02 DIAGNOSIS — I214 Non-ST elevation (NSTEMI) myocardial infarction: Secondary | ICD-10-CM

## 2017-06-02 DIAGNOSIS — E041 Nontoxic single thyroid nodule: Secondary | ICD-10-CM | POA: Diagnosis not present

## 2017-06-02 DIAGNOSIS — M8580 Other specified disorders of bone density and structure, unspecified site: Secondary | ICD-10-CM | POA: Diagnosis present

## 2017-06-02 DIAGNOSIS — I10 Essential (primary) hypertension: Secondary | ICD-10-CM | POA: Diagnosis present

## 2017-06-02 DIAGNOSIS — Z7982 Long term (current) use of aspirin: Secondary | ICD-10-CM

## 2017-06-02 DIAGNOSIS — Y848 Other medical procedures as the cause of abnormal reaction of the patient, or of later complication, without mention of misadventure at the time of the procedure: Secondary | ICD-10-CM | POA: Diagnosis not present

## 2017-06-02 DIAGNOSIS — J939 Pneumothorax, unspecified: Secondary | ICD-10-CM | POA: Diagnosis not present

## 2017-06-02 DIAGNOSIS — I491 Atrial premature depolarization: Secondary | ICD-10-CM | POA: Diagnosis present

## 2017-06-02 DIAGNOSIS — G4700 Insomnia, unspecified: Secondary | ICD-10-CM | POA: Diagnosis not present

## 2017-06-02 DIAGNOSIS — I249 Acute ischemic heart disease, unspecified: Secondary | ICD-10-CM | POA: Diagnosis not present

## 2017-06-02 DIAGNOSIS — G9340 Encephalopathy, unspecified: Secondary | ICD-10-CM | POA: Diagnosis not present

## 2017-06-02 DIAGNOSIS — R160 Hepatomegaly, not elsewhere classified: Secondary | ICD-10-CM

## 2017-06-02 DIAGNOSIS — E739 Lactose intolerance, unspecified: Secondary | ICD-10-CM | POA: Diagnosis present

## 2017-06-02 DIAGNOSIS — E669 Obesity, unspecified: Secondary | ICD-10-CM | POA: Diagnosis present

## 2017-06-02 DIAGNOSIS — H409 Unspecified glaucoma: Secondary | ICD-10-CM | POA: Diagnosis present

## 2017-06-02 DIAGNOSIS — Z801 Family history of malignant neoplasm of trachea, bronchus and lung: Secondary | ICD-10-CM | POA: Diagnosis not present

## 2017-06-02 DIAGNOSIS — Z806 Family history of leukemia: Secondary | ICD-10-CM | POA: Diagnosis not present

## 2017-06-02 DIAGNOSIS — C787 Secondary malignant neoplasm of liver and intrahepatic bile duct: Secondary | ICD-10-CM | POA: Diagnosis present

## 2017-06-02 DIAGNOSIS — J9811 Atelectasis: Secondary | ICD-10-CM | POA: Diagnosis present

## 2017-06-02 DIAGNOSIS — R0602 Shortness of breath: Secondary | ICD-10-CM | POA: Diagnosis not present

## 2017-06-02 DIAGNOSIS — F419 Anxiety disorder, unspecified: Secondary | ICD-10-CM | POA: Diagnosis not present

## 2017-06-02 DIAGNOSIS — C50911 Malignant neoplasm of unspecified site of right female breast: Secondary | ICD-10-CM | POA: Diagnosis not present

## 2017-06-02 DIAGNOSIS — Z7189 Other specified counseling: Secondary | ICD-10-CM | POA: Diagnosis not present

## 2017-06-02 DIAGNOSIS — Z6831 Body mass index (BMI) 31.0-31.9, adult: Secondary | ICD-10-CM

## 2017-06-02 DIAGNOSIS — I6389 Other cerebral infarction: Secondary | ICD-10-CM | POA: Diagnosis not present

## 2017-06-02 DIAGNOSIS — Z87891 Personal history of nicotine dependence: Secondary | ICD-10-CM | POA: Diagnosis not present

## 2017-06-02 DIAGNOSIS — F329 Major depressive disorder, single episode, unspecified: Secondary | ICD-10-CM | POA: Diagnosis not present

## 2017-06-02 DIAGNOSIS — C384 Malignant neoplasm of pleura: Secondary | ICD-10-CM | POA: Diagnosis not present

## 2017-06-02 DIAGNOSIS — C3411 Malignant neoplasm of upper lobe, right bronchus or lung: Secondary | ICD-10-CM | POA: Diagnosis not present

## 2017-06-02 DIAGNOSIS — K7689 Other specified diseases of liver: Secondary | ICD-10-CM | POA: Diagnosis not present

## 2017-06-02 DIAGNOSIS — C7800 Secondary malignant neoplasm of unspecified lung: Secondary | ICD-10-CM | POA: Diagnosis not present

## 2017-06-02 DIAGNOSIS — J181 Lobar pneumonia, unspecified organism: Secondary | ICD-10-CM | POA: Diagnosis not present

## 2017-06-02 DIAGNOSIS — Z452 Encounter for adjustment and management of vascular access device: Secondary | ICD-10-CM | POA: Diagnosis not present

## 2017-06-02 LAB — I-STAT TROPONIN, ED: Troponin i, poc: 0.95 ng/mL (ref 0.00–0.08)

## 2017-06-02 LAB — BASIC METABOLIC PANEL
Anion gap: 14 (ref 5–15)
BUN: 38 mg/dL — ABNORMAL HIGH (ref 6–20)
CHLORIDE: 108 mmol/L (ref 101–111)
CO2: 20 mmol/L — AB (ref 22–32)
Calcium: 8.9 mg/dL (ref 8.9–10.3)
Creatinine, Ser: 1.64 mg/dL — ABNORMAL HIGH (ref 0.44–1.00)
GFR calc Af Amer: 30 mL/min — ABNORMAL LOW (ref >60)
GFR, EST NON AFRICAN AMERICAN: 26 mL/min — AB (ref >60)
GLUCOSE: 146 mg/dL — AB (ref 65–99)
POTASSIUM: 4.5 mmol/L (ref 3.5–5.1)
Sodium: 142 mmol/L (ref 135–145)

## 2017-06-02 LAB — I-STAT CG4 LACTIC ACID, ED: LACTIC ACID, VENOUS: 1.38 mmol/L (ref 0.5–1.9)

## 2017-06-02 LAB — CBC
HEMATOCRIT: 32.4 % — AB (ref 36.0–46.0)
HEMOGLOBIN: 10.2 g/dL — AB (ref 12.0–15.0)
MCH: 29.3 pg (ref 26.0–34.0)
MCHC: 31.5 g/dL (ref 30.0–36.0)
MCV: 93.1 fL (ref 78.0–100.0)
Platelets: 247 10*3/uL (ref 150–400)
RBC: 3.48 MIL/uL — ABNORMAL LOW (ref 3.87–5.11)
RDW: 15.9 % — ABNORMAL HIGH (ref 11.5–15.5)
WBC: 21.3 10*3/uL — ABNORMAL HIGH (ref 4.0–10.5)

## 2017-06-02 LAB — ECHOCARDIOGRAM COMPLETE
Height: 63 in
Weight: 2800 oz

## 2017-06-02 LAB — HEPARIN LEVEL (UNFRACTIONATED): HEPARIN UNFRACTIONATED: 0.14 [IU]/mL — AB (ref 0.30–0.70)

## 2017-06-02 LAB — INFLUENZA PANEL BY PCR (TYPE A & B)
INFLBPCR: NEGATIVE
Influenza A By PCR: NEGATIVE

## 2017-06-02 LAB — TROPONIN I
TROPONIN I: 1.38 ng/mL — AB (ref <0.03)
TROPONIN I: 1.57 ng/mL — AB (ref <0.03)
Troponin I: 1.56 ng/mL (ref <0.03)

## 2017-06-02 MED ORDER — ASPIRIN EC 81 MG PO TBEC
81.0000 mg | DELAYED_RELEASE_TABLET | Freq: Every day | ORAL | Status: DC
  Administered 2017-06-03: 81 mg via ORAL
  Filled 2017-06-02: qty 1

## 2017-06-02 MED ORDER — LATANOPROST 0.005 % OP SOLN
1.0000 [drp] | Freq: Every day | OPHTHALMIC | Status: DC
  Administered 2017-06-02 – 2017-06-11 (×10): 1 [drp] via OPHTHALMIC
  Filled 2017-06-02: qty 2.5

## 2017-06-02 MED ORDER — ACETAMINOPHEN 325 MG PO TABS
650.0000 mg | ORAL_TABLET | ORAL | Status: DC | PRN
  Administered 2017-06-05 – 2017-06-07 (×5): 650 mg via ORAL
  Filled 2017-06-02 (×5): qty 2

## 2017-06-02 MED ORDER — NITROGLYCERIN 0.4 MG SL SUBL
0.4000 mg | SUBLINGUAL_TABLET | SUBLINGUAL | Status: DC | PRN
Start: 2017-06-02 — End: 2017-06-08

## 2017-06-02 MED ORDER — IMIPRAMINE HCL 50 MG PO TABS
100.0000 mg | ORAL_TABLET | Freq: Every day | ORAL | Status: DC
  Administered 2017-06-02 – 2017-06-06 (×5): 100 mg via ORAL
  Filled 2017-06-02 (×8): qty 2

## 2017-06-02 MED ORDER — LOSARTAN POTASSIUM 50 MG PO TABS
100.0000 mg | ORAL_TABLET | Freq: Every day | ORAL | Status: DC
  Administered 2017-06-03 – 2017-06-06 (×4): 100 mg via ORAL
  Filled 2017-06-02 (×5): qty 2

## 2017-06-02 MED ORDER — TRAZODONE HCL 100 MG PO TABS
200.0000 mg | ORAL_TABLET | Freq: Every day | ORAL | Status: DC
  Administered 2017-06-02 – 2017-06-06 (×5): 200 mg via ORAL
  Filled 2017-06-02 (×5): qty 2

## 2017-06-02 MED ORDER — SODIUM CHLORIDE 0.9 % IV SOLN
INTRAVENOUS | Status: AC
  Administered 2017-06-02: 11:00:00 via INTRAVENOUS

## 2017-06-02 MED ORDER — CITALOPRAM HYDROBROMIDE 20 MG PO TABS
40.0000 mg | ORAL_TABLET | Freq: Every day | ORAL | Status: DC
  Administered 2017-06-02 – 2017-06-07 (×6): 40 mg via ORAL
  Filled 2017-06-02 (×4): qty 2
  Filled 2017-06-02: qty 4
  Filled 2017-06-02: qty 2

## 2017-06-02 MED ORDER — AZITHROMYCIN 500 MG IV SOLR
500.0000 mg | Freq: Once | INTRAVENOUS | Status: AC
  Administered 2017-06-02: 500 mg via INTRAVENOUS
  Filled 2017-06-02: qty 500

## 2017-06-02 MED ORDER — ASPIRIN 81 MG PO CHEW
81.0000 mg | CHEWABLE_TABLET | Freq: Once | ORAL | Status: AC
  Administered 2017-06-02: 81 mg via ORAL
  Filled 2017-06-02: qty 1

## 2017-06-02 MED ORDER — ATORVASTATIN CALCIUM 10 MG PO TABS
10.0000 mg | ORAL_TABLET | Freq: Every day | ORAL | Status: DC
  Administered 2017-06-03 – 2017-06-07 (×5): 10 mg via ORAL
  Filled 2017-06-02 (×4): qty 1

## 2017-06-02 MED ORDER — LORAZEPAM 1 MG PO TABS
1.0000 mg | ORAL_TABLET | Freq: Every day | ORAL | Status: DC
  Administered 2017-06-02 – 2017-06-06 (×5): 1 mg via ORAL
  Filled 2017-06-02 (×5): qty 1

## 2017-06-02 MED ORDER — ONDANSETRON HCL 4 MG/2ML IJ SOLN
4.0000 mg | Freq: Four times a day (QID) | INTRAMUSCULAR | Status: DC | PRN
  Administered 2017-06-06 (×2): 4 mg via INTRAVENOUS
  Filled 2017-06-02 (×3): qty 2

## 2017-06-02 MED ORDER — ORAL CARE MOUTH RINSE
15.0000 mL | Freq: Two times a day (BID) | OROMUCOSAL | Status: DC
  Administered 2017-06-03 – 2017-06-08 (×3): 15 mL via OROMUCOSAL

## 2017-06-02 MED ORDER — POLYETHYLENE GLYCOL 3350 17 G PO PACK: 17.0000 g | PACK | Freq: Every day | ORAL | Status: DC | PRN

## 2017-06-02 MED ORDER — DEXTROSE 5 % IV SOLN
1.0000 g | Freq: Once | INTRAVENOUS | Status: AC
  Administered 2017-06-02: 1 g via INTRAVENOUS
  Filled 2017-06-02: qty 10

## 2017-06-02 MED ORDER — DEXTROSE 5 % IV SOLN
1.0000 g | INTRAVENOUS | Status: AC
  Administered 2017-06-03 – 2017-06-08 (×6): 1 g via INTRAVENOUS
  Filled 2017-06-02 (×6): qty 10

## 2017-06-02 MED ORDER — DEXTROSE 5 % IV SOLN
500.0000 mg | INTRAVENOUS | Status: AC
  Administered 2017-06-03 – 2017-06-08 (×6): 500 mg via INTRAVENOUS
  Filled 2017-06-02 (×6): qty 500

## 2017-06-02 MED ORDER — METOPROLOL TARTRATE 12.5 MG HALF TABLET
12.5000 mg | ORAL_TABLET | Freq: Two times a day (BID) | ORAL | Status: DC
  Administered 2017-06-02 – 2017-06-07 (×11): 12.5 mg via ORAL
  Filled 2017-06-02 (×11): qty 1

## 2017-06-02 MED ORDER — CHLORHEXIDINE GLUCONATE 0.12 % MT SOLN
15.0000 mL | Freq: Two times a day (BID) | OROMUCOSAL | Status: DC
  Administered 2017-06-03 – 2017-06-08 (×10): 15 mL via OROMUCOSAL
  Filled 2017-06-02 (×7): qty 15

## 2017-06-02 MED ORDER — HEPARIN (PORCINE) IN NACL 100-0.45 UNIT/ML-% IJ SOLN
1000.0000 [IU]/h | INTRAMUSCULAR | Status: DC
  Administered 2017-06-02: 850 [IU]/h via INTRAVENOUS
  Filled 2017-06-02: qty 250

## 2017-06-02 NOTE — ED Provider Notes (Signed)
[facility] EMERGENCY DEPARTMENT Provider Note   CSN: [id number] Arrival date & time: 06/17/2017  0272     History   Chief Complaint Chief Complaint  Patient presents with  . Shortness of Breath    HPI Stephanie Dickerson is a 84 y.o. female with a past medical history of hypertension, osteoarthritis, anxiety, hyperlipidemia, who presents to ED for evaluation of progressively worsening 4-day history of shortness of breath.  Per her son who lives with her, for the past 4 months patient has had a progressive decline in her health including decrease in absence of appetite, nasal congestion, trouble with ambulation and memory, constant nausea and gagging, worsening of her depression and anxiety.  This gradual decline in her health occurred after suffering from a hamstring tear.  However, he is most concerned about her gradual shortness of breath even with walking short distances.  She was evaluated by her PCP earlier in the week and was told that she was suffering from a viral respiratory illness and there is no specific treatment for it.  She brought her in today because she woke up in the middle of the night with worsening shortness of breath and wheezing.  She was given a breathing treatment by EMS with no improvement in her symptoms.  She was initially satting at 75% on room air.  Son denies any prior MI, DVT or PE.  The history is provided by a caregiver and the EMS personnel.    Past Medical History:  Diagnosis Date  . Anxiety/Insomnia - followed by Debbe Bales, psych nurse 02/02/2012  . Cancer ([place])    Breast cancer-Right  . Chicken pox   . Depression   . Glaucoma   . Hyperlipidemia   . Hypertension   . Osteoarthritis - followed by Dr. Lawana Chambers in Ortho 02/02/2012  . Osteopenia     Patient Active Problem List   Diagnosis Date Noted  . History of breast cancer in female 12/18/2015  . Dislocated IOL (intraocular lens), posterior 12/12/2013  . Osteopenia   .  Arthritis   . Cancer ([place])   . Hyperlipidemia 02/02/2012  . Hypertension 02/02/2012  . Hyperglycemia 02/02/2012  . Constipation 02/02/2012  . Anxiety/Insomnia - followed by Debbe Bales, psych nurse 02/02/2012  . Osteoarthritis - followed by Dr. Lawana Chambers in Ortho 02/02/2012  . Breast cancer ([place]) 02/02/2012    Past Surgical History:  Procedure Laterality Date  . arthroscopy knee surgery     left  . BREAST SURGERY     Lumpectomy  . cataract surgery    . LASER PHOTO ABLATION Right 12/17/2013   Procedure: LASER PHOTO ABLATION;  Surgeon: Hayden Pedro, MD;  Location: [place];  Service: Ophthalmology;  Laterality: Right;  . PARS PLANA VITRECTOMY Right 12/17/2013   Procedure: PARS PLANA VITRECTOMY WITH 25G REMOVAL/SUTURE INTRAOCULAR LENS, ENDOLASER;  Surgeon: Hayden Pedro, MD;  Location: [place];  Service: Ophthalmology;  Laterality: Right;  . rotator cuff surgery  2011  . SPINE SURGERY    . TONSILLECTOMY AND ADENOIDECTOMY    . VITRECTOMY Right 12/17/2013   DR MATTHEWS    OB History    Gravida Para Term Preterm AB Living   3 3 3     3    SAB TAB Ectopic Multiple Live Births                   Home Medications    Prior to Admission medications   Medication Sig Start Date End Date Taking?  Authorizing Provider  acetaminophen (TYLENOL) 500 MG tablet Take 500-1,000 mg by mouth every 6 (six) hours as needed for mild pain.   Yes [provider]  amLODipine (NORVASC) 10 MG tablet TAKE ONE TABLET BY MOUTH DAILY   -- NEEDS APPOINTMENT FOR PHYSICAL EXAM FOR REFILLS 01/03/17  Yes Lucretia Kern, DO  aspirin 81 MG tablet Take 81 mg by mouth daily.   Yes [provider]  atorvastatin (LIPITOR) 80 MG tablet TAKE 1/2 TABLETS (40 MG TOTAL) BY MOUTH DAILY. 03/30/17  Yes Lucretia Kern, DO  citalopram (CELEXA) 40 MG tablet Take 40 mg by mouth daily.   Yes [provider]  GLUCOSAMINE-CHONDROITIN PO Take 1 tablet by mouth every morning.    Yes [provider]    imipramine (TOFRANIL) 50 MG tablet Take 100 mg by mouth at bedtime.    Yes [provider]  LORazepam (ATIVAN) 1 MG tablet Take 1 mg by mouth at bedtime.    Yes [provider]  losartan (COZAAR) 100 MG tablet TAKE 1 TABLET (100 MG TOTAL) BY MOUTH DAILY. 03/30/17  Yes Lucretia Kern, DO  methocarbamol (ROBAXIN) 500 MG tablet Take 500 mg by mouth daily as needed for muscle spasms.    Yes [provider]  Polyethylene Glycol 3350 (MIRALAX PO) Take 1 Applicatorful by mouth daily as needed (constipation).    Yes [provider]  Travoprost, BAK Free, (TRAVATAN Z) 0.004 % SOLN ophthalmic solution Place 1 drop into both eyes at bedtime.   Yes [provider]  traZODone (DESYREL) 100 MG tablet Take 200 mg by mouth at bedtime.    Yes [provider]    Family History Family History  Problem Relation Age of Onset  . Bone cancer Mother   . Leukemia Father   . Lung cancer Unknown        husband    Social History Social History   Tobacco Use  . Smoking status: Former Smoker    Types: Cigarettes  . Smokeless tobacco: Never Used  . Tobacco comment: "quit smoking cigarettes around 40 years ago"  Substance Use Topics  . Alcohol use: Yes    Comment: per pt a glass of white wine before dinner   . Drug use: No     Allergies   Lactose intolerance (gi)   Review of Systems Review of Systems  Unable to perform ROS: Acuity of condition  Respiratory: Positive for shortness of breath.      Physical Exam Updated Vital Signs BP (!) 107/54   Pulse (!) 109   Temp (!) 97.4 F (36.3 C) (Axillary)   Resp (!) 25   SpO2 100%   Physical Exam  Constitutional: She appears well-developed and well-nourished. No distress.  On BiPAP.  HENT:  Head: Normocephalic and atraumatic.  Nose: Nose normal.  Eyes: Conjunctivae and EOM are normal. Right eye exhibits no discharge. Left eye exhibits no discharge. No scleral icterus.  Neck: Normal range of  motion. Neck supple.  Cardiovascular: Normal heart sounds. Tachycardia present. Exam reveals no gallop and no friction rub.  No murmur heard. Pulmonary/Chest: Accessory muscle usage present. Tachypnea noted. No respiratory distress. She has decreased breath sounds in the right upper field, the right middle field, the right lower field, the left upper field, the left middle field and the left lower field. She has wheezes in the right upper field and the left upper field.  Abdominal: Soft. Bowel sounds are normal. She exhibits no distension. There  is no tenderness. There is no guarding.  Musculoskeletal: Normal range of motion. She exhibits no edema.  Neurological: She is alert. She exhibits normal muscle tone. Coordination normal.  Skin: Skin is warm and dry. No rash noted.  Psychiatric: She has a normal mood and affect.  Nursing note and vitals reviewed.    ED Treatments / Results  Labs (all labs ordered are listed, but only abnormal results are displayed) Labs Reviewed  BASIC METABOLIC PANEL - Abnormal; Notable for the following components:      Result Value   CO2 20 (*)    Glucose, Bld 146 (*)    BUN 38 (*)    Creatinine, Ser 1.64 (*)    GFR calc non Af Amer 26 (*)    GFR calc Af Amer 30 (*)    All other components within normal limits  CBC - Abnormal; Notable for the following components:   WBC 21.3 (*)    RBC 3.48 (*)    Hemoglobin 10.2 (*)    HCT 32.4 (*)    RDW 15.9 (*)    All other components within normal limits  I-STAT TROPONIN, ED - Abnormal; Notable for the following components:   Troponin i, poc 0.95 (*)    All other components within normal limits  CULTURE, BLOOD (ROUTINE X 2)  CULTURE, BLOOD (ROUTINE X 2)  URINALYSIS, ROUTINE W REFLEX MICROSCOPIC  INFLUENZA PANEL BY PCR (TYPE A & B)  I-STAT CG4 LACTIC ACID, ED    EKG  EKG Interpretation  Date/Time:  Friday June 02 2017 07:25:11 EST Ventricular Rate:  84 PR Interval:    QRS Duration: 136 QT  Interval:  407 QTC Calculation: 482 R Axis:   -48 Text Interpretation:  Sinus rhythm Atrial premature complexes RBBB and LAFB Baseline wander in lead(s) V1 similar pattern prior 8/15 Confirmed by Aletta Edouard [phone]) on 06/05/2017 7:31:36 AM Also confirmed by Aletta Edouard [phone]), editor Hattie Perch ([id number])  on 06/10/2017 7:44:02 AM       Radiology Dg Chest Portable 1 View  Result Date: 06/20/2017 CLINICAL DATA:  Labored breathing today. EXAM: PORTABLE CHEST 1 VIEW COMPARISON:  PA and lateral chest 11/26/2006. FINDINGS: The patient has a large left pleural effusion with associated airspace disease. The right lung is clear. Cardiac silhouette is largely obscured. No acute bony abnormality is identified. Postoperative change left acromioclavicular joint is seen. Severe degenerative disease about the left glenohumeral joint is noted. IMPRESSION: Large left pleural effusion and airspace disease. Electronically Signed   By: Inge Rise M.D.   On: 06/06/2017 07:47    Procedures Procedures (including critical care time)  CRITICAL CARE Performed by: Delia Heady   Total critical care time: 40 minutes  Critical care time was exclusive of separately billable procedures and treating other patients.  Critical care was necessary to treat or prevent imminent or life-threatening deterioration.  Critical care was time spent personally by me on the following activities: development of treatment plan with patient and/or surrogate as well as nursing, discussions with consultants, evaluation of patient's response to treatment, examination of patient, obtaining history from patient or surrogate, ordering and performing treatments and interventions, ordering and review of laboratory studies, ordering and review of radiographic studies, pulse oximetry and re-evaluation of patient's condition.   Medications Ordered in ED Medications  azithromycin (ZITHROMAX) 500 mg in dextrose 5 % 250 mL IVPB  (500 mg Intravenous New Bag/Given 06/26/2017 0909)  azithromycin (ZITHROMAX) 500 mg in dextrose 5 % 250 mL IVPB (not  administered)  cefTRIAXone (ROCEPHIN) 1 g in dextrose 5 % 50 mL IVPB (not administered)  aspirin chewable tablet 81 mg (not administered)  cefTRIAXone (ROCEPHIN) 1 g in dextrose 5 % 50 mL IVPB (0 g Intravenous Stopped 06/12/2017 0850)     Initial Impression / Assessment and Plan / ED Course  I have reviewed the triage vital signs and the nursing notes.  Pertinent labs & imaging results that were available during my care of the patient were reviewed by me and considered in my medical decision making (see chart for details).  Clinical Course as of Jun 03 935  Fri Jun 03, 1731  3168 84 year old female lives at home with her son with URI symptoms for a few days with increasing shortness of breath dyspnea on exertion.  This morning symptoms are more severe and the son called EMS who found her with sats in the 15s.  They put her on breather transported her here where she needed to be placed on BiPAP.  Currently she is awake alert satting well on BiPAP.  Lungs are clear.  Heart is regular.  Abdomen is soft.  He has intact distal pulses and no peripheral edema.  Her initial EKG is a right bundle branch block and her labs are shown to come back now with an elevated troponin.  Her white count is also elevated and we are doing culture is getting flu testing.  She will need to be admitted to the hospital.  [MB]    Clinical Course User Index [MB] Hayden Rasmussen, MD    Patient presents to ED for evaluation of 4-day history of progressively worsening shortness of breath.  Symptoms got worse last night and when EMS was called they found her with sats in the 30s.  They put her on CPAP and was transported here and immediately put on BiPAP.  Since being put on BiPAP, her oxygen levels have obviously improved to 98-100%.  Chest x-ray shows fluid in the left lung and infection cannot be excluded.  She was  mildly tachycardic as well.  She had no improvement with breathing treatment given by EMS.  Lactate is normal however leukocytosis at 21.  I suspect that she does have some sort of pneumonia that is causing her to possibly be septic.  She was began on broad-spectrum antibiotics.  Her troponin was also elevated to 0.95.  I did speak to the son briefly about goals of care for the patient and aggressiveness of therapy.  It appears that this is probably the first time that he has had to have this discussion about the patient because he states that "4 months ago she was walking around normally, normal energy and appetite, and smarter than me."  I also spoke to the hospitalist who stated that the son should decide the aggressiveness of the therapy that he wants to put his mother through, her code status, etc.  He told me that he would decide after speaking to the hospitalist.  I did consult cardiology, however there is no decision made about what treatments the family wants to proceed for the patient so they have no recommendations at this time.  She was given aspirin in the meantime.  Will leave the decision making up to son, who will tell hospitalist his thoughts, and proceed accordingly.  Patient discussed with and seen by Dr. Melina Copa.  Final Clinical Impressions(s) / ED Diagnoses   Final diagnoses:  None    ED Discharge Orders    None  Delia Heady, PA-C 06/08/2017 1980    Hayden Rasmussen, MD 06/03/17 [phone]

## 2017-06-02 NOTE — H&P (Signed)
History and Physical:    Stephanie Dickerson   KVQ:[id number] DOB: 10/28/1923 DOA: 06/17/2017  Referring MD/provider: Delia Heady PA PCP: Lucretia Kern, DO   Patient coming from: Home  Chief Complaint: Increasing shortness of breath  History of Present Illness:   Stephanie Dickerson is an 84 y.o. female who was brought in from home by her son with whom she lives for increasing shortness of breath. History is entirely per patient's son as patient was on BiPAP with increased work of breathing.  Patient is a 84 year old female with a past medical history significant for hypertension and hyperlipidemia and multiple orthopedic issues. She lives at home with her son and until 6 months ago was independent around the house, was able to come to dining room to eat and do her own bathing and toileting. 6 months ago patient sustained a hamstring tear which made her temporarily bedbound but from which she recovered enough to go to the bathroom on her own. 2 weeks ago patient developed a URI and was seen by her PCP and was treated with over-the-counter Tylenol sinus. He noted that she became increasingly fatigued, had anorexia which was very unusual for her and essentially did not leave her bed except to go to the bathroom. 3-4 days prior to admission patient's son noted that his mother was very short of breath returning from the bathroom and that she seemed to be hallucinating. The day before admission son noted that patient had labored breathing just lying in bed and that she was lethargic and says brought her into the ER.  ED Course:  The patient was noted to have O2 sat of 75% on admission and that she was wheezing. She was treated with inhaled bronchodilators. Chest x-ray revealed large left pleural effusion with airspace disease. Patient was started on ceftriaxone and azithromycin. Asian continued to have labored breathing with oxygen despite supplemental oxygen and was placed on BiPAP. She is  tolerating this well.  ROS:   ROS   Review of Systems: Unable to do due to market shortness of breath and BiPAP use.  Past Medical History:   Past Medical History:  Diagnosis Date  . Anxiety/Insomnia - followed by Debbe Bales, psych nurse 02/02/2012  . Cancer ([place])    Breast cancer-Right  . Chicken pox   . Depression   . Glaucoma   . Hyperlipidemia   . Hypertension   . Osteoarthritis - followed by Dr. Lawana Chambers in Ortho 02/02/2012  . Osteopenia     Past Surgical History:   Past Surgical History:  Procedure Laterality Date  . arthroscopy knee surgery     left  . BREAST SURGERY     Lumpectomy  . cataract surgery    . LASER PHOTO ABLATION Right 12/17/2013   Procedure: LASER PHOTO ABLATION;  Surgeon: Hayden Pedro, MD;  Location: [facility];  Service: Ophthalmology;  Laterality: Right;  . PARS PLANA VITRECTOMY Right 12/17/2013   Procedure: PARS PLANA VITRECTOMY WITH 25G REMOVAL/SUTURE INTRAOCULAR LENS, ENDOLASER;  Surgeon: Hayden Pedro, MD;  Location: [facility];  Service: Ophthalmology;  Laterality: Right;  . rotator cuff surgery  2011  . SPINE SURGERY    . TONSILLECTOMY AND ADENOIDECTOMY    . VITRECTOMY Right 12/17/2013   DR MATTHEWS    Social History:   Social History   Socioeconomic History  . Marital status: Widowed    Spouse name: Not on file  . Number of children: Not on file  . Years of  education: Not on file  . Highest education level: Not on file  Social Needs  . Financial resource strain: Not on file  . Food insecurity - worry: Not on file  . Food insecurity - inability: Not on file  . Transportation needs - medical: Not on file  . Transportation needs - non-medical: Not on file  Occupational History  . Not on file  Tobacco Use  . Smoking status: Former Smoker    Types: Cigarettes  . Smokeless tobacco: Never Used  . Tobacco comment: "quit smoking cigarettes around 40 years ago"  Substance and Sexual Activity  . Alcohol use: Yes    Comment: per pt a  glass of white wine before dinner   . Drug use: No  . Sexual activity: Yes    Birth control/protection: Post-menopausal  Other Topics Concern  . Not on file  Social History Narrative  . Not on file    Allergies   Lactose intolerance (gi)  Family history:   Family History  Problem Relation Age of Onset  . Bone cancer Mother   . Leukemia Father   . Lung cancer Unknown        husband    Current Medications:   Prior to Admission medications   Medication Sig Start Date End Date Taking? Authorizing Provider  acetaminophen (TYLENOL) 500 MG tablet Take 500-1,000 mg by mouth every 6 (six) hours as needed for mild pain.   Yes [provider]  amLODipine (NORVASC) 10 MG tablet TAKE ONE TABLET BY MOUTH DAILY   -- NEEDS APPOINTMENT FOR PHYSICAL EXAM FOR REFILLS 01/03/17  Yes Lucretia Kern, DO  aspirin 81 MG tablet Take 81 mg by mouth daily.   Yes [provider]  atorvastatin (LIPITOR) 80 MG tablet TAKE 1/2 TABLETS (40 MG TOTAL) BY MOUTH DAILY. 03/30/17  Yes Lucretia Kern, DO  citalopram (CELEXA) 40 MG tablet Take 40 mg by mouth daily.   Yes [provider]  GLUCOSAMINE-CHONDROITIN PO Take 1 tablet by mouth every morning.    Yes [provider]  imipramine (TOFRANIL) 50 MG tablet Take 100 mg by mouth at bedtime.    Yes [provider]  LORazepam (ATIVAN) 1 MG tablet Take 1 mg by mouth at bedtime.    Yes [provider]  losartan (COZAAR) 100 MG tablet TAKE 1 TABLET (100 MG TOTAL) BY MOUTH DAILY. 03/30/17  Yes Lucretia Kern, DO  methocarbamol (ROBAXIN) 500 MG tablet Take 500 mg by mouth daily as needed for muscle spasms.    Yes [provider]  Polyethylene Glycol 3350 (MIRALAX PO) Take 1 Applicatorful by mouth daily as needed (constipation).    Yes [provider]  Travoprost, BAK Free, (TRAVATAN Z) 0.004 % SOLN ophthalmic solution Place 1 drop into both eyes at bedtime.   Yes [provider]  traZODone  (DESYREL) 100 MG tablet Take 200 mg by mouth at bedtime.    Yes [provider]    Physical Exam:   Vitals:   06/08/2017 0657 06/11/2017 0701 06/27/2017 0709 06/04/2017 0811  BP:  126/88  (!) 107/54  Pulse:  78  (!) 109  Resp:  (!) 29 20 (!) 25  Temp:  (!) 97.4 F (36.3 C)    TempSrc:  Axillary    SpO2: 98% 100% 100% 100%     Physical Exam: Blood pressure (!) 107/54, pulse (!) 109, temperature (!) 97.4 F (36.3 C), temperature source Axillary, resp. rate (!) 25, SpO2 100 %. Gen:  Elderly female lying in bed at 30 with BiPAP on face with moderate seal with respiratory rate around 24 unlabored. Patient able to answer yes no questions without difficulty. Eyes: Sclerae anicteric. Conjunctiva mildly injected. Chest: Difficult to assess given mechanical breath sounds throughout.  CV: Distant, regular, no audible murmurs. Abdomen: NABS, obese, soft, nondistended, nontender. No tenderness to light or deep palpation. Extremities: No edema.  Skin: Warm and dry. No rashes, lesions or wounds. Neuro: Alert and oriented times 3, able to move all 4 extremities without difficulty. Psych: Patient is on BiPAP and nods yes that she understands what's going on and agrees to plan as I have described.  Data Review:    Labs: Basic Metabolic Panel: Recent Labs  Lab 06/20/2017 0710  NA 142  K 4.5  CL 108  CO2 20*  GLUCOSE 146*  BUN 38*  CREATININE 1.64*  CALCIUM 8.9   Liver Function Tests: No results for input(s): AST, ALT, ALKPHOS, BILITOT, PROT, ALBUMIN in the last 168 hours. No results for input(s): LIPASE, AMYLASE in the last 168 hours. No results for input(s): AMMONIA in the last 168 hours. CBC: Recent Labs  Lab 06/29/2017 0710  WBC 21.3*  HGB 10.2*  HCT 32.4*  MCV 93.1  PLT 247   Cardiac Enzymes: No results for input(s): CKTOTAL, CKMB, CKMBINDEX, TROPONINI in the last 168 hours.  BNP (last 3 results) No results for input(s): PROBNP in the last 8760 hours. CBG: No results  for input(s): GLUCAP in the last 168 hours.  Urinalysis    Component Value Date/Time   COLORURINE YELLOW 11/26/2007 1115   APPEARANCEUR CLEAR 11/26/2007 1115   LABSPEC 1.011 11/26/2007 1115   PHURINE 7.0 11/26/2007 1115   GLUCOSEU NEGATIVE 11/26/2007 1115   HGBUR NEGATIVE 11/26/2007 1115   BILIRUBINUR NEGATIVE 11/26/2007 1115   [place] 11/26/2007 1115   PROTEINUR NEGATIVE 11/26/2007 1115   UROBILINOGEN 0.2 11/26/2007 1115   NITRITE NEGATIVE 11/26/2007 1115   LEUKOCYTESUR LARGE (A) 11/26/2007 1115      Radiographic Studies: Dg Chest Portable 1 View  Result Date: 06/26/2017 CLINICAL DATA:  Labored breathing today. EXAM: PORTABLE CHEST 1 VIEW COMPARISON:  PA and lateral chest 11/26/2006. FINDINGS: The patient has a large left pleural effusion with associated airspace disease. The right lung is clear. Cardiac silhouette is largely obscured. No acute bony abnormality is identified. Postoperative change left acromioclavicular joint is seen. Severe degenerative disease about the left glenohumeral joint is noted. IMPRESSION: Large left pleural effusion and airspace disease. Electronically Signed   By: Inge Rise M.D.   On: 06/16/2017 07:47    EKG: Independently reviewed. Sinus rhythm at 85. She does have a few PACs. RBBB. Left axis deviation with left anterior hemiblock. No acute ST-T wave changes although she does have flattened T waves diffusely.   Assessment/Plan:   Principal Problem:   Acute respiratory failure with hypoxia (HCC) Active Problems:   Hyperlipidemia   Hypertension   NSTEMI (non-ST elevated myocardial infarction) ([place])   Pleural effusion   CAP (community acquired pneumonia)   Counseling regarding end of life decision making   Acute renal disease   ACUTE RESPIRATORY FAILURE Patient's acute respiratory failure with hypoxia as most likely secondary to her pleural effusion which is most likely a parapneumonic effusion from community-acquired  pneumonia. Patient is also having an NSTEMI and may have a component of pulmonary edema as well. Patient is requiring BiPAP which she is tolerating well. Her O2 sats are 98% and she appears comfortable without  any labored breathing. Will continue BiPAP. Continue ceftriaxone and azithromycin for community-acquired pneumonia. Will not diurese at present as I suspect effusion is likely parapneumonic. Echocardiogram ordered  ACS Patient with elevated troponins and normal EKG, no chest pain. Most likely type 2/demand ischemia from CAP and tachycardia and 3 days of hypoxia. Nonetheless patient has received aspirin 325 by mouth 1 today and heparin has been started. [facility] allergy consultation has been called per son's request. Will follow troponin, provide oxygen with antiplatelets as above. Will add metoprolol 12.5 by mouth twice a day which can be uptitrated as needed. Patient is already on losartan at home which was continued. Patient is also on atorvastatin which is also continued.  HTN Patient is usually on amlodipine and losartan at home. Amlodipine has been held and metoprolol 12.5 twice a day has been started which can be uptitrated as needed. Losartan has been continued. She is probably somewhat fluid down at present and blood pressure may well rise when she is more fluid resuscitated. Follow closely.  RENAL INSUFFICIENCY Patient with known baseline renal insufficiency, thought to be secondary to Celebrex use vs advanced age I will provide gentle hydration as patient has had significantly decreased by mouth intake for 2 weeks and has an acute infection  Will avoid renal toxic medications  ANXIETY/DEPRESSION Patient is on multiple anti-anxiety and depression medications including high doses of trazodone 200 mg and imipramine, Celexa and Ativan. I have requested pharmacy tech confirmed that these are indeed her medications at the dose as indicated. I have continued these medications as  would like to avoid withdrawal syndromes. Will need to watch her QT I'll of these medications Would consolidate medications as an outpatient as is warranted.  END OF LIFE DISCUSSION Long discussion about end-of-life with patient's son given patient's advanced age and what appears to be a large pneumonia  with a large parapneumonic effusion and an NSTEMI Discussed possible course including complications that commonly occur in the hospital especially given advanced age.  Patient has recently signed a living will which makes her DO NOT INTUBATE and DO NOT RESUSCITATE but would like full aggressive care until and unless she has cardiac or respiratory arrest.  HL  Continue atorvastatin  Patient is DO NOT RESUSCITATE    Other information:   DVT prophylaxis: On full dose heparin for ACS  Code Status: DO NOT RESUSCITATE  Family Communication: Son at bedside he is her durable attorney  Disposition Plan: Likely inpatient rehabilitation  Consults called: Cardiology  Admission status: Inpatient   The medical decision making on this patient was of high complexity and the patient is at high risk for clinical deterioration, therefore this is a level 3 visit.   Dewaine Oats Derek Jack [facility] Pager [phone] Cell: [phone]   If 7PM-7AM, please contact night-coverage [email or web address] Password [email or web address] 06/04/2017, 10:08 AM

## 2017-06-02 NOTE — ED Notes (Addendum)
ED Provider at bedside. 

## 2017-06-02 NOTE — Progress Notes (Signed)
ANTICOAGULATION CONSULT NOTE - Follow Up Consult  Pharmacy Consult for heparin Indication: chest pain/ACS  Allergies  Allergen Reactions  . Lactose Intolerance (Gi) Diarrhea    Patient Measurements: Height: 5\' 3"  (160 cm)(patient reported) Weight: 175 lb (79.4 kg)(patient reported) IBW/kg (Calculated) : 52.4 Heparin Dosing Weight: 70 kg  Vital Signs: Temp: 98.9 F (37.2 C) (02/01 1948) Temp Source: Oral (02/01 1948) BP: 110/61 (02/01 1948) Pulse Rate: 75 (02/01 1948)  Labs: Recent Labs    06/14/2017 0710 06/20/2017 1053 06/02/2017 1741  HGB 10.2*  --   --   HCT 32.4*  --   --   PLT 247  --   --   HEPARINUNFRC  --   --  0.14*  CREATININE 1.64*  --   --   TROPONINI  --  1.56*  --     Estimated Creatinine Clearance: 22.3 mL/min (A) (by C-G formula based on SCr of 1.64 mg/dL (H)).   Medical History: Past Medical History:  Diagnosis Date  . Anxiety/Insomnia - followed by Debbe Bales, psych nurse 02/02/2012  . Cancer ([place])    Breast cancer-Right  . Chicken pox   . Depression   . Glaucoma   . Hyperlipidemia   . Hypertension   . Osteoarthritis - followed by Dr. Lawana Chambers in Ortho 02/02/2012  . Osteopenia     Medications:  No anticoagulants prior to admission.  Assessment: 84 year old Female presented to ED with SOB and poor appetite. Troponin elevated and cardiology consulted.  Hemoglobin 10.2 but stable.  Platelets within normal limits.  No bleeding reported.  Heparin drip 850 uts/hr HL 0.14 < goal.    Goal of Therapy:  Heparin level 0.3-0.7 units/ml Monitor platelets by anticoagulation protocol: Yes   Plan:  -Increase Heparin drip 1000 units/hr, no bolus due to age and clinical presentation -monitor daily heparin level, daily CBC, signs/symptoms of bleeding  Bonnita Nasuti Pharm.D. CPP, BCPS Clinical Pharmacist [phone] 06/17/2017 7:51 PM

## 2017-06-02 NOTE — Progress Notes (Signed)
Pt. In Resp. Distress. RT called by RN to place pt. On bipap with MD approval.

## 2017-06-02 NOTE — ED Notes (Signed)
Respiratory at bedside to apply Bipap.

## 2017-06-02 NOTE — ED Triage Notes (Addendum)
Per [facility], pt called out from home due to waking up with shortness of breath and increased SOB with exertion. Per [facility], pt diminished in all fields and wheezing in upper. Pt was 84% on RA and then placed on non-rebreather and now 98%. Pt was A. Fib on monitor with no known hx. Pt heart rate 80-120 with EMS. Pt has hx of HTN. Pt denying pain. Pt has had declining in health in past few months. Pt received albuterol and atrovent en route. EMS reports no improvement from receiving breathing tx.

## 2017-06-02 NOTE — Progress Notes (Signed)
Patient transported on the BiPAP to 4E09. Vitals remained stable.

## 2017-06-02 NOTE — ED Notes (Signed)
Pt's BiPap removed for approx two minutes to take PO meds, tolerated swallow well but without mask desaturated to 90% on room air

## 2017-06-02 NOTE — Progress Notes (Signed)
Patient removed from BiPAP and placed on 6 Lpm nasal cannula. Patient began to feel SOB after about 10 minutes. Labored breathing. Placed patient back on BiPAP at this time. Will continue to monitor.

## 2017-06-02 NOTE — Progress Notes (Signed)
Pharmacy Antibiotic Note  Stephanie Dickerson is a 84 y.o. female admitted on 06/22/2017 with pneumonia and sepsis.  Pharmacy has been consulted for azithromycin and ceftriaxone dosing.  Plan: - azithromycin 500 mg IV every 24 hours - ceftriaxone 1 gram IV every 24 hours - pharmacy will sign off as no renal adjustments are needed      Temp (24hrs), Avg:97.4 F (36.3 C), Min:97.4 F (36.3 C), Max:97.4 F (36.3 C)  Recent Labs  Lab 06/07/2017 0710 06/06/2017 0752  WBC 21.3*  --   CREATININE 1.64*  --   LATICACIDVEN  --  1.38    CrCl cannot be calculated (Unknown ideal weight.).    Allergies  Allergen Reactions  . Lactose Intolerance (Gi) Diarrhea    Antimicrobials this admission: 2/1 azithromycin >>  2/1 ceftriaxone >>   Dose adjustments this admission: N/A  Microbiology results: 2/1 BCx: x 2 pending  Please consult if further assistance is needed. Thank you for allowing pharmacy to be a part of this patient's care.   Deboraha Sprang, PharmD 06/19/2017 7:58 AM

## 2017-06-02 NOTE — ED Notes (Addendum)
Date and time results received: 06/22/2017 1200 (use smartphrase ".now" to insert current time)  Test: troponin Critical Value: 1.56  Name of Provider Notified: Delia Heady, PA  Orders Received? Or Actions Taken?: n/a

## 2017-06-02 NOTE — Progress Notes (Signed)
Pt arrived on the unit.Bipap, VS stable, A/O x3, calm and cooperative.  Son at the bedside. States he wants an update from the doctor.  Clothes are at the bedside. Son will bring glasses and dentures from home. Took all medications from the patient home. Bed in low position, alarms are on, call bell in reach, reminding about frequent turns. Continue to monitor

## 2017-06-02 NOTE — Progress Notes (Signed)
  Echocardiogram 2D Echocardiogram has been performed.  Stephanie Dickerson 06/27/2017, 3:07 PM

## 2017-06-02 NOTE — Progress Notes (Signed)
ANTICOAGULATION CONSULT NOTE - Initial Consult  Pharmacy Consult for heparin Indication: chest pain/ACS  Allergies  Allergen Reactions  . Lactose Intolerance (Gi) Diarrhea    Patient Measurements: Height: 5\' 3"  (160 cm)(patient reported) Weight: 175 lb (79.4 kg)(patient reported) IBW/kg (Calculated) : 52.4 Heparin Dosing Weight: 70 kg  Vital Signs: Temp: 97.4 F (36.3 C) (02/01 0701) Temp Source: Axillary (02/01 0701) BP: 107/54 (02/01 0811) Pulse Rate: 109 (02/01 0811)  Labs: Recent Labs    06/11/2017 0710  HGB 10.2*  HCT 32.4*  PLT 247  CREATININE 1.64*    Estimated Creatinine Clearance: 22.3 mL/min (A) (by C-G formula based on SCr of 1.64 mg/dL (H)).   Medical History: Past Medical History:  Diagnosis Date  . Anxiety/Insomnia - followed by Debbe Bales, psych nurse 02/02/2012  . Cancer ([place])    Breast cancer-Right  . Chicken pox   . Depression   . Glaucoma   . Hyperlipidemia   . Hypertension   . Osteoarthritis - followed by Dr. Lawana Chambers in Ortho 02/02/2012  . Osteopenia     Medications:  No anticoagulants prior to admission.  Assessment: 84 year old Female presented to ED with SOB and poor appetite. Troponin elevated at 0.95 and cardiology consulted.  Hemoglobin 10.2 but stable.  Platelets within normal limits.  No bleeding reported.   Goal of Therapy:  Heparin level 0.3-0.7 units/ml Monitor platelets by anticoagulation protocol: Yes   Plan:  -Heparin drip 850 units/hr, no bolus due to age and clinical presentation -First heparin level 1900 -monitor daily heparin level, daily CBC, signs/symptoms of bleeding  Shayne Deerman C Cace Osorto, PharmD 06/06/2017,10:30 AM

## 2017-06-03 ENCOUNTER — Inpatient Hospital Stay (HOSPITAL_COMMUNITY): Payer: Medicare Other

## 2017-06-03 ENCOUNTER — Other Ambulatory Visit (HOSPITAL_COMMUNITY): Payer: Medicare Other

## 2017-06-03 DIAGNOSIS — J181 Lobar pneumonia, unspecified organism: Secondary | ICD-10-CM

## 2017-06-03 LAB — BODY FLUID CELL COUNT WITH DIFFERENTIAL
EOS FL: 8 %
Lymphs, Fluid: 46 %
MONOCYTE-MACROPHAGE-SEROUS FLUID: 4 % — AB (ref 50–90)
Neutrophil Count, Fluid: 42 % — ABNORMAL HIGH (ref 0–25)
Total Nucleated Cell Count, Fluid: 205 cu mm (ref 0–1000)

## 2017-06-03 LAB — BASIC METABOLIC PANEL
Anion gap: 8 (ref 5–15)
BUN: 38 mg/dL — ABNORMAL HIGH (ref 6–20)
CALCIUM: 8.2 mg/dL — AB (ref 8.9–10.3)
CO2: 22 mmol/L (ref 22–32)
CREATININE: 1.65 mg/dL — AB (ref 0.44–1.00)
Chloride: 111 mmol/L (ref 101–111)
GFR calc non Af Amer: 26 mL/min — ABNORMAL LOW (ref >60)
GFR, EST AFRICAN AMERICAN: 30 mL/min — AB (ref >60)
GLUCOSE: 98 mg/dL (ref 65–99)
Potassium: 5 mmol/L (ref 3.5–5.1)
Sodium: 141 mmol/L (ref 135–145)

## 2017-06-03 LAB — TYPE AND SCREEN
ABO/RH(D): A POS
ANTIBODY SCREEN: NEGATIVE

## 2017-06-03 LAB — LIPID PANEL
Cholesterol: 109 mg/dL (ref 0–200)
HDL: 53 mg/dL (ref >40)
LDL CALC: 34 mg/dL (ref 0–99)
Total CHOL/HDL Ratio: 2.1 RATIO
Triglycerides: 111 mg/dL (ref <150)
VLDL: 22 mg/dL (ref 0–40)

## 2017-06-03 LAB — GLUCOSE, CAPILLARY: Glucose-Capillary: 107 mg/dL — ABNORMAL HIGH (ref 65–99)

## 2017-06-03 LAB — CBC
HEMATOCRIT: 29.2 % — AB (ref 36.0–46.0)
Hemoglobin: 9.1 g/dL — ABNORMAL LOW (ref 12.0–15.0)
MCH: 29.5 pg (ref 26.0–34.0)
MCHC: 31.2 g/dL (ref 30.0–36.0)
MCV: 94.8 fL (ref 78.0–100.0)
Platelets: 227 10*3/uL (ref 150–400)
RBC: 3.08 MIL/uL — AB (ref 3.87–5.11)
RDW: 16.6 % — ABNORMAL HIGH (ref 11.5–15.5)
WBC: 15.1 10*3/uL — AB (ref 4.0–10.5)

## 2017-06-03 LAB — CBC WITH DIFFERENTIAL/PLATELET
BASOS ABS: 0 10*3/uL (ref 0.0–0.1)
BASOS PCT: 0 %
EOS PCT: 4 %
Eosinophils Absolute: 0.7 10*3/uL (ref 0.0–0.7)
HCT: 29.3 % — ABNORMAL LOW (ref 36.0–46.0)
Hemoglobin: 9.1 g/dL — ABNORMAL LOW (ref 12.0–15.0)
LYMPHS PCT: 7 %
Lymphs Abs: 1 10*3/uL (ref 0.7–4.0)
MCH: 29.7 pg (ref 26.0–34.0)
MCHC: 31.1 g/dL (ref 30.0–36.0)
MCV: 95.8 fL (ref 78.0–100.0)
MONO ABS: 1.1 10*3/uL — AB (ref 0.1–1.0)
Monocytes Relative: 7 %
NEUTROS ABS: 12.5 10*3/uL — AB (ref 1.7–7.7)
Neutrophils Relative %: 82 %
PLATELETS: 215 10*3/uL (ref 150–400)
RBC: 3.06 MIL/uL — AB (ref 3.87–5.11)
RDW: 16.8 % — AB (ref 11.5–15.5)
WBC: 15.3 10*3/uL — AB (ref 4.0–10.5)

## 2017-06-03 LAB — GRAM STAIN: GRAM STAIN: NONE SEEN

## 2017-06-03 LAB — HEPARIN LEVEL (UNFRACTIONATED): HEPARIN UNFRACTIONATED: 0.58 [IU]/mL (ref 0.30–0.70)

## 2017-06-03 LAB — ABO/RH: ABO/RH(D): A POS

## 2017-06-03 LAB — PROTIME-INR
INR: 1.19
Prothrombin Time: 15 seconds (ref 11.4–15.2)

## 2017-06-03 MED ORDER — HYDROMORPHONE HCL 1 MG/ML IJ SOLN
0.5000 mg | INTRAMUSCULAR | Status: DC | PRN
  Administered 2017-06-03: 0.5 mg via INTRAVENOUS
  Filled 2017-06-03: qty 0.5

## 2017-06-03 MED ORDER — HYDRALAZINE HCL 20 MG/ML IJ SOLN: 10.0000 mg | Freq: Three times a day (TID) | INTRAMUSCULAR | Status: DC | PRN

## 2017-06-03 MED ORDER — LORAZEPAM 2 MG/ML IJ SOLN: 0.5000 mg | Freq: Four times a day (QID) | INTRAMUSCULAR | Status: DC | PRN

## 2017-06-03 MED ORDER — ALBUTEROL SULFATE (2.5 MG/3ML) 0.083% IN NEBU: 2.5000 mg | INHALATION_SOLUTION | RESPIRATORY_TRACT | Status: DC | PRN

## 2017-06-03 MED ORDER — ASPIRIN EC 325 MG PO TBEC: 325.0000 mg | DELAYED_RELEASE_TABLET | Freq: Every day | ORAL | Status: DC

## 2017-06-03 MED ORDER — ALBUTEROL SULFATE (2.5 MG/3ML) 0.083% IN NEBU
2.5000 mg | INHALATION_SOLUTION | Freq: Four times a day (QID) | RESPIRATORY_TRACT | Status: DC
  Administered 2017-06-03 – 2017-06-05 (×8): 2.5 mg via RESPIRATORY_TRACT
  Filled 2017-06-03 (×8): qty 3

## 2017-06-03 NOTE — Progress Notes (Signed)
[facility] PROGRESS NOTE  Stephanie Dickerson ZDG:[id number] DOB: 1924/02/25 DOA: 06/26/2017 PCP: Stephanie Kern, DO  Assessment/Plan:  Acute Resp Failure 2/2 Pleural Effusion  Chest x-ray this a.m. shows that pleural effusion has enlarged Critical care consulted for chest tube placement Likely parapneumonic effusion or related to the patient's past history of breast cancer Chest tube plan for later on today after chest CT results are back Cont azithro & rocephin Sch & prn albuterol  NSTEMI When necessary nitroglycerin sublingual Echo ordered Continue heparin Daily aspirin   Hypertension When necessary hydralazine 10 mg IV as needed for severe blood pressure Cont cozaar, lopressor  Hyperlipidemia Continue statin  Anxiety/Depression Cont celexa,, desyrel  Hyperlipidemia Continue statin   Code Status: Full Code Family Communication: Son at bedside (indicate person spoken with, relationship, and if by phone, the number) Disposition Plan: TBD   Consultants:  PulmonaryPulmonary  Procedures:  ---  Antibiotics:  Azithromycin and Rocephin 21/-> P (indicate start date, and stop date if known)  HPI/Subjective: and per nursing. patient will bipap. attempt to wean her tenderness At This Morning. [name]. No events overnight per nursing.  Patient did well on BiPAP.  Attempt to wean her to nasal cannula this morning.  Patient failed.  Objective: Vitals:   06/03/17 0852 06/03/17 0900  BP:    Pulse: 79 76  Resp: 20 (!) 27  Temp:    SpO2: 96% 97%    Intake/Output Summary (Last 24 hours) at 06/03/2017 1112 Last data filed at 06/03/2017 9518 Gross per 24 hour  Intake 2031.95 ml  Output -  Net 2031.95 ml   Filed Weights   06/06/2017 1005 06/03/17 0448  Weight: 79.4 kg (175 lb) 83.4 kg (183 lb 13.8 oz)    Exam:   General:  NCAT, anxious  Cardiovascular: tachy, no MRG  Respiratory: Tachypnea, incr WOB, bipap  Abdomen: ND, BS+, NT  Musculoskeletal:  moving all extr     Data Reviewed: Basic Metabolic Panel: Recent Labs  Lab 06/16/2017 0710 06/03/17 0246  NA 142 141  K 4.5 5.0  CL 108 111  CO2 20* 22  GLUCOSE 146* 98  BUN 38* 38*  CREATININE 1.64* 1.65*  CALCIUM 8.9 8.2*   Liver Function Tests: No results for input(s): AST, ALT, ALKPHOS, BILITOT, PROT, ALBUMIN in the last 168 hours. No results for input(s): LIPASE, AMYLASE in the last 168 hours. No results for input(s): AMMONIA in the last 168 hours. CBC: Recent Labs  Lab 06/22/2017 0710 06/03/17 0246  WBC 21.3* 15.1*  HGB 10.2* 9.1*  HCT 32.4* 29.2*  MCV 93.1 94.8  PLT 247 227   Cardiac Enzymes: Recent Labs  Lab 06/17/2017 1053 06/12/2017 1741 06/27/2017 2251  TROPONINI 1.56* 1.38* 1.57*   BNP (last 3 results) No results for input(s): BNP in the last 8760 hours.  ProBNP (last 3 results) No results for input(s): PROBNP in the last 8760 hours.  CBG: No results for input(s): GLUCAP in the last 168 hours.  Recent Results (from the past 240 hour(s))  Blood Culture (routine x 2)     Status: None (Preliminary result)   Collection Time: 06/26/2017  7:40 AM  Result Value Ref Range Status   Specimen Description BLOOD RIGHT WRIST  Final   Special Requests IN PEDIATRIC BOTTLE Blood Culture adequate volume  Final   Culture   Final    NO GROWTH 1 DAY Performed at [facility], [address], [place], [place] [address]    Report Status  PENDING  Incomplete  Blood Culture (routine x 2)     Status: None (Preliminary result)   Collection Time: 06/28/2017  7:51 AM  Result Value Ref Range Status   Specimen Description BLOOD RIGHT HAND  Final   Special Requests   Final    BOTTLES DRAWN AEROBIC ONLY Blood Culture adequate volume   Culture   Final    NO GROWTH 1 DAY Performed at [facility], [place] [address], [place], [place] [address]    Report Status PENDING  Incomplete     Studies: Dg Chest Port 1 View  Result Date: 06/03/2017 CLINICAL DATA:  Pleural effusion  EXAM: PORTABLE CHEST 1 VIEW COMPARISON:  06/22/2017 FINDINGS: Moderate to large left pleural effusion. Left mid/lower lung is obscured with suspected compressive atelectasis. Right lung is essentially clear. Cardiomediastinal silhouette is obscured. No pneumothorax. IMPRESSION: Moderate to large left pleural effusion, grossly unchanged. Electronically Signed   By: Julian Hy M.D.   On: 06/03/2017 09:29   Dg Chest Portable 1 View  Result Date: 06/22/2017 CLINICAL DATA:  Labored breathing today. EXAM: PORTABLE CHEST 1 VIEW COMPARISON:  PA and lateral chest 11/26/2006. FINDINGS: The patient has a large left pleural effusion with associated airspace disease. The right lung is clear. Cardiac silhouette is largely obscured. No acute bony abnormality is identified. Postoperative change left acromioclavicular joint is seen. Severe degenerative disease about the left glenohumeral joint is noted. IMPRESSION: Large left pleural effusion and airspace disease. Electronically Signed   By: Inge Rise M.D.   On: 06/11/2017 07:47    Scheduled Meds: . aspirin EC  81 mg Oral Daily  . atorvastatin  10 mg Oral q1800  . chlorhexidine  15 mL Mouth Rinse BID  . citalopram  40 mg Oral Daily  . imipramine  100 mg Oral QHS  . latanoprost  1 drop Both Eyes QHS  . LORazepam  1 mg Oral QHS  . losartan  100 mg Oral Daily  . mouth rinse  15 mL Mouth Rinse q12n4p  . metoprolol tartrate  12.5 mg Oral BID  . traZODone  200 mg Oral QHS   Continuous Infusions: . sodium chloride 100 mL/hr at 06/17/2017 1128  . azithromycin 500 mg (06/03/17 0814)  . cefTRIAXone (ROCEPHIN)  IV    . heparin 1,000 Units/hr (06/28/2017 2024)    Principal Problem:   Acute respiratory failure (HCC) Active Problems:   Hyperlipidemia   Hypertension   NSTEMI (non-ST elevated myocardial infarction) ([place])   Pleural effusion   CAP (community acquired pneumonia)   Counseling regarding end of life decision making   Acute renal  disease    Time spent: [facility] Pager AMIONIf 7PM-7AM, please contact night-coverage at [email or web address], password [facility] 06/03/2017, 11:12 AM  LOS: 1 day

## 2017-06-03 NOTE — Procedures (Signed)
Pt taken off Bipap at this time.  Pt placed on 4L [place], tolerating well.  Pt states she feels comfortable and does not feel in any distress to breathe at this time.  RT will monitor.

## 2017-06-03 NOTE — Progress Notes (Signed)
[facility] hospital[facility] update note  Chest tube placed by pulmonary team.  Assistance much appreciated. We did see patient at the bedside 2 L of bloody fluid out so far.  Discontinue orders for heparin and aspirin.  Discussed transfusion with patient.  She is agreeable.  Discussed with son who is power of attorney who also agrees.  Type and screen order placed.  Checking hemoglobin to see him blood is needed.  SCDs ordered. Discussed the findings of the chest CT with the patient and her son. Chest x-ray for the morning ordered.  Elwin Mocha MD

## 2017-06-03 NOTE — Consult Note (Signed)
Name: Stephanie Dickerson MRN: [id number] DOB: 05-31-1923    ADMISSION DATE:  06/06/2017 CONSULTATION DATE: June 03, 2017  REFERRING MD : Hospitalist service  CHIEF COMPLAINT: Shortness of breath tachypnea pleural effusion  BRIEF PATIENT DESCRIPTION: Left-sided massive pleural effusion with acute respiratory failure  SIFICANT EVENTS  Start heparin pending CAT scan  STUDIES:  Pending CAT scan   HISTORY OF PRESENT ILLNESS: 84 year old [place] female who is here because of acute respiratory failure shortness of breath she lives with her son she has been active until 6 months ago she had a respiratory illness came into the hospital with acute shortness of breath she had to go on BiPAP she had non-STEMI she is on heparin that has been stopped pending CAT scan patient had no fever she is being covered for antibiotics.  History was obtained by the son as the patient is not able to give any history she is completely ordered dependent on the BiPAP without BiPAP she drops her O2 saturation 75% she had no history of trauma recently.  PAST MEDICAL HISTORY :   has a past medical history of Anxiety/Insomnia - followed by Debbe Bales, psych nurse (02/02/2012), Cancer [facility]), Chicken pox, Depression, Glaucoma, Hyperlipidemia, Hypertension, Osteoarthritis - followed by Dr. Lawana Chambers in Ortho (02/02/2012), and Osteopenia.  has a past surgical history that includes Tonsillectomy and adenoidectomy; rotator cuff surgery (2011); arthroscopy knee surgery; cataract surgery; Breast surgery; Spine surgery; Vitrectomy (Right, 12/17/2013); Pars plana vitrectomy (Right, 12/17/2013); and Laser photo ablation (Right, 12/17/2013). Prior to Admission medications   Medication Sig Start Date End Date Taking? Authorizing Provider  acetaminophen (TYLENOL) 500 MG tablet Take 500-1,000 mg by mouth every 6 (six) hours as needed for mild pain.   Yes [provider]  amLODipine (NORVASC) 10 MG tablet TAKE ONE TABLET BY MOUTH  DAILY   -- NEEDS APPOINTMENT FOR PHYSICAL EXAM FOR REFILLS 01/03/17  Yes Lucretia Kern, DO  aspirin 81 MG tablet Take 81 mg by mouth daily.   Yes [provider]  atorvastatin (LIPITOR) 80 MG tablet TAKE 1/2 TABLETS (40 MG TOTAL) BY MOUTH DAILY. 03/30/17  Yes Colin Benton R, DO  celecoxib (CELEBREX) 200 MG capsule Take 200 mg by mouth daily.   Yes [provider]  citalopram (CELEXA) 40 MG tablet Take 40 mg by mouth daily.   Yes [provider]  GLUCOSAMINE-CHONDROITIN PO Take 1 tablet by mouth every morning.    Yes [provider]  imipramine (TOFRANIL) 50 MG tablet Take 100 mg by mouth at bedtime.    Yes [provider]  LORazepam (ATIVAN) 1 MG tablet Take 1 mg by mouth at bedtime.    Yes [provider]  losartan (COZAAR) 100 MG tablet TAKE 1 TABLET (100 MG TOTAL) BY MOUTH DAILY. 03/30/17  Yes Lucretia Kern, DO  methocarbamol (ROBAXIN) 500 MG tablet Take 500 mg by mouth daily as needed for muscle spasms.    Yes [provider]  naproxen sodium (ALEVE) 220 MG tablet Take 220 mg by mouth daily as needed.   Yes [provider]  Polyethylene Glycol 3350 (MIRALAX PO) Take 1 Applicatorful by mouth daily as needed (constipation).    Yes [provider]  Travoprost, BAK Free, (TRAVATAN Z) 0.004 % SOLN ophthalmic solution Place 1 drop into both eyes at bedtime.   Yes [provider]  traZODone (DESYREL) 100 MG tablet Take 200 mg by mouth at bedtime.    Yes [provider]   Allergies  Allergen Reactions  . Lactose Intolerance (Gi) Diarrhea    FAMILY HISTORY:  family history includes Bone cancer in her mother; Leukemia in her father; Lung cancer in her unknown relative. SOCIAL HISTORY:  reports that she has quit smoking. Her smoking use included cigarettes. she has never used smokeless tobacco. She reports that she drinks alcohol. She reports that she does not use drugs.  REVIEW OF SYSTEMS:     Constitutional: Negative for fever, chills, weight loss, malaise/fatigue and diaphoresis.  HENT: Negative for hearing loss, ear pain, nosebleeds, congestion, sore throat, neck pain, tinnitus and ear discharge.   Eyes: Negative for blurred vision, double vision, photophobia, pain, discharge and redness.  Respiratory: Negative for cough, hemoptysis, sputum production, shortness of breath, wheezing and stridor.   Cardiovascular: Negative for chest pain, palpitations, orthopnea, claudication, leg swelling and PND.  Gastrointestinal: Negative for heartburn, nausea, vomiting, abdominal pain, diarrhea, constipation, blood in stool and melena.  Genitourinary: Negative for dysuria, urgency, frequency, hematuria and flank pain.  Musculoskeletal: Negative for myalgias, back pain, joint pain and falls.  Skin: Negative for itching and rash.  Neurological: Negative for dizziness, tingling, tremors, sensory change, speech change, focal weakness, seizures, loss of consciousness, weakness and headaches.  Endo/Heme/Allergies: Negative for environmental allergies and polydipsia. Does not bruise/bleed easily.  SUBJECTIVE:   VITAL SIGNS: Temp:  [98.1 F (36.7 C)-99.3 F (37.4 C)] 98.3 F (36.8 C) (02/02 0417) Pulse Rate:  [57-108] 76 (02/02 0900) Resp:  [18-27] 27 (02/02 0900) BP: (101-123)/(47-74) 102/62 (02/02 0417) SpO2:  [96 %-99 %] 97 % (02/02 0900) FiO2 (%):  [40 %] 40 % (02/01 1551) Weight:  [183 lb 13.8 oz (83.4 kg)] 183 lb 13.8 oz (83.4 kg) (02/02 0448)  PHYSICAL EXAMINATION: Gen: Elderly female lying in bed at 30 with BiPAP on face with moderate seal with respiratory rate around 24 unlabored. Patient able to answer yes no questions without difficulty. Eyes: Sclerae anicteric. Conjunctiva mildly injected. Chest: Difficult to assess given mechanical breath sounds throughout.  CV: Distant, regular, no audible murmurs. Abdomen: NABS, obese, soft, nondistended, nontender. No tenderness to light or  deep palpation. Extremities: No edema.  Skin: Warm and dry. No rashes, lesions or wounds. Neuro: Alert and oriented times 3, able to move all 4 extremities without difficulty. Psych: Patient is on BiPAP and nods yes that she understands what's going on and agrees to plan as I have described.    Recent Labs  Lab 06/06/2017 0710 06/03/17 0246  NA 142 141  K 4.5 5.0  CL 108 111  CO2 20* 22  BUN 38* 38*  CREATININE 1.64* 1.65*  GLUCOSE 146* 98   Recent Labs  Lab 06/14/2017 0710 06/03/17 0246  HGB 10.2* 9.1*  HCT 32.4* 29.2*  WBC 21.3* 15.1*  PLT 247 227   Dg Chest Port 1 View  Result Date: 06/03/2017 CLINICAL DATA:  Pleural effusion EXAM: PORTABLE CHEST 1 VIEW COMPARISON:  06/28/2017 FINDINGS: Moderate to large left pleural effusion. Left mid/lower lung is obscured with suspected compressive atelectasis. Right lung is essentially clear. Cardiomediastinal silhouette is obscured. No pneumothorax. IMPRESSION: Moderate to large left pleural effusion, grossly unchanged. Electronically Signed   By: Julian Hy M.D.   On: 06/03/2017 09:29   Dg Chest Portable 1 View  Result Date: 06/29/2017 CLINICAL DATA:  Labored breathing today. EXAM: PORTABLE CHEST 1 VIEW COMPARISON:  PA and lateral chest 11/26/2006. FINDINGS: The patient has a large left pleural effusion with associated airspace disease. The right lung is clear. Cardiac silhouette  is largely obscured. No acute bony abnormality is identified. Postoperative change left acromioclavicular joint is seen. Severe degenerative disease about the left glenohumeral joint is noted. IMPRESSION: Large left pleural effusion and airspace disease. Electronically Signed   By: Inge Rise M.D.   On: 06/09/2017 07:47    ASSESSMENT / PLAN:  --Acute hypoxemic respiratory failure seem to be related to the massive pleural effusion this point continue with BiPAP CAT scans pending.  --Possible to community-acquired pneumonia patient is being managed  with antibiotic appropriate coverage. -Non-STEMI stop heparin for possible procedures. CAT scans pending we will send the fluid if obtained for cytology given history of breast cancer. -Given the left-sided pleural effusion is very unlikely due to cardiac function but it is not impossible at this point we will plan to do a CAT scan plan plan to type the patient and put a chest tube after the half life of heparin is gone and the patient gets a CAT scan could happen today could happen tomorrow thank you for this consultation please call us with any questions.  Pulmonary and [place] Pager: [phone]  06/03/2017, 11:05 AM

## 2017-06-03 NOTE — Procedures (Signed)
Pt placed back on Bipap at this time.  Pt states becoming more difficult to breathe.  Pt does seem to be labored.  Pt tolerating Bipap well, RT will monitor

## 2017-06-03 NOTE — Progress Notes (Addendum)
Pt noted to have not voided throughout the night. Reportedly voided x1 on day shift. NS 16ml/hr. Pt denies urge to void, bladder not distended. Initial bladder scan at 0330 showed 222 mLs. Rescan at 0620 revealed 228 mLs. Pt still denies urge to void. Will pass along to day shift. Pt denies other needs at this time.

## 2017-06-03 NOTE — Progress Notes (Signed)
ANTICOAGULATION CONSULT NOTE - Follow Up Consult  Pharmacy Consult for heparin Indication: chest pain/ACS  Allergies  Allergen Reactions  . Lactose Intolerance (Gi) Diarrhea    Patient Measurements: Height: 5\' 3"  (160 cm)(patient reported) Weight: 183 lb 13.8 oz (83.4 kg)(taken w/ bipap mask, extra blankets) IBW/kg (Calculated) : 52.4 Heparin Dosing Weight: 70 kg  Vital Signs: Temp: 98.3 F (36.8 C) (02/02 0417) Temp Source: Axillary (02/02 0417) BP: 102/62 (02/02 0417) Pulse Rate: 76 (02/02 0900)  Labs: Recent Labs    06/10/2017 0710 06/20/2017 1053 06/09/2017 1741 06/02/2017 2251 06/03/17 0246  HGB 10.2*  --   --   --  9.1*  HCT 32.4*  --   --   --  29.2*  PLT 247  --   --   --  227  LABPROT  --   --   --   --  15.0  INR  --   --   --   --  1.19  HEPARINUNFRC  --   --  0.14*  --  0.58  CREATININE 1.64*  --   --   --  1.65*  TROPONINI  --  1.56* 1.38* 1.57*  --     Estimated Creatinine Clearance: 22.7 mL/min (A) (by C-G formula based on SCr of 1.65 mg/dL (H)).   Medications:  Heparin @ 1000 units/hr  Assessment: 84 year old female presented to ED with SOB and poor appetite. Troponin elevated and cardiology consulted. She was started on heparin. Heparin level is therapeutic at 0.58. Hgb low but stable, platelets ok. No bleeding.  Goal of Therapy:  Heparin level 0.3-0.7 units/ml Monitor platelets by anticoagulation protocol: Yes   Plan:  1) Continue heparin at 1000 units/hr 2) Daily heparin level and CBC   Nena Jordan, PharmD, BCPS 06/03/2017 10:43 AM

## 2017-06-03 NOTE — Procedures (Signed)
Pt transported to CT and back to 4E09 on Bipap, no complications

## 2017-06-03 NOTE — Procedures (Signed)
Procedures left-sided chest tube   Indication acute hypoxemic respiratory failure with large sided left pleural effusion   Consent was obtained by the son  CAT scan was reviewed by myself   Area was draped and prepped with sterile fashion numbed with 1% lidocaine   Needle was introduced through the fifth intercostal space mid axillary line with return of fluid catheter was introduced over Seldinger technique wire 14 French pigtail catheter was introduced with return of bloody amber color fluid connected to chest tube chamber collection of 700 cc immediately no complications to report chest x-ray is pending cytology for cell count culture LDH was sent.

## 2017-06-04 ENCOUNTER — Inpatient Hospital Stay (HOSPITAL_COMMUNITY): Payer: Medicare Other

## 2017-06-04 DIAGNOSIS — M8580 Other specified disorders of bone density and structure, unspecified site: Secondary | ICD-10-CM

## 2017-06-04 DIAGNOSIS — R160 Hepatomegaly, not elsewhere classified: Secondary | ICD-10-CM

## 2017-06-04 DIAGNOSIS — Z808 Family history of malignant neoplasm of other organs or systems: Secondary | ICD-10-CM

## 2017-06-04 DIAGNOSIS — I249 Acute ischemic heart disease, unspecified: Secondary | ICD-10-CM

## 2017-06-04 DIAGNOSIS — E041 Nontoxic single thyroid nodule: Secondary | ICD-10-CM

## 2017-06-04 DIAGNOSIS — J069 Acute upper respiratory infection, unspecified: Secondary | ICD-10-CM

## 2017-06-04 DIAGNOSIS — Z806 Family history of leukemia: Secondary | ICD-10-CM

## 2017-06-04 DIAGNOSIS — J9601 Acute respiratory failure with hypoxia: Secondary | ICD-10-CM

## 2017-06-04 DIAGNOSIS — C787 Secondary malignant neoplasm of liver and intrahepatic bile duct: Secondary | ICD-10-CM

## 2017-06-04 DIAGNOSIS — S76811S Strain of other specified muscles, fascia and tendons at thigh level, right thigh, sequela: Secondary | ICD-10-CM

## 2017-06-04 DIAGNOSIS — F329 Major depressive disorder, single episode, unspecified: Secondary | ICD-10-CM

## 2017-06-04 DIAGNOSIS — G4700 Insomnia, unspecified: Secondary | ICD-10-CM

## 2017-06-04 DIAGNOSIS — J90 Pleural effusion, not elsewhere classified: Secondary | ICD-10-CM

## 2017-06-04 DIAGNOSIS — E785 Hyperlipidemia, unspecified: Secondary | ICD-10-CM

## 2017-06-04 DIAGNOSIS — I10 Essential (primary) hypertension: Secondary | ICD-10-CM

## 2017-06-04 DIAGNOSIS — M1990 Unspecified osteoarthritis, unspecified site: Secondary | ICD-10-CM

## 2017-06-04 DIAGNOSIS — C3411 Malignant neoplasm of upper lobe, right bronchus or lung: Secondary | ICD-10-CM

## 2017-06-04 DIAGNOSIS — J189 Pneumonia, unspecified organism: Secondary | ICD-10-CM

## 2017-06-04 DIAGNOSIS — F419 Anxiety disorder, unspecified: Secondary | ICD-10-CM

## 2017-06-04 DIAGNOSIS — Z79899 Other long term (current) drug therapy: Secondary | ICD-10-CM

## 2017-06-04 DIAGNOSIS — Z87891 Personal history of nicotine dependence: Secondary | ICD-10-CM

## 2017-06-04 DIAGNOSIS — Z853 Personal history of malignant neoplasm of breast: Secondary | ICD-10-CM

## 2017-06-04 LAB — CBC
HCT: 27.4 % — ABNORMAL LOW (ref 36.0–46.0)
Hemoglobin: 8.5 g/dL — ABNORMAL LOW (ref 12.0–15.0)
MCH: 29.8 pg (ref 26.0–34.0)
MCHC: 31 g/dL (ref 30.0–36.0)
MCV: 96.1 fL (ref 78.0–100.0)
PLATELETS: 195 10*3/uL (ref 150–400)
RBC: 2.85 MIL/uL — ABNORMAL LOW (ref 3.87–5.11)
RDW: 16.7 % — AB (ref 11.5–15.5)
WBC: 14 10*3/uL — ABNORMAL HIGH (ref 4.0–10.5)

## 2017-06-04 LAB — BASIC METABOLIC PANEL
Anion gap: 8 (ref 5–15)
BUN: 40 mg/dL — ABNORMAL HIGH (ref 6–20)
CALCIUM: 8 mg/dL — AB (ref 8.9–10.3)
CO2: 20 mmol/L — ABNORMAL LOW (ref 22–32)
CREATININE: 1.57 mg/dL — AB (ref 0.44–1.00)
Chloride: 114 mmol/L — ABNORMAL HIGH (ref 101–111)
GFR, EST AFRICAN AMERICAN: 32 mL/min — AB (ref >60)
GFR, EST NON AFRICAN AMERICAN: 28 mL/min — AB (ref >60)
Glucose, Bld: 82 mg/dL (ref 65–99)
Potassium: 5 mmol/L (ref 3.5–5.1)
Sodium: 142 mmol/L (ref 135–145)

## 2017-06-04 LAB — LD, BODY FLUID (OTHER): LD, Body Fluid: 328 IU/L

## 2017-06-04 NOTE — Progress Notes (Signed)
Name: Stephanie Dickerson MRN: [id number] DOB: [birth year]    ADMISSION DATE:  06/23/2017 CONSULTATION DATE: June 03, 2017  REFERRING MD : Hospitalist service  CHIEF COMPLAINT: Shortness of breath tachypnea pleural effusion  BRIEF PATIENT DESCRIPTION: Left-sided massive pleural effusion with acute respiratory failure    SIGNIFICANT EVENTS  - L chest tube place 2/2     STUDIES: - BC x 2 6/1 >> - L Pleural effusion 2/2 :   Serosanguinous, Exudate by LDH 328 / WBC 205  With P > L. cyt pening     HISTORY OF PRESENT ILLNESS: 89-yowf remote smoker from [place]  who is here because of acute respiratory failure shortness of breath she lives with her son she has been active until 6 months ago  And around 2 weeks PA developed uri symptoms with proressive sob so  came into the ER where placed on on BiPAP she with dx non-STEMI  And placed  on heparin and CCM service consulted on a large L effusion.     SUBJECTIVE:  Much more comfortable s/ p L chest tube placement with 2250 out since placement    Intake/Output Summary (Last 24 hours) at 06/04/2017 1339 Last data filed at 06/04/2017 0600 Gross per 24 hour  Intake 2363.33 ml  Output 2500 ml  Net -136.67 ml     VITAL SIGNS: Temp:  [97.6 F (36.4 C)-98.7 F (37.1 C)] 98.7 F (37.1 C) (02/03 0400) Pulse Rate:  [58-111] 89 (02/03 0801) Resp:  [15-24] 20 (02/03 0559) BP: (106-115)/(47-56) 106/56 (02/03 0801) SpO2:  [94 %-100 %] 96 % (02/03 1121) FiO2 (%):  [40 %] 40 % (02/03 0400) Weight:  [177 lb 0.5 oz (80.3 kg)] 177 lb 0.5 oz (80.3 kg) (02/03 0559)    PHYSICAL EXAMINATION: Elderly wf nad on NP x 3lpm   Pt alert, approp nad @ 45 degrees  No jvd Oropharynx clear Neck supple Lungs clear bilaterally to A and P  RRR no s3 or or sign murmur Abd obese with nl  excursion  Extr wam with no edema or clubbing noted Neuro  No motor deficits     Recent Labs  Lab 06/26/2017 0710 06/03/17 0246 06/04/17 0305  NA 142 141 142    K 4.5 5.0 5.0  CL 108 111 114*  CO2 20* 22 20*  BUN 38* 38* 40*  CREATININE 1.64* 1.65* 1.57*  GLUCOSE 146* 98 82   Recent Labs  Lab 06/03/17 0246 06/03/17 1900 06/04/17 0305  HGB 9.1* 9.1* 8.5*  HCT 29.2* 29.3* 27.4*  WBC 15.1* 15.3* 14.0*  PLT 227 215 195   Ct Chest Wo Contrast  Result Date: 06/03/2017 CLINICAL DATA:  Moderate to large left pleural effusion on a portable chest earlier today. EXAM: CT CHEST WITHOUT CONTRAST TECHNIQUE: Multidetector CT imaging of the chest was performed following the standard protocol without IV contrast. COMPARISON:  Portable chest obtained earlier today. Chest CT report dated 03/15/2001. FINDINGS: Cardiovascular: Atheromatous calcifications, including the coronary arteries and aorta. Normal sized heart. Enlarged central pulmonary arteries. The right main pulmonary artery has a maximum diameter of 3.1 cm and the left main pulmonary artery has a maximum diameter of 2.5 cm. The main pulmonary artery segment has a diameter of 2.9 cm. Mediastinum/Nodes: Mildly lobulated thyroid contours with mild enlargement of the right lobe and a 7 mm low density nodule in the posterior aspect of the right lobe. There is also a hyperdense nodule arising from the inferior aspect of the thyroid gland at  the isthmus on the left, measuring 1.4 cm in maximum diameter on image number 29 of series 3. Mildly prominent low density subcarinal node and similar-appearing adjacent right hilar node. The subcarinal node has a short axis diameter 9 mm on image number 75 of series 3 in the hilar node has a short axis diameter of 9 mm on image number 79 of series 3. Lungs/Pleura: Again demonstrated is a moderate to large-sized left pleural effusion with adjacent atelectasis in the left upper lobe and left lower lobe. The atelectatic left lower lobe contains multiple calcific densities and there are small punctate calcific densities in the atelectatic left upper lobe. The bronchial walls are  calcified and no endobronchial masses are identified. There is a rind of soft tissue thickening at the left lung apex anteriorly and medially, measuring 2.3 cm in thickness on image number 27 of series 3. Triangular-shaped area of focal, dense lung consolidation in the posterolateral aspect of the right middle lobe. Small amount of linear density in the right upper lobe. There is also a focal area of ground-glass interstitial opacity in the posterior aspect of the right upper lobe, measuring 3.9 x 2.3 cm on image number 28 of series 4. Upper Abdomen: Poorly defined right lobe liver mass measuring approximately 4.8 x 4.2 cm on image number 118 of series 3. The spleen is displaced medially and anteriorly by the left pleural fluid. No free peritoneal fluid is seen. Musculoskeletal: Extensive thoracic spine degenerative changes. Bilateral shoulder degenerative changes. IMPRESSION: 1. Thick rind of soft tissue at the left lung apex anteriorly and medially, suspicious for pleural based malignancy. 2. Moderate to large left pleural effusion with mass effect on the upper abdomen, suspicious for a malignant effusion. 3. Compressive atelectasis of the left upper lobe and left lower lobe. 4. 3.9 x 2.3 cm focal ground-glass opacity in the right upper lobe. This could be infectious, inflammatory or neoplastic in nature. 5. Focal atelectasis common pneumonia or pulmonary infarction in the posterolateral aspect of the right middle lobe. 6. Mild mediastinal and right hilar adenopathy. This could represent metastatic or reactive adenopathy. 7. Prominent central pulmonary arteries, mainly involving the right main pulmonary artery. This can be seen with pulmonary arterial hypertension. 8. Poorly defined right lobe liver mass measuring approximately 4.8 x 4.2 cm. This is suspicious for a metastasis. A primary liver neoplasm is also a possibility. 9. 1.4 cm hyperdense exophytic nodule arising from the isthmus of the thyroid gland on the  left. Consider further evaluation with thyroid ultrasound. If patient is clinically hyperthyroid, consider nuclear medicine thyroid uptake and scan. 10.  Calcific coronary artery and aortic atherosclerosis. Aortic Atherosclerosis (ICD10-I70.0). Electronically Signed   By: Claudie Revering M.D.   On: 06/03/2017 15:00   Dg Chest Port 1 View  Result Date: 06/04/2017 CLINICAL DATA:  Pneumothorax. EXAM: PORTABLE CHEST 1 VIEW COMPARISON:  06/03/2017 FINDINGS: Left pigtail pleural catheter remains in place. The cardiomediastinal silhouette is unchanged. No definite residual left pneumothorax is identified. There is a persistent small left pleural effusion, and there is pleural thickening at the lung apex. Right lateral costophrenic angle blunting is unchanged without evidence of a sizable pleural effusion. Patchy left basilar airspace opacity is unchanged. The right lung remains clear. IMPRESSION: 1. No residual pneumothorax identified. 2. Unchanged small left pleural effusion and left basilar atelectasis. Electronically Signed   By: Logan Bores M.D.   On: 06/04/2017 10:03   Dg Chest Port 1 View  Result Date: 06/03/2017 CLINICAL DATA:  Chest  tube placement for large left pleural effusion. EXAM: PORTABLE CHEST 1 VIEW 5:12 p.m.: COMPARISON:  CT chest and portable chest x-ray earlier same day. FINDINGS: Pigtail catheter placed into the left pleural space with marked improvement in the left pleural effusion since earlier today. There is a small (approximate 5-10%) left apicolateral pneumothorax. A small to moderate-sized effusion persists at the base of the left hemithorax, with associated consolidation involving the left lower lobe. Right lung remains clear with mild pleuroparenchymal scarring at the right base accounting for the blunted costophrenic angle as demonstrated on the CT earlier today. IMPRESSION: 1. Marked reduction in size of the left pleural effusion after chest tube placement. A small (approximate 5-10%)  left apicolateral pneumothorax is present. 2. Residual small to moderate-sized left pleural effusion with associated passive atelectasis and/or pneumonia involving the left lower lobe. These results will be called to the ordering clinician or representative by the Radiologist Assistant, and communication documented in the PACS or zVision Dashboard. Electronically Signed   By: Evangeline Dakin M.D.   On: 06/03/2017 17:57   Dg Chest Port 1 View  Result Date: 06/03/2017 CLINICAL DATA:  Pleural effusion EXAM: PORTABLE CHEST 1 VIEW COMPARISON:  06/14/2017 FINDINGS: Moderate to large left pleural effusion. Left mid/lower lung is obscured with suspected compressive atelectasis. Right lung is essentially clear. Cardiomediastinal silhouette is obscured. No pneumothorax. IMPRESSION: Moderate to large left pleural effusion, grossly unchanged. Electronically Signed   By: Julian Hy M.D.   On: 06/03/2017 09:29    ASSESSMENT / PLAN:    1)  Acute respiratory failure secondary to large L effusion, improved since CT placement 06/04/27 - d/c bipap/ wean 02   2) very large slt bloody effusion with exudative features / ? Parapneumonic?  Malignant (cyb pending)  - await cytology on effusion   3)  GG changes RUL ? Infection vs Ca - no assoc cough  - Roc/ zmax since 2/1  - quant TB sent pm 2/3  - continue rx with zmax/ roc for CAP pending cultures     Christinia Gully, MD [facility] and [place] [phone] After 5:30 PM or weekends, use Beeper [phone]

## 2017-06-04 NOTE — Consult Note (Deleted)
Name: Stephanie Dickerson MRN: [id number] DOB: 04-Jan-1924    ADMISSION DATE:  06/06/2017 CONSULTATION DATE: June 03, 2017  REFERRING MD : Hospitalist service  CHIEF COMPLAINT: Shortness of breath tachypnea pleural effusion  BRIEF PATIENT DESCRIPTION: Left-sided massive pleural effusion with acute respiratory failure    SIGNIFICANT EVENTS  - L chest tube place 2/2     STUDIES: - BC x 2 6/1 >> - L Pleural effusion 2/2 :   Serosanguinous, Exudate by LDH 328 / WBC 205  With P > L. cyt pening     HISTORY OF PRESENT ILLNESS: 89-yowf remote smoker from [place]  who is here because of acute respiratory failure shortness of breath she lives with her son she has been active until 6 months ago  And around 2 weeks PA developed uri symptoms with proressive sob so  came into the ER where placed on on BiPAP she with dx non-STEMI  And placed  on heparin and CCM service consulted on a large L effusion.     SUBJECTIVE:  Much more comfortable s/ p L chest tube placement with 2250 out since placement    Intake/Output Summary (Last 24 hours) at 06/04/2017 1322 Last data filed at 06/04/2017 0600 Gross per 24 hour  Intake 2363.33 ml  Output 2500 ml  Net -136.67 ml     VITAL SIGNS: Temp:  [97.6 F (36.4 C)-98.7 F (37.1 C)] 98.7 F (37.1 C) (02/03 0400) Pulse Rate:  [58-111] 89 (02/03 0801) Resp:  [15-24] 20 (02/03 0559) BP: (106-115)/(47-56) 106/56 (02/03 0801) SpO2:  [94 %-100 %] 98 % (02/03 0757) FiO2 (%):  [40 %] 40 % (02/03 0400) Weight:  [177 lb 0.5 oz (80.3 kg)] 177 lb 0.5 oz (80.3 kg) (02/03 0559)    PHYSICAL EXAMINATION: Elderly wf nad on NP x 3lpm   Pt alert, approp nad @ 45 degrees  No jvd Oropharynx clear Neck supple Lungs clear bilaterally to A and P  RRR no s3 or or sign murmur Abd obese with nl  excursion  Extr wam with no edema or clubbing noted Neuro  No motor deficits     Recent Labs  Lab 06/15/2017 0710 06/03/17 0246 06/04/17 0305  NA 142 141 142    K 4.5 5.0 5.0  CL 108 111 114*  CO2 20* 22 20*  BUN 38* 38* 40*  CREATININE 1.64* 1.65* 1.57*  GLUCOSE 146* 98 82   Recent Labs  Lab 06/03/17 0246 06/03/17 1900 06/04/17 0305  HGB 9.1* 9.1* 8.5*  HCT 29.2* 29.3* 27.4*  WBC 15.1* 15.3* 14.0*  PLT 227 215 195   Ct Chest Wo Contrast  Result Date: 06/03/2017 CLINICAL DATA:  Moderate to large left pleural effusion on a portable chest earlier today. EXAM: CT CHEST WITHOUT CONTRAST TECHNIQUE: Multidetector CT imaging of the chest was performed following the standard protocol without IV contrast. COMPARISON:  Portable chest obtained earlier today. Chest CT report dated 03/15/2001. FINDINGS: Cardiovascular: Atheromatous calcifications, including the coronary arteries and aorta. Normal sized heart. Enlarged central pulmonary arteries. The right main pulmonary artery has a maximum diameter of 3.1 cm and the left main pulmonary artery has a maximum diameter of 2.5 cm. The main pulmonary artery segment has a diameter of 2.9 cm. Mediastinum/Nodes: Mildly lobulated thyroid contours with mild enlargement of the right lobe and a 7 mm low density nodule in the posterior aspect of the right lobe. There is also a hyperdense nodule arising from the inferior aspect of the thyroid gland at  the isthmus on the left, measuring 1.4 cm in maximum diameter on image number 29 of series 3. Mildly prominent low density subcarinal node and similar-appearing adjacent right hilar node. The subcarinal node has a short axis diameter 9 mm on image number 75 of series 3 in the hilar node has a short axis diameter of 9 mm on image number 79 of series 3. Lungs/Pleura: Again demonstrated is a moderate to large-sized left pleural effusion with adjacent atelectasis in the left upper lobe and left lower lobe. The atelectatic left lower lobe contains multiple calcific densities and there are small punctate calcific densities in the atelectatic left upper lobe. The bronchial walls are  calcified and no endobronchial masses are identified. There is a rind of soft tissue thickening at the left lung apex anteriorly and medially, measuring 2.3 cm in thickness on image number 27 of series 3. Triangular-shaped area of focal, dense lung consolidation in the posterolateral aspect of the right middle lobe. Small amount of linear density in the right upper lobe. There is also a focal area of ground-glass interstitial opacity in the posterior aspect of the right upper lobe, measuring 3.9 x 2.3 cm on image number 28 of series 4. Upper Abdomen: Poorly defined right lobe liver mass measuring approximately 4.8 x 4.2 cm on image number 118 of series 3. The spleen is displaced medially and anteriorly by the left pleural fluid. No free peritoneal fluid is seen. Musculoskeletal: Extensive thoracic spine degenerative changes. Bilateral shoulder degenerative changes. IMPRESSION: 1. Thick rind of soft tissue at the left lung apex anteriorly and medially, suspicious for pleural based malignancy. 2. Moderate to large left pleural effusion with mass effect on the upper abdomen, suspicious for a malignant effusion. 3. Compressive atelectasis of the left upper lobe and left lower lobe. 4. 3.9 x 2.3 cm focal ground-glass opacity in the right upper lobe. This could be infectious, inflammatory or neoplastic in nature. 5. Focal atelectasis common pneumonia or pulmonary infarction in the posterolateral aspect of the right middle lobe. 6. Mild mediastinal and right hilar adenopathy. This could represent metastatic or reactive adenopathy. 7. Prominent central pulmonary arteries, mainly involving the right main pulmonary artery. This can be seen with pulmonary arterial hypertension. 8. Poorly defined right lobe liver mass measuring approximately 4.8 x 4.2 cm. This is suspicious for a metastasis. A primary liver neoplasm is also a possibility. 9. 1.4 cm hyperdense exophytic nodule arising from the isthmus of the thyroid gland on the  left. Consider further evaluation with thyroid ultrasound. If patient is clinically hyperthyroid, consider nuclear medicine thyroid uptake and scan. 10.  Calcific coronary artery and aortic atherosclerosis. Aortic Atherosclerosis (ICD10-I70.0). Electronically Signed   By: Claudie Revering M.D.   On: 06/03/2017 15:00   Dg Chest Port 1 View  Result Date: 06/04/2017 CLINICAL DATA:  Pneumothorax. EXAM: PORTABLE CHEST 1 VIEW COMPARISON:  06/03/2017 FINDINGS: Left pigtail pleural catheter remains in place. The cardiomediastinal silhouette is unchanged. No definite residual left pneumothorax is identified. There is a persistent small left pleural effusion, and there is pleural thickening at the lung apex. Right lateral costophrenic angle blunting is unchanged without evidence of a sizable pleural effusion. Patchy left basilar airspace opacity is unchanged. The right lung remains clear. IMPRESSION: 1. No residual pneumothorax identified. 2. Unchanged small left pleural effusion and left basilar atelectasis. Electronically Signed   By: Logan Bores M.D.   On: 06/04/2017 10:03   Dg Chest Port 1 View  Result Date: 06/03/2017 CLINICAL DATA:  Chest  tube placement for large left pleural effusion. EXAM: PORTABLE CHEST 1 VIEW 5:12 p.m.: COMPARISON:  CT chest and portable chest x-ray earlier same day. FINDINGS: Pigtail catheter placed into the left pleural space with marked improvement in the left pleural effusion since earlier today. There is a small (approximate 5-10%) left apicolateral pneumothorax. A small to moderate-sized effusion persists at the base of the left hemithorax, with associated consolidation involving the left lower lobe. Right lung remains clear with mild pleuroparenchymal scarring at the right base accounting for the blunted costophrenic angle as demonstrated on the CT earlier today. IMPRESSION: 1. Marked reduction in size of the left pleural effusion after chest tube placement. A small (approximate 5-10%)  left apicolateral pneumothorax is present. 2. Residual small to moderate-sized left pleural effusion with associated passive atelectasis and/or pneumonia involving the left lower lobe. These results will be called to the ordering clinician or representative by the Radiologist Assistant, and communication documented in the PACS or zVision Dashboard. Electronically Signed   By: Evangeline Dakin M.D.   On: 06/03/2017 17:57   Dg Chest Port 1 View  Result Date: 06/03/2017 CLINICAL DATA:  Pleural effusion EXAM: PORTABLE CHEST 1 VIEW COMPARISON:  06/29/2017 FINDINGS: Moderate to large left pleural effusion. Left mid/lower lung is obscured with suspected compressive atelectasis. Right lung is essentially clear. Cardiomediastinal silhouette is obscured. No pneumothorax. IMPRESSION: Moderate to large left pleural effusion, grossly unchanged. Electronically Signed   By: Julian Hy M.D.   On: 06/03/2017 09:29    ASSESSMENT / PLAN:    1)  Acute respiratory failure secondary to large L effusion, improved since CT placement 06/04/27 - d/c bipap/ wean 02   2) very large slt bloody effusion with exudative features / ? Parapneumonic?  Malignant (cyb pending)  - await cytology on effusion   3)  GG changes RUL ? Infection vs Ca - no assoc cough  - Roc/ zmax since 2/1  - quant TB sent pm 2/3  - continue rx with zmax/ roc for CAP pending cultures     Christinia Gully, MD Pulmonary and [place] [phone] After 5:30 PM or weekends, use Beeper [phone]

## 2017-06-04 NOTE — Consult Note (Signed)
New Hematology/Oncology Consult   Referral MD: Dr Elwin Mocha     Reason for Referral: Present patient with metastatic malignancy involving lungs, pleural effusion, and liver  HPI:  Stephanie Dickerson is a 84 y.o. female with previous history of invasive ductal carcinoma of the right breast in the upper outer quadrant diagnosed in July 2009.  Tumor was  low-grade, hormone receptor positive and HER-2 negative.  Patient underwent lumpectomy with sentinel lymph node biopsy demonstrating a 1.7 cm invasive ductal carcinoma of grade 1 with low-grade DCIS and negative margins.  0/3 lymph nodes were positive for metastatic malignancy.  Patient declined adjuvant radiation therapy and received 2 years of tamoxifen followed by 3 years of Arimidex and completed her therapy in March 2016.  The initial evaluation and treatment were conducted by Dr. Burr Medico will follow the patient until August 2016 after which follow-up was transferred to her primary care providers.  Patient presented to the emergency room on 06/19/2017 brought in by her son for increasing shortness of breath.  Reviewed past medical history is positive for hypertension hyperlipidemia, and recent tear of the right hamstring approximately 6 months ago resulting in significant decline of mobility of the patient.  Prior to that event, patient was quite functional for her age but did require assistance at home with house chores.  She was still socially active and engaged with the family at that time.  Following the trauma, patient has become progressively weaker and less active with significant deconditioning and recently has developed upper respiratory tract infection approximately 2 weeks prior to presentation with progressive fatigue and development of anorexia.  Her performance status has deteriorated rapidly essentially binding her into her bedroom with exception of bathroom use.  3-4 days prior to admission, shortness of breath was noted while  ambulating and also some possible hallucinations were observed.  On the day of admission, patient was lethargic and was found to have oxygen saturation of 75% with expiratory wheezing on presentation to the emergency room.  Chest x-ray at that time revealed large left pleural effusion with atelectasis of the lung.  Patient was treated with inhaled bronchodilators and started on ceftriaxone and azithromycin.  BiPAP was required to relieve persistent increased work of breathing.  Subsequent to admission, CT of the chest without contrast was obtained demonstrating presence of suspected right upper lobe mass/interstitial opacity additionally, soft tissue thickening noted over the left lung apex inferiorly medially.  Due to persistent respiratory distress, patient underwent chest tube placement on 06/03/17 and fluid samples were submitted for cytological analysis which is currently pending.  Placement of the chest tube, although uncomfortable in itself with some hemorrhagic component to the fluid resulted in significant improvement in breathing for the patient and resolution of mental status alterations.  At the present time, patient does complain of discomfort at the chest tube insertion site, but reports comfortable breathing.  She denies any chest pain, abdominal pain, nausea, or vomiting.  Denies any new musculoskeletal complaints although she does have extensive degenerative arthritis which causes her significant pain and discomfort.  She continues to have pain over the right thigh at the site of the recent muscle injury.  Oncological History: --CT Chest, 06/03/17: Cardiovascular: Atheromatous calcifications, including the coronary arteries and aorta. Normal sized heart. Enlarged central pulmonary arteries. The right main pulmonary artery has a maximum diameter of 3.1 cm and the left main pulmonary artery has a maximum diameter of 2.5 cm. The main pulmonary artery segment has a diameter of 2.9  cm.  Mediastinum/Nodes: Mildly lobulated thyroid contours with mild enlargement of the right lobe and a 7 mm low density nodule in the posterior aspect of the right lobe. There is also a hyperdense nodule arising from the inferior aspect of the thyroid gland at the isthmus on the left, measuring 1.4 cm in maximum diameter. Mildly prominent low density subcarinal node and similar-appearing adjacent right hilar node. The subcarinal node has a short axis diameter 9 mm in the hilar node has a short axis diameter of 9 mm. Lungs/Pleura: Again demonstrated is a moderate to large-sized left pleural effusion with adjacent atelectasis in the left upper lobe and left lower lobe. he atelectatic left lower lobe contains multiple calcific densities and there are small punctate calcific densities in the atelectatic left upper lobe. The bronchial walls are calcified and no endobronchial masses are identified. There is a rind of soft tissue thickening at the left lung apex anteriorly and medially, measuring 2.3 cm in thickness. Triangular-shaped area of focal, dense lung consolidation in the posterolateral aspect of the right middle lobe. Small amount of linear density in the right upper lobe. There is also a focal area of ground-glass interstitial opacity in the posterior aspect of the right upper lobe, measuring 3.9 x 2.3 cm. Upper Abdomen: Poorly defined right lobe liver mass measuring approximately 4.8 x 4.2 cm. The spleen is displaced medially and anteriorly by the left pleural fluid. No free peritoneal fluid is seen. Musculoskeletal: Extensive thoracic spine degenerative changes. Bilateral shoulder degenerative changes.     Past Medical History:  Diagnosis Date  . Anxiety/Insomnia - followed by Debbe Bales, psych nurse 02/02/2012  . Cancer ([place])    Breast cancer-Right  . Chicken pox   . Depression   . Glaucoma   . Hyperlipidemia   . Hypertension   . Osteoarthritis - followed by Dr. Lawana Chambers in Ortho 02/02/2012  .  Osteopenia   :  Past Surgical History:  Procedure Laterality Date  . arthroscopy knee surgery     left  . BREAST SURGERY     Lumpectomy  . cataract surgery    . LASER PHOTO ABLATION Right 12/17/2013   Procedure: LASER PHOTO ABLATION;  Surgeon: Hayden Pedro, MD;  Location: [facility];  Service: Ophthalmology;  Laterality: Right;  . PARS PLANA VITRECTOMY Right 12/17/2013   Procedure: PARS PLANA VITRECTOMY WITH 25G REMOVAL/SUTURE INTRAOCULAR LENS, ENDOLASER;  Surgeon: Hayden Pedro, MD;  Location: [facility];  Service: Ophthalmology;  Laterality: Right;  . rotator cuff surgery  2011  . SPINE SURGERY    . TONSILLECTOMY AND ADENOIDECTOMY    . VITRECTOMY Right 12/17/2013   DR MATTHEWS  :   Current Facility-Administered Medications:  .  acetaminophen (TYLENOL) tablet 650 mg, 650 mg, Oral, Q4H PRN, Bonnell Public Tublu, MD .  albuterol (PROVENTIL) (2.5 MG/3ML) 0.083% nebulizer solution 2.5 mg, 2.5 mg, Nebulization, QID, Elwin Mocha, MD, 2.5 mg at 06/04/17 0757 .  albuterol (PROVENTIL) (2.5 MG/3ML) 0.083% nebulizer solution 2.5 mg, 2.5 mg, Nebulization, Q2H PRN, Elwin Mocha, MD .  atorvastatin (LIPITOR) tablet 10 mg, 10 mg, Oral, q1800, Bonnell Public Tublu, MD, 10 mg at 06/03/17 1745 .  azithromycin (ZITHROMAX) 500 mg in dextrose 5 % 250 mL IVPB, 500 mg, Intravenous, Q24H, Yopp, Amber C, RPH, Last Rate: 250 mL/hr at 06/04/17 0907, 500 mg at 06/04/17 0907 .  cefTRIAXone (ROCEPHIN) 1 g in dextrose 5 % 50 mL IVPB, 1 g, Intravenous, Q24H, Yopp, Amber C, RPH, Stopped at 06/04/17 0840 .  chlorhexidine (PERIDEX) 0.12 % solution 15 mL, 15 mL, Mouth Rinse, BID, Bonnell Public Tublu, MD, 15 mL at 06/03/17 2117 .  citalopram (CELEXA) tablet 40 mg, 40 mg, Oral, Daily, Bonnell Public Tublu, MD, 40 mg at 06/04/17 0907 .  hydrALAZINE (APRESOLINE) injection 10 mg, 10 mg, Intravenous, Q8H PRN, Elwin Mocha, MD .  HYDROmorphone (DILAUDID) injection 0.5 mg, 0.5 mg, Intravenous, Q4H  PRN, Elwin Mocha, MD, 0.5 mg at 06/03/17 1745 .  imipramine (TOFRANIL) tablet 100 mg, 100 mg, Oral, QHS, Bonnell Public Tublu, MD, 100 mg at 06/03/17 2116 .  latanoprost (XALATAN) 0.005 % ophthalmic solution 1 drop, 1 drop, Both Eyes, QHS, Chatterjee, Kyra Searles, MD, 1 drop at 06/03/17 2131 .  LORazepam (ATIVAN) injection 0.5 mg, 0.5 mg, Intravenous, Q6H PRN, Elwin Mocha, MD .  LORazepam (ATIVAN) tablet 1 mg, 1 mg, Oral, QHS, Bonnell Public Tublu, MD, 1 mg at 06/03/17 2116 .  losartan (COZAAR) tablet 100 mg, 100 mg, Oral, Daily, Bonnell Public Tublu, MD, 100 mg at 06/04/17 0907 .  MEDLINE mouth rinse, 15 mL, Mouth Rinse, q12n4p, Bonnell Public Tublu, MD, 15 mL at 06/03/17 1131 .  metoprolol tartrate (LOPRESSOR) tablet 12.5 mg, 12.5 mg, Oral, BID, Bonnell Public Tublu, MD, 12.5 mg at 06/04/17 0907 .  nitroGLYCERIN (NITROSTAT) SL tablet 0.4 mg, 0.4 mg, Sublingual, Q5 Min x 3 PRN, Jamse Arn, Dewaine Oats Tublu, MD .  ondansetron (ZOFRAN) injection 4 mg, 4 mg, Intravenous, Q6H PRN, Jamse Arn, Dewaine Oats Tublu, MD .  polyethylene glycol (MIRALAX / GLYCOLAX) packet 17 g, 17 g, Oral, Daily PRN, Bonnell Public Tublu, MD .  traZODone (DESYREL) tablet 200 mg, 200 mg, Oral, QHS, Bonnell Public Tublu, MD, 200 mg at 06/03/17 2116:  . albuterol  2.5 mg Nebulization QID  . atorvastatin  10 mg Oral q1800  . chlorhexidine  15 mL Mouth Rinse BID  . citalopram  40 mg Oral Daily  . imipramine  100 mg Oral QHS  . latanoprost  1 drop Both Eyes QHS  . LORazepam  1 mg Oral QHS  . losartan  100 mg Oral Daily  . mouth rinse  15 mL Mouth Rinse q12n4p  . metoprolol tartrate  12.5 mg Oral BID  . traZODone  200 mg Oral QHS  :  Allergies  Allergen Reactions  . Lactose Intolerance (Gi) Diarrhea  :   Family History  Problem Relation Age of Onset  . Bone cancer Mother   . Leukemia Father   . Lung cancer Unknown        husband     Social History   Socioeconomic  History  . Marital status: Widowed    Spouse name: Not on file  . Number of children: Not on file  . Years of education: Not on file  . Highest education level: Not on file  Social Needs  . Financial resource strain: Not on file  . Food insecurity - worry: Not on file  . Food insecurity - inability: Not on file  . Transportation needs - medical: Not on file  . Transportation needs - non-medical: Not on file  Occupational History  . Not on file  Tobacco Use  . Smoking status: Former Smoker    Types: Cigarettes  . Smokeless tobacco: Never Used  . Tobacco comment: "quit smoking cigarettes around 40 years ago"  Substance and Sexual Activity  . Alcohol use: Yes    Comment: per pt a glass of white wine before dinner   . Drug use: No  .  Sexual activity: Yes    Birth control/protection: Post-menopausal  Other Topics Concern  . Not on file  Social History Narrative  . Not on file     Review of Systems: As per HPI.  All other systems are negative.  Physical Exam:  Blood pressure (!) 106/56, pulse 89, temperature 98.7 F (37.1 C), temperature source Oral, resp. rate 20, height 5' 3" (1.6 m), weight 177 lb 0.5 oz (80.3 kg), SpO2 98 %.  Very pleasant elderly female who does not appear to be in acute respiratory distress at this time.  Fully interactive and oriented x3. HEENT: Anicteric sclera, moist mucous membranes, PERRLA. Lungs: Decreased air entry into the lungs bilaterally without expiratory wheezing.  Chest tube in place with small amount of serosanguineous fluid drainage Cardiac: Irregularly irregular heart rate, no significant tachycardia or murmurs.  Mild peripheral edema present Abdomen: Soft, nontender nondistended.  No hepatosplenomegaly, no palpable masses Lymph nodes: No palpable lymphadenopathy in the cervical, supraclavicular, or axillary areas Neurologic: No gross focal neurological deficits Skin: Pale, dry.  No petechiae or ecchymosis.  No rashes Musculoskeletal:  Unremarkable  LABS:  Recent Labs    06/03/17 1900 06/04/17 0305  WBC 15.3* 14.0*  HGB 9.1* 8.5*  HCT 29.3* 27.4*  PLT 215 195    Recent Labs    06/03/17 0246 06/04/17 0305  NA 141 142  K 5.0 5.0  CL 111 114*  CO2 22 20*  GLUCOSE 98 82  BUN 38* 40*  CREATININE 1.65* 1.57*  CALCIUM 8.2* 8.0*      RADIOLOGY:  Ct Chest Wo Contrast  Result Date: 06/03/2017 CLINICAL DATA:  Moderate to large left pleural effusion on a portable chest earlier today. EXAM: CT CHEST WITHOUT CONTRAST TECHNIQUE: Multidetector CT imaging of the chest was performed following the standard protocol without IV contrast. COMPARISON:  Portable chest obtained earlier today. Chest CT report dated 03/15/2001. FINDINGS: Cardiovascular: Atheromatous calcifications, including the coronary arteries and aorta. Normal sized heart. Enlarged central pulmonary arteries. The right main pulmonary artery has a maximum diameter of 3.1 cm and the left main pulmonary artery has a maximum diameter of 2.5 cm. The main pulmonary artery segment has a diameter of 2.9 cm. Mediastinum/Nodes: Mildly lobulated thyroid contours with mild enlargement of the right lobe and a 7 mm low density nodule in the posterior aspect of the right lobe. There is also a hyperdense nodule arising from the inferior aspect of the thyroid gland at the isthmus on the left, measuring 1.4 cm in maximum diameter on image number 29 of series 3. Mildly prominent low density subcarinal node and similar-appearing adjacent right hilar node. The subcarinal node has a short axis diameter 9 mm on image number 75 of series 3 in the hilar node has a short axis diameter of 9 mm on image number 79 of series 3. Lungs/Pleura: Again demonstrated is a moderate to large-sized left pleural effusion with adjacent atelectasis in the left upper lobe and left lower lobe. The atelectatic left lower lobe contains multiple calcific densities and there are small punctate calcific densities in the  atelectatic left upper lobe. The bronchial walls are calcified and no endobronchial masses are identified. There is a rind of soft tissue thickening at the left lung apex anteriorly and medially, measuring 2.3 cm in thickness on image number 27 of series 3. Triangular-shaped area of focal, dense lung consolidation in the posterolateral aspect of the right middle lobe. Small amount of linear density in the right upper lobe. There is  also a focal area of ground-glass interstitial opacity in the posterior aspect of the right upper lobe, measuring 3.9 x 2.3 cm on image number 28 of series 4. Upper Abdomen: Poorly defined right lobe liver mass measuring approximately 4.8 x 4.2 cm on image number 118 of series 3. The spleen is displaced medially and anteriorly by the left pleural fluid. No free peritoneal fluid is seen. Musculoskeletal: Extensive thoracic spine degenerative changes. Bilateral shoulder degenerative changes. IMPRESSION: 1. Thick rind of soft tissue at the left lung apex anteriorly and medially, suspicious for pleural based malignancy. 2. Moderate to large left pleural effusion with mass effect on the upper abdomen, suspicious for a malignant effusion. 3. Compressive atelectasis of the left upper lobe and left lower lobe. 4. 3.9 x 2.3 cm focal ground-glass opacity in the right upper lobe. This could be infectious, inflammatory or neoplastic in nature. 5. Focal atelectasis common pneumonia or pulmonary infarction in the posterolateral aspect of the right middle lobe. 6. Mild mediastinal and right hilar adenopathy. This could represent metastatic or reactive adenopathy. 7. Prominent central pulmonary arteries, mainly involving the right main pulmonary artery. This can be seen with pulmonary arterial hypertension. 8. Poorly defined right lobe liver mass measuring approximately 4.8 x 4.2 cm. This is suspicious for a metastasis. A primary liver neoplasm is also a possibility. 9. 1.4 cm hyperdense exophytic nodule  arising from the isthmus of the thyroid gland on the left. Consider further evaluation with thyroid ultrasound. If patient is clinically hyperthyroid, consider nuclear medicine thyroid uptake and scan. 10.  Calcific coronary artery and aortic atherosclerosis. Aortic Atherosclerosis (ICD10-I70.0). Electronically Signed   By: Claudie Revering M.D.   On: 06/03/2017 15:00   Dg Chest Port 1 View  Result Date: 06/04/2017 CLINICAL DATA:  Pneumothorax. EXAM: PORTABLE CHEST 1 VIEW COMPARISON:  06/03/2017 FINDINGS: Left pigtail pleural catheter remains in place. The cardiomediastinal silhouette is unchanged. No definite residual left pneumothorax is identified. There is a persistent small left pleural effusion, and there is pleural thickening at the lung apex. Right lateral costophrenic angle blunting is unchanged without evidence of a sizable pleural effusion. Patchy left basilar airspace opacity is unchanged. The right lung remains clear. IMPRESSION: 1. No residual pneumothorax identified. 2. Unchanged small left pleural effusion and left basilar atelectasis. Electronically Signed   By: Logan Bores M.D.   On: 06/04/2017 10:03   Dg Chest Port 1 View  Result Date: 06/03/2017 CLINICAL DATA:  Chest tube placement for large left pleural effusion. EXAM: PORTABLE CHEST 1 VIEW 5:12 p.m.: COMPARISON:  CT chest and portable chest x-ray earlier same day. FINDINGS: Pigtail catheter placed into the left pleural space with marked improvement in the left pleural effusion since earlier today. There is a small (approximate 5-10%) left apicolateral pneumothorax. A small to moderate-sized effusion persists at the base of the left hemithorax, with associated consolidation involving the left lower lobe. Right lung remains clear with mild pleuroparenchymal scarring at the right base accounting for the blunted costophrenic angle as demonstrated on the CT earlier today. IMPRESSION: 1. Marked reduction in size of the left pleural effusion after  chest tube placement. A small (approximate 5-10%) left apicolateral pneumothorax is present. 2. Residual small to moderate-sized left pleural effusion with associated passive atelectasis and/or pneumonia involving the left lower lobe. These results will be called to the ordering clinician or representative by the Radiologist Assistant, and communication documented in the PACS or zVision Dashboard. Electronically Signed   By: Sherran Needs.D.  On: 06/03/2017 17:57   Dg Chest Port 1 View  Result Date: 06/03/2017 CLINICAL DATA:  Pleural effusion EXAM: PORTABLE CHEST 1 VIEW COMPARISON:  06/16/2017 FINDINGS: Moderate to large left pleural effusion. Left mid/lower lung is obscured with suspected compressive atelectasis. Right lung is essentially clear. Cardiomediastinal silhouette is obscured. No pneumothorax. IMPRESSION: Moderate to large left pleural effusion, grossly unchanged. Electronically Signed   By: Julian Hy M.D.   On: 06/03/2017 09:29   Dg Chest Portable 1 View  Result Date: 06/17/2017 CLINICAL DATA:  Labored breathing today. EXAM: PORTABLE CHEST 1 VIEW COMPARISON:  PA and lateral chest 11/26/2006. FINDINGS: The patient has a large left pleural effusion with associated airspace disease. The right lung is clear. Cardiac silhouette is largely obscured. No acute bony abnormality is identified. Postoperative change left acromioclavicular joint is seen. Severe degenerative disease about the left glenohumeral joint is noted. IMPRESSION: Large left pleural effusion and airspace disease. Electronically Signed   By: Inge Rise M.D.   On: 06/26/2017 07:47    Assessment and Plan:  84 y.o. female with history of right-sided invasive ductal carcinoma 10 years ago, stage I, hormone-receptor positive, HER-2 negative who was treated with lumpectomy followed by adjuvant endocrine therapy with tamoxifen followed by aromatase inhibitor.  Patient has completed a full course of treatment at that time.   Currently presenting with suspected malignancy involving her lung, pleural space, and liver.  The exact nature of the malignancy is unclear.  Pleural fluid has been collected and submitted for analysis.  Differential diagnosis includes delayed recurrence of low-grade breast cancer versus the novel malignancy of unknown primary.  Recommendations: -Await results of the pleural fluid.  I have discussed options of proceeding with additional evaluation such as CT of the abdomen and pelvis and additional biopsies with the family.  They are open to idea of better understanding of the patient's malignancy, but would like to be conservative with invasiveness of both workup and potential therapies accounting the focus on the comfort that patient prefers as well as her advanced age. -We will defer management of the chest tube including potential retention of the tube after discharge for comfort drainage to the discretion of the primary treating services -I will see the patient daily while she stays in the hospital.  On discharge, on request of the family, I will assume primary role for oncological care for the patient.   Ardath Sax, MD 06/04/2017, 11:00 AM

## 2017-06-04 NOTE — Progress Notes (Addendum)
[facility] PROGRESS NOTE  Stephanie Dickerson LFY:[id number] DOB: February 17, 1924 DOA: 06/11/2017 PCP: Lucretia Kern, DO  Assessment/Plan:  Acute Resp Failure 2/2 Pleural Effusion  Chest x-ray this a.m. shows that pleural effusion has gotten smaller, pneumothorax better Critical care consulted for chest tube placement, has put out 2500cc so far fluid is less bloody Likely parapneumonic effusion or related to the patient's past history of breast cancer Cont azithro & rocephin Sch & prn albuterol  Liver and lung mass Onc consult RUQ U/S ordered Pleural fluid cytology pending  NSTEMI When necessary nitroglycerin sublingual Echo LVEF 45-50%, grade 1 DD Stopped asa & heparin  Hypertension When necessary hydralazine 10 mg IV as needed for severe blood pressure Cont cozaar, lopressor  Hyperlipidemia Continue statin  Anxiety/Depression Cont celexa, desyrel  Hyperlipidemia Continue statin   Code Status: Full Code Family Communication: Son at bedside (indicate person spoken with, relationship, and if by phone, the number) Disposition Plan: TBD   Consultants:  PulmonaryPulmonary  Procedures:  ---  Antibiotics:  Azithromycin and Rocephin 21/-> P (indicate start date, and stop date if known)  HPI/Subjective: Off bipap. Pt states she is less SOB. Her and son want to prioritize quality of life.   Objective: Vitals:   06/04/17 0757 06/04/17 0801  BP:  (!) 106/56  Pulse:  89  Resp:    Temp:    SpO2: 98%     Intake/Output Summary (Last 24 hours) at 06/04/2017 1046 Last data filed at 06/04/2017 0600 Gross per 24 hour  Intake 2363.33 ml  Output 2500 ml  Net -136.67 ml   Filed Weights   06/06/2017 1005 06/03/17 0448 06/04/17 0559  Weight: 79.4 kg (175 lb) 83.4 kg (183 lb 13.8 oz) 80.3 kg (177 lb 0.5 oz)    Exam:   General:  NCAT, anxious  Cardiovascular: tachy, no MRG  Respiratory: Tachypnea, incr WOB  Abdomen: ND, BS+, NT  Musculoskeletal: moving all  extr     Data Reviewed: Basic Metabolic Panel: Recent Labs  Lab 06/16/2017 0710 06/03/17 0246 06/04/17 0305  NA 142 141 142  K 4.5 5.0 5.0  CL 108 111 114*  CO2 20* 22 20*  GLUCOSE 146* 98 82  BUN 38* 38* 40*  CREATININE 1.64* 1.65* 1.57*  CALCIUM 8.9 8.2* 8.0*   Liver Function Tests: No results for input(s): AST, ALT, ALKPHOS, BILITOT, PROT, ALBUMIN in the last 168 hours. No results for input(s): LIPASE, AMYLASE in the last 168 hours. No results for input(s): AMMONIA in the last 168 hours. CBC: Recent Labs  Lab 06/27/2017 0710 06/03/17 0246 06/03/17 1900 06/04/17 0305  WBC 21.3* 15.1* 15.3* 14.0*  NEUTROABS  --   --  12.5*  --   HGB 10.2* 9.1* 9.1* 8.5*  HCT 32.4* 29.2* 29.3* 27.4*  MCV 93.1 94.8 95.8 96.1  PLT 247 227 215 195   Cardiac Enzymes: Recent Labs  Lab 06/24/2017 1053 06/06/2017 1741 06/26/2017 2251  TROPONINI 1.56* 1.38* 1.57*   BNP (last 3 results) No results for input(s): BNP in the last 8760 hours.  ProBNP (last 3 results) No results for input(s): PROBNP in the last 8760 hours.  CBG: Recent Labs  Lab 06/03/17 1137  GLUCAP 107*    Recent Results (from the past 240 hour(s))  Blood Culture (routine x 2)     Status: None (Preliminary result)   Collection Time: 06/28/2017  7:40 AM  Result Value Ref Range Status   Specimen Description BLOOD RIGHT WRIST  Final   Special Requests  IN PEDIATRIC BOTTLE Blood Culture adequate volume  Final   Culture   Final    NO GROWTH 1 DAY Performed at [facility], [place] [address] [place], [place] [address]    Report Status PENDING  Incomplete  Blood Culture (routine x 2)     Status: None (Preliminary result)   Collection Time: 06/03/2017  7:51 AM  Result Value Ref Range Status   Specimen Description BLOOD RIGHT HAND  Final   Special Requests   Final    BOTTLES DRAWN AEROBIC ONLY Blood Culture adequate volume   Culture   Final    NO GROWTH 1 DAY Performed at [facility], [place] [address],  [place], [place] [address]    Report Status PENDING  Incomplete  Stat Gram stain     Status: None   Collection Time: 06/03/17  5:28 PM  Result Value Ref Range Status   Specimen Description PLEURAL  Final   Special Requests NONE  Final   Gram Stain   Final    NO WBC SEEN NO ORGANISMS SEEN Performed at [facility], [address], [place], [place] [address]    Report Status 06/03/2017 FINAL  Final  Culture, body fluid-bottle     Status: None (Preliminary result)   Collection Time: 06/03/17  5:28 PM  Result Value Ref Range Status   Specimen Description PLEURAL  Final   Special Requests   Final    BOTTLES DRAWN AEROBIC AND ANAEROBIC Performed at [facility], [place] [address], [place], [place] [address]    Culture PENDING  Incomplete   Report Status PENDING  Incomplete     Studies: Ct Chest Wo Contrast  Result Date: 06/03/2017 CLINICAL DATA:  Moderate to large left pleural effusion on a portable chest earlier today. EXAM: CT CHEST WITHOUT CONTRAST TECHNIQUE: Multidetector CT imaging of the chest was performed following the standard protocol without IV contrast. COMPARISON:  Portable chest obtained earlier today. Chest CT report dated 03/15/2001. FINDINGS: Cardiovascular: Atheromatous calcifications, including the coronary arteries and aorta. Normal sized heart. Enlarged central pulmonary arteries. The right main pulmonary artery has a maximum diameter of 3.1 cm and the left main pulmonary artery has a maximum diameter of 2.5 cm. The main pulmonary artery segment has a diameter of 2.9 cm. Mediastinum/Nodes: Mildly lobulated thyroid contours with mild enlargement of the right lobe and a 7 mm low density nodule in the posterior aspect of the right lobe. There is also a hyperdense nodule arising from the inferior aspect of the thyroid gland at the isthmus on the left, measuring 1.4 cm in maximum diameter on image number 29 of series 3. Mildly prominent low density subcarinal node and  similar-appearing adjacent right hilar node. The subcarinal node has a short axis diameter 9 mm on image number 75 of series 3 in the hilar node has a short axis diameter of 9 mm on image number 79 of series 3. Lungs/Pleura: Again demonstrated is a moderate to large-sized left pleural effusion with adjacent atelectasis in the left upper lobe and left lower lobe. The atelectatic left lower lobe contains multiple calcific densities and there are small punctate calcific densities in the atelectatic left upper lobe. The bronchial walls are calcified and no endobronchial masses are identified. There is a rind of soft tissue thickening at the left lung apex anteriorly and medially, measuring 2.3 cm in thickness on image number 27 of series 3. Triangular-shaped area of focal, dense lung consolidation in  the posterolateral aspect of the right middle lobe. Small amount of linear density in the right upper lobe. There is also a focal area of ground-glass interstitial opacity in the posterior aspect of the right upper lobe, measuring 3.9 x 2.3 cm on image number 28 of series 4. Upper Abdomen: Poorly defined right lobe liver mass measuring approximately 4.8 x 4.2 cm on image number 118 of series 3. The spleen is displaced medially and anteriorly by the left pleural fluid. No free peritoneal fluid is seen. Musculoskeletal: Extensive thoracic spine degenerative changes. Bilateral shoulder degenerative changes. IMPRESSION: 1. Thick rind of soft tissue at the left lung apex anteriorly and medially, suspicious for pleural based malignancy. 2. Moderate to large left pleural effusion with mass effect on the upper abdomen, suspicious for a malignant effusion. 3. Compressive atelectasis of the left upper lobe and left lower lobe. 4. 3.9 x 2.3 cm focal ground-glass opacity in the right upper lobe. This could be infectious, inflammatory or neoplastic in nature. 5. Focal atelectasis common pneumonia or pulmonary infarction in the  posterolateral aspect of the right middle lobe. 6. Mild mediastinal and right hilar adenopathy. This could represent metastatic or reactive adenopathy. 7. Prominent central pulmonary arteries, mainly involving the right main pulmonary artery. This can be seen with pulmonary arterial hypertension. 8. Poorly defined right lobe liver mass measuring approximately 4.8 x 4.2 cm. This is suspicious for a metastasis. A primary liver neoplasm is also a possibility. 9. 1.4 cm hyperdense exophytic nodule arising from the isthmus of the thyroid gland on the left. Consider further evaluation with thyroid ultrasound. If patient is clinically hyperthyroid, consider nuclear medicine thyroid uptake and scan. 10.  Calcific coronary artery and aortic atherosclerosis. Aortic Atherosclerosis (ICD10-I70.0). Electronically Signed   By: Claudie Revering M.D.   On: 06/03/2017 15:00   Dg Chest Port 1 View  Result Date: 06/04/2017 CLINICAL DATA:  Pneumothorax. EXAM: PORTABLE CHEST 1 VIEW COMPARISON:  06/03/2017 FINDINGS: Left pigtail pleural catheter remains in place. The cardiomediastinal silhouette is unchanged. No definite residual left pneumothorax is identified. There is a persistent small left pleural effusion, and there is pleural thickening at the lung apex. Right lateral costophrenic angle blunting is unchanged without evidence of a sizable pleural effusion. Patchy left basilar airspace opacity is unchanged. The right lung remains clear. IMPRESSION: 1. No residual pneumothorax identified. 2. Unchanged small left pleural effusion and left basilar atelectasis. Electronically Signed   By: Logan Bores M.D.   On: 06/04/2017 10:03   Dg Chest Port 1 View  Result Date: 06/03/2017 CLINICAL DATA:  Chest tube placement for large left pleural effusion. EXAM: PORTABLE CHEST 1 VIEW 5:12 p.m.: COMPARISON:  CT chest and portable chest x-ray earlier same day. FINDINGS: Pigtail catheter placed into the left pleural space with marked improvement in  the left pleural effusion since earlier today. There is a small (approximate 5-10%) left apicolateral pneumothorax. A small to moderate-sized effusion persists at the base of the left hemithorax, with associated consolidation involving the left lower lobe. Right lung remains clear with mild pleuroparenchymal scarring at the right base accounting for the blunted costophrenic angle as demonstrated on the CT earlier today. IMPRESSION: 1. Marked reduction in size of the left pleural effusion after chest tube placement. A small (approximate 5-10%) left apicolateral pneumothorax is present. 2. Residual small to moderate-sized left pleural effusion with associated passive atelectasis and/or pneumonia involving the left lower lobe. These results will be called to the ordering clinician or representative by the Radiologist Assistant,  and communication documented in the PACS or zVision Dashboard. Electronically Signed   By: Evangeline Dakin M.D.   On: 06/03/2017 17:57   Dg Chest Port 1 View  Result Date: 06/03/2017 CLINICAL DATA:  Pleural effusion EXAM: PORTABLE CHEST 1 VIEW COMPARISON:  06/26/2017 FINDINGS: Moderate to large left pleural effusion. Left mid/lower lung is obscured with suspected compressive atelectasis. Right lung is essentially clear. Cardiomediastinal silhouette is obscured. No pneumothorax. IMPRESSION: Moderate to large left pleural effusion, grossly unchanged. Electronically Signed   By: Julian Hy M.D.   On: 06/03/2017 09:29    Scheduled Meds: . albuterol  2.5 mg Nebulization QID  . atorvastatin  10 mg Oral q1800  . chlorhexidine  15 mL Mouth Rinse BID  . citalopram  40 mg Oral Daily  . imipramine  100 mg Oral QHS  . latanoprost  1 drop Both Eyes QHS  . LORazepam  1 mg Oral QHS  . losartan  100 mg Oral Daily  . mouth rinse  15 mL Mouth Rinse q12n4p  . metoprolol tartrate  12.5 mg Oral BID  . traZODone  200 mg Oral QHS   Continuous Infusions: . azithromycin 500 mg (06/04/17 0907)   . cefTRIAXone (ROCEPHIN)  IV Stopped (06/04/17 0840)    Principal Problem:   Acute respiratory failure (HCC) Active Problems:   Hyperlipidemia   Hypertension   NSTEMI (non-ST elevated myocardial infarction) ([place])   Pleural effusion   CAP (community acquired pneumonia)   Counseling regarding end of life decision making   Acute renal disease    Time spent: [facility] Pager AMIONIf 7PM-7AM, please contact night-coverage at [email or web address], password [facility] 06/04/2017, 10:46 AM  LOS: 2 days

## 2017-06-05 DIAGNOSIS — J95811 Postprocedural pneumothorax: Secondary | ICD-10-CM

## 2017-06-05 LAB — CBC
HCT: 33.8 % — ABNORMAL LOW (ref 36.0–46.0)
Hemoglobin: 10.3 g/dL — ABNORMAL LOW (ref 12.0–15.0)
MCH: 28.7 pg (ref 26.0–34.0)
MCHC: 30.5 g/dL (ref 30.0–36.0)
MCV: 94.2 fL (ref 78.0–100.0)
Platelets: 200 10*3/uL (ref 150–400)
RBC: 3.59 MIL/uL — ABNORMAL LOW (ref 3.87–5.11)
RDW: 15.9 % — ABNORMAL HIGH (ref 11.5–15.5)
WBC: 20.3 10*3/uL — ABNORMAL HIGH (ref 4.0–10.5)

## 2017-06-05 LAB — SEDIMENTATION RATE: Sed Rate: 25 mm/hr — ABNORMAL HIGH (ref 0–22)

## 2017-06-05 NOTE — Plan of Care (Signed)
  Progressing Education: Knowledge of General Education information will improve 06/05/2017 0106 - Progressing by Blair Promise, RN Health Behavior/Discharge Planning: Ability to manage health-related needs will improve 06/05/2017 0106 - Progressing by Blair Promise, RN Clinical Measurements: Ability to maintain clinical measurements within normal limits will improve 06/05/2017 0106 - Progressing by Blair Promise, RN Will remain free from infection 06/05/2017 0106 - Progressing by Blair Promise, RN Diagnostic test results will improve 06/05/2017 0106 - Progressing by Blair Promise, RN Respiratory complications will improve 06/05/2017 0106 - Progressing by Blair Promise, RN Cardiovascular complication will be avoided 06/05/2017 0106 - Progressing by Blair Promise, RN Activity: Risk for activity intolerance will decrease 06/05/2017 0106 - Progressing by Blair Promise, RN Nutrition: Adequate nutrition will be maintained 06/05/2017 0106 - Progressing by Blair Promise, RN Coping: Level of anxiety will decrease 06/05/2017 0106 - Progressing by Blair Promise, RN Elimination: Will not experience complications related to bowel motility 06/05/2017 0106 - Progressing by Blair Promise, RN Will not experience complications related to urinary retention 06/05/2017 0106 - Progressing by Blair Promise, RN Pain Managment: General experience of comfort will improve 06/05/2017 0106 - Progressing by Blair Promise, RN Safety: Ability to remain free from injury will improve 06/05/2017 0106 - Progressing by Blair Promise, RN Skin Integrity: Risk for impaired skin integrity will decrease 06/05/2017 0106 - Progressing by Blair Promise, RN

## 2017-06-05 NOTE — Progress Notes (Signed)
Name: Stephanie Dickerson MRN: [id number] DOB: 15-Oct-1923    ADMISSION DATE:  06/29/2017 CONSULTATION DATE: June 03, 2017  REFERRING MD : Hospitalist service  CHIEF COMPLAINT: Shortness of breath tachypnea pleural effusion  BRIEF PATIENT DESCRIPTION:  84-yowf remote smoker from [place]  who is here because of acute respiratory failure shortness of breath she lives with her son she has been active until 6 months ago  And around 2 weeks PA developed uri symptoms with proressive sob so came into the ER where placed on on BiPAP she with dx non-STEMI  And placed on heparin and CCM service consulted on a large L effusion.    SUBJECTIVE:  Approx 100 ml out of CT overnight.  Son at bedside, states if malignant fluid, family would want palliative medicine.     Intake/Output Summary (Last 24 hours) at 06/05/2017 1219 Last data filed at 06/05/2017 0552 Gross per 24 hour  Intake -  Output 600 ml  Net -600 ml     VITAL SIGNS: Temp:  [97.7 F (36.5 C)-98.6 F (37 C)] 97.7 F (36.5 C) (02/04 0753) Pulse Rate:  [71-111] 111 (02/04 0551) Resp:  [17-29] 21 (02/04 0753) BP: (92-127)/(51-106) 120/51 (02/04 0753) SpO2:  [96 %-97 %] 96 % (02/04 0551) Weight:  [185 lb 13.6 oz (84.3 kg)] 185 lb 13.6 oz (84.3 kg) (02/04 0551)    PHYSICAL EXAMINATION: General: frail elderly female in NAD, lying in bed HEENT: MM pink/moist Neuro: resting with eyes closed, does not wake to voice CV: s1s2 irr irr, 2/6 SEM murmur PULM: even/non-labored, lungs bilaterally clear anterior, diminished RLL  ZS:WFUX, non-tender, bsx4 active  Extremities: warm/dry, no edema  Skin: no rashes or lesions  Recent Labs  Lab 06/05/2017 0710 06/03/17 0246 06/04/17 0305  NA 142 141 142  K 4.5 5.0 5.0  CL 108 111 114*  CO2 20* 22 20*  BUN 38* 38* 40*  CREATININE 1.64* 1.65* 1.57*  GLUCOSE 146* 98 82   Recent Labs  Lab 06/03/17 1900 06/04/17 0305 06/05/17 0920  HGB 9.1* 8.5* 10.3*  HCT 29.3* 27.4* 33.8*  WBC  15.3* 14.0* 20.3*  PLT 215 195 200   Ct Chest Wo Contrast  Result Date: 06/03/2017 CLINICAL DATA:  Moderate to large left pleural effusion on a portable chest earlier today. EXAM: CT CHEST WITHOUT CONTRAST TECHNIQUE: Multidetector CT imaging of the chest was performed following the standard protocol without IV contrast. COMPARISON:  Portable chest obtained earlier today. Chest CT report dated 03/15/2001. FINDINGS: Cardiovascular: Atheromatous calcifications, including the coronary arteries and aorta. Normal sized heart. Enlarged central pulmonary arteries. The right main pulmonary artery has a maximum diameter of 3.1 cm and the left main pulmonary artery has a maximum diameter of 2.5 cm. The main pulmonary artery segment has a diameter of 2.9 cm. Mediastinum/Nodes: Mildly lobulated thyroid contours with mild enlargement of the right lobe and a 7 mm low density nodule in the posterior aspect of the right lobe. There is also a hyperdense nodule arising from the inferior aspect of the thyroid gland at the isthmus on the left, measuring 1.4 cm in maximum diameter on image number 29 of series 3. Mildly prominent low density subcarinal node and similar-appearing adjacent right hilar node. The subcarinal node has a short axis diameter 9 mm on image number 75 of series 3 in the hilar node has a short axis diameter of 9 mm on image number 79 of series 3. Lungs/Pleura: Again demonstrated is a moderate to large-sized left pleural  effusion with adjacent atelectasis in the left upper lobe and left lower lobe. The atelectatic left lower lobe contains multiple calcific densities and there are small punctate calcific densities in the atelectatic left upper lobe. The bronchial walls are calcified and no endobronchial masses are identified. There is a rind of soft tissue thickening at the left lung apex anteriorly and medially, measuring 2.3 cm in thickness on image number 27 of series 3. Triangular-shaped area of focal, dense  lung consolidation in the posterolateral aspect of the right middle lobe. Small amount of linear density in the right upper lobe. There is also a focal area of ground-glass interstitial opacity in the posterior aspect of the right upper lobe, measuring 3.9 x 2.3 cm on image number 28 of series 4. Upper Abdomen: Poorly defined right lobe liver mass measuring approximately 4.8 x 4.2 cm on image number 118 of series 3. The spleen is displaced medially and anteriorly by the left pleural fluid. No free peritoneal fluid is seen. Musculoskeletal: Extensive thoracic spine degenerative changes. Bilateral shoulder degenerative changes. IMPRESSION: 1. Thick rind of soft tissue at the left lung apex anteriorly and medially, suspicious for pleural based malignancy. 2. Moderate to large left pleural effusion with mass effect on the upper abdomen, suspicious for a malignant effusion. 3. Compressive atelectasis of the left upper lobe and left lower lobe. 4. 3.9 x 2.3 cm focal ground-glass opacity in the right upper lobe. This could be infectious, inflammatory or neoplastic in nature. 5. Focal atelectasis common pneumonia or pulmonary infarction in the posterolateral aspect of the right middle lobe. 6. Mild mediastinal and right hilar adenopathy. This could represent metastatic or reactive adenopathy. 7. Prominent central pulmonary arteries, mainly involving the right main pulmonary artery. This can be seen with pulmonary arterial hypertension. 8. Poorly defined right lobe liver mass measuring approximately 4.8 x 4.2 cm. This is suspicious for a metastasis. A primary liver neoplasm is also a possibility. 9. 1.4 cm hyperdense exophytic nodule arising from the isthmus of the thyroid gland on the left. Consider further evaluation with thyroid ultrasound. If patient is clinically hyperthyroid, consider nuclear medicine thyroid uptake and scan. 10.  Calcific coronary artery and aortic atherosclerosis. Aortic Atherosclerosis  (ICD10-I70.0). Electronically Signed   By: Claudie Revering M.D.   On: 06/03/2017 15:00   Dg Chest Port 1 View  Result Date: 06/04/2017 CLINICAL DATA:  Pneumothorax. EXAM: PORTABLE CHEST 1 VIEW COMPARISON:  06/03/2017 FINDINGS: Left pigtail pleural catheter remains in place. The cardiomediastinal silhouette is unchanged. No definite residual left pneumothorax is identified. There is a persistent small left pleural effusion, and there is pleural thickening at the lung apex. Right lateral costophrenic angle blunting is unchanged without evidence of a sizable pleural effusion. Patchy left basilar airspace opacity is unchanged. The right lung remains clear. IMPRESSION: 1. No residual pneumothorax identified. 2. Unchanged small left pleural effusion and left basilar atelectasis. Electronically Signed   By: Logan Bores M.D.   On: 06/04/2017 10:03   Dg Chest Port 1 View  Result Date: 06/03/2017 CLINICAL DATA:  Chest tube placement for large left pleural effusion. EXAM: PORTABLE CHEST 1 VIEW 5:12 p.m.: COMPARISON:  CT chest and portable chest x-ray earlier same day. FINDINGS: Pigtail catheter placed into the left pleural space with marked improvement in the left pleural effusion since earlier today. There is a small (approximate 5-10%) left apicolateral pneumothorax. A small to moderate-sized effusion persists at the base of the left hemithorax, with associated consolidation involving the left lower lobe. Right  lung remains clear with mild pleuroparenchymal scarring at the right base accounting for the blunted costophrenic angle as demonstrated on the CT earlier today. IMPRESSION: 1. Marked reduction in size of the left pleural effusion after chest tube placement. A small (approximate 5-10%) left apicolateral pneumothorax is present. 2. Residual small to moderate-sized left pleural effusion with associated passive atelectasis and/or pneumonia involving the left lower lobe. These results will be called to the ordering  clinician or representative by the Radiologist Assistant, and communication documented in the PACS or zVision Dashboard. Electronically Signed   By: Evangeline Dakin M.D.   On: 06/03/2017 17:57   US Abdomen Limited Ruq  Result Date: 06/04/2017 CLINICAL DATA:  Abnormal liver CT EXAM: ULTRASOUND ABDOMEN LIMITED RIGHT UPPER QUADRANT COMPARISON:  CT chest 06/03/2017 FINDINGS: Gallbladder: No gallstones or wall thickening visualized. No sonographic Murphy sign noted by sonographer. Common bile duct: Diameter: 4.2 mm Liver: At least 2 hypoechoic solid masses are visualized within the right hepatic lobe. The more posterior mass measures 3.8 x 3.4 x 4.5 cm and corresponds to the CT demonstrated mass. Smaller anterior mass measures 1.8 x 1.8 x 2.5 cm. Portal vein is patent on color Doppler imaging with normal direction of blood flow towards the liver. IMPRESSION: 1. Negative for gallstones or biliary dilatation 2. At least 2 solid-appearing hypoechoic masses in the right lobe of liver, findings would be concerning for metastatic disease Electronically Signed   By: Donavan Foil M.D.   On: 06/04/2017 22:59   SIGNIFICANT EVENTS  2/02  Left Chest tube >>   STUDIES: L Pleural effusion 2/2 >> xerosanguinous, Exudate by LDH 328 / WBC 205  With P > L.  L Pleural Cytology 2/2 >>   CULTURES  BC x 2/1 >> Influenza 2/1 >> negative L Pleural Fluid 2/2 >>  Quantiferon Gold 2/4 >>   ASSESSMENT / PLAN:  Discussion: 84 y/o F, remote smoker, admitted 2/1 with SOB and decline in activity level.  Two weeks PTA, she developed URI symptoms.  Work up concerning for NSTEMI and large left pleural effusion.  Left pigtail catheter placed 2/2 with exudative properties, cytology pending.  Called pathology 2/6 am and special stains are pending.    Left Pleural Effusion - large effusion s/p chest tube placement 2/2, slightly bloody on drainage, exudate (ddx malignant vs infection) P: Await pathology > special stains pending    Follow CXR intermittently  ONC following  Monitor drainage, if malignant, consider transition to pleurX and palliative care    Acute Hypoxic Respiratory Failure  P: Wean O2 for sats > 90%   RUL Ground Glass - infection vs malignancy.  Recent URI sx P: ABX per primary, narrow for cultures  Await quantiferon gold    Noe Gens, NP-C [facility] Pgr: [phone] or if no answer [phone] 06/05/2017, 12:22 PM  Attending:  I have seen and examined the patient with nurse practitioner/resident and agree with the note above.  We formulated the plan together and I elicited the following history.    Subjective: Pleural effusion.   Objective: Vitals:   06/06/17 1556 06/06/17 1600 06/06/17 1952 06/07/17 0351  BP: (!) 106/56 (!) 104/50 115/62 (!) 109/59  Pulse: 73 69 (!) 109 92  Resp: 18 19 19  (!) 27  Temp: 97.7 F (36.5 C)  98.4 F (36.9 C) 98.4 F (36.9 C)  TempSrc: Oral  Oral Axillary  SpO2: 100% 100% 100% 100%  Weight:    79.6 kg (175 lb 7.8 oz)  Height:          Intake/Output Summary (Last 24 hours) at 06/07/2017 1511 Last data filed at 06/07/2017 0600 Gross per 24 hour  Intake -  Output 800 ml  Net -800 ml     PHYSICAL EXAMINATION: General: asleep and without distress. HEENT: MM pink/moist. No JVD. Neuro: resting with eyes closed, eyes open to loud voice. CV: s1s2 irr irr, 2/6 SEM murmur. No gallop. PULM: even/non-labored, lungs bilaterally clear anterior, diminished RLL  WI:OXBD, non-tender, bsx4 active  Extremities: warm/dry, no edema  Skin: no rashes or lesions  No air leak in the Pleur Evac system.   CBC    Component Value Date/Time   WBC 25.7 (H) 06/07/2017 0307   RBC 3.86 (L) 06/07/2017 0307   HGB 11.0 (L) 06/07/2017 0307   HGB 12.3 12/05/2014 1229   HCT 35.6 (L) 06/07/2017 0307   HCT 38.0 12/05/2014 1229   PLT 172 06/07/2017 0307   PLT 212 12/05/2014 1229   MCV 92.2 06/07/2017 0307   MCV 92.4 12/05/2014 1229   MCH 28.5  06/07/2017 0307   MCHC 30.9 06/07/2017 0307   RDW 15.7 (H) 06/07/2017 0307   RDW 14.3 12/05/2014 1229   LYMPHSABS 1.0 06/07/2017 0307   LYMPHSABS 1.4 12/05/2014 1229   MONOABS 2.6 (H) 06/07/2017 0307   MONOABS 0.6 12/05/2014 1229   EOSABS 1.0 (H) 06/07/2017 0307   EOSABS 0.2 12/05/2014 1229   BASOSABS 0.0 06/07/2017 0307   BASOSABS 0.1 12/05/2014 1229    BMET    Component Value Date/Time   NA 140 06/06/2017 0337   NA 144 12/05/2014 1230   K 4.4 06/06/2017 0337   K 4.8 12/05/2014 1230   CL 109 06/06/2017 0337   CO2 20 (L) 06/06/2017 0337   CO2 30 (H) 12/05/2014 1230   GLUCOSE 129 (H) 06/06/2017 0337   GLUCOSE 99 12/05/2014 1230   BUN 23 (H) 06/06/2017 0337   BUN 28.6 (H) 12/05/2014 1230   CREATININE 1.13 (H) 06/06/2017 0337   CREATININE 1.2 (H) 12/05/2014 1230   CALCIUM 8.6 (L) 06/06/2017 0337   CALCIUM 9.7 12/05/2014 1230   GFRNONAA 41 (L) 06/06/2017 0337   GFRAA 48 (L) 06/06/2017 0337    CXR images  DG CHEST PORT 1 VIEW  Final Result    US Abdomen Limited RUQ  Final Result    DG CHEST PORT 1 VIEW  Final Result    DG Chest Port 1 View  Final Result    CT CHEST WO CONTRAST  Final Result    DG CHEST PORT 1 VIEW  Final Result    DG Chest Portable 1 View  Final Result       Impression/Plan: 1. Pleural effusion. Await cytology results. Continue tube drainage.   Jesus Genera, MD [facility] Pager: [phone]

## 2017-06-05 NOTE — Progress Notes (Signed)
PCCM continuing to follow.  Await cytology.  Will follow up once results returned.    Noe Gens, NP-C [facility] Pgr: [phone] or if no answer [phone] 06/05/2017, 2:05 PM  I concur with the above plan.  Christena Deem. Sabra Heck, MD  Pager # [phone]

## 2017-06-05 NOTE — Progress Notes (Signed)
PROGRESS NOTE    Stephanie Dickerson  OVF:[id number] DOB: 1923/12/31 DOA: 06/29/2017 PCP: Lucretia Kern, DO    Brief Narrative:  84 year old female who presented with worsening dyspnea. Patient does have a significant past medical history for hypertension, dyslipidemia history of breast cancer and depression. Patient was found with significant respiratory distress while lying in her bed, she was confused and lethargic. Apparently she had developed worsening dyspnea for last 3 to 4 days that initially was on exertion, and preceded by an upper respiratory tract infection, that was diagnosed about 2 weeks ago.   Her functional physical capacity has been significantly decreased after a hamstring tear.   On initial physical examination she was in acute respiratory distress, her oxygen saturation was 75% and she was placed on noninvasive mechanical ventilation. Blood pressure 126/88, heart rate 109, respiratory 29, temperature 97.4, oximetry 98%, on supplemental oxygen. Patient was confused, coarse breath sounds bilaterally, heart S1-S2 present tachycardic, abdomen soft nontender, no lower extremity edema. Sodium 142, potassium 4.5, chloride 108, bicarbonate 20, glucose 146, BUN 38, creatinine 1.64, calcium 8.9, troponin 0.95, white count 21.3, hemoglobin 10.2, hematocrit 32.4, platelets 247, chest x-ray was rotated to the right side, with a large left pleural effusion. EKG sinus rhythm, rate 84 bpm, left axis deviation, right bundle branch block.   Patient was admitted to the stepdown unit with working diagnosis acute hypoxic respiratory failure due to large left pleural effusion to rule out a para-pneumonic effusion complicating a community acquired pneumonia.   Assessment & Plan:   Principal Problem:   Acute respiratory failure (HCC) Active Problems:   Hyperlipidemia   Hypertension   NSTEMI (non-ST elevated myocardial infarction) ([place])   Pleural effusion   CAP (community acquired pneumonia)  Counseling regarding end of life decision making   Acute renal disease   Liver mass  1. Acute hypoxic respiratory failure due to left para-pneumonic pleural effusion, complicated with post pleural catheter pneumothorax. Patient with no dyspnea, no chest pain, no fevers or chills. Follow up chest film personally reviewed noted, pleural catheter in place on the left, with very small pleural effusion, no pneumothorax. Bloody exudative pleural effusion highly suggestive of malignancy, will plan for 5 days of antibiotic therapy (ceftriaxoen and azithromycin) for possible pneumonia. Low pretest probability for TB will discontinue respiratory isolation. Will order out of bed as tolerated and physical therapy, along with nutritional consult.   2. HTN. Will continue losartan and metoprolol for blood pressure control. Patient euvolemic.   3. Dyslipidemia. Continue atorvastatin  4. NSTEMI. No current chest pain, echocardiography with LV ejection fraction 45-50%, with inferior and inferior septal hypokinesis to akinesis. Continue medical management with metoprolol, losartan and atorvastatin, holding asa or anticoagulation due to bloody pleural effusion. EKG personally reviewed noted sinus with premature atrial complexes, left axis and right bundle branch block, no st elevation or st depression, no significant T wave abnormalities.   5. Depression. Continue with lorazepam, citalopram.    DVT prophylaxis: scd  Code Status: DNR Family Communication: I spoke with patient's son at the bedside and all questions were addressed. Disposition Plan: home   Consultants:   Pulmonary   Procedures:     Antimicrobials:       Subjective: Patient feeling well, no dyspnea or chest pain, very weak and deconditioned.   Objective: Vitals:   06/04/17 2036 06/04/17 2325 06/05/17 0551 06/05/17 0753  BP:  (!) 127/106 (!) 92/54 (!) 120/51  Pulse: (!) 103 (!) 108 (!) 111   Resp: 17  18 (!) 29 (!) 21  Temp:  98.5  F (36.9 C) 97.8 F (36.6 C) 97.7 F (36.5 C)  TempSrc:  Oral Oral Oral  SpO2: 96% 96% 96%   Weight:   84.3 kg (185 lb 13.6 oz)   Height:        Intake/Output Summary (Last 24 hours) at 06/05/2017 1227 Last data filed at 06/05/2017 0552 Gross per 24 hour  Intake -  Output 600 ml  Net -600 ml   Filed Weights   06/03/17 0448 06/04/17 0559 06/05/17 0551  Weight: 83.4 kg (183 lb 13.8 oz) 80.3 kg (177 lb 0.5 oz) 84.3 kg (185 lb 13.6 oz)    Examination:   General: Not in pain or dyspnea, deconditioned Neurology: Awake and alert, non focal  E ENT: mild pallor, no icterus, oral mucosa moist Cardiovascular: No JVD. S1-S2 present, rhythmic, no gallops, rubs, or murmurs. Non pitting  lower extremity edema. Pulmonary: vesicular breath sounds bilaterally, adequate air movement, no wheezing, rhonchi or rales. Mild decreases breath sounds at bases.  Gastrointestinal. Abdomen protuberant, no organomegaly, non tender, no rebound or guarding Skin. No rashes Musculoskeletal: no joint deformities     Data Reviewed: I have personally reviewed following labs and imaging studies  CBC: Recent Labs  Lab 06/29/2017 0710 06/03/17 0246 06/03/17 1900 06/04/17 0305 06/05/17 0920  WBC 21.3* 15.1* 15.3* 14.0* 20.3*  NEUTROABS  --   --  12.5*  --   --   HGB 10.2* 9.1* 9.1* 8.5* 10.3*  HCT 32.4* 29.2* 29.3* 27.4* 33.8*  MCV 93.1 94.8 95.8 96.1 94.2  PLT 247 227 215 195 132   Basic Metabolic Panel: Recent Labs  Lab 06/07/2017 0710 06/03/17 0246 06/04/17 0305  NA 142 141 142  K 4.5 5.0 5.0  CL 108 111 114*  CO2 20* 22 20*  GLUCOSE 146* 98 82  BUN 38* 38* 40*  CREATININE 1.64* 1.65* 1.57*  CALCIUM 8.9 8.2* 8.0*   GFR: Estimated Creatinine Clearance: 24 mL/min (A) (by C-G formula based on SCr of 1.57 mg/dL (H)). Liver Function Tests: No results for input(s): AST, ALT, ALKPHOS, BILITOT, PROT, ALBUMIN in the last 168 hours. No results for input(s): LIPASE, AMYLASE in the last 168 hours. No  results for input(s): AMMONIA in the last 168 hours. Coagulation Profile: Recent Labs  Lab 06/03/17 0246  INR 1.19   Cardiac Enzymes: Recent Labs  Lab 06/29/2017 1053 06/03/2017 1741 06/15/2017 2251  TROPONINI 1.56* 1.38* 1.57*   BNP (last 3 results) No results for input(s): PROBNP in the last 8760 hours. HbA1C: No results for input(s): HGBA1C in the last 72 hours. CBG: Recent Labs  Lab 06/03/17 1137  GLUCAP 107*   Lipid Profile: Recent Labs    06/03/17 0246  CHOL 109  HDL 53  LDLCALC 34  TRIG 111  CHOLHDL 2.1   Thyroid Function Tests: No results for input(s): TSH, T4TOTAL, FREET4, T3FREE, THYROIDAB in the last 72 hours. Anemia Panel: No results for input(s): VITAMINB12, FOLATE, FERRITIN, TIBC, IRON, RETICCTPCT in the last 72 hours.    Radiology Studies: I have reviewed all of the imaging during this hospital visit personally     Scheduled Meds: . atorvastatin  10 mg Oral q1800  . chlorhexidine  15 mL Mouth Rinse BID  . citalopram  40 mg Oral Daily  . imipramine  100 mg Oral QHS  . latanoprost  1 drop Both Eyes QHS  . LORazepam  1 mg Oral QHS  . losartan  100 mg  Oral Daily  . mouth rinse  15 mL Mouth Rinse q12n4p  . metoprolol tartrate  12.5 mg Oral BID  . traZODone  200 mg Oral QHS   Continuous Infusions: . azithromycin 500 mg (06/05/17 1030)  . cefTRIAXone (ROCEPHIN)  IV Stopped (06/05/17 0920)     LOS: 3 days        Mauricio Gerome Apley, MD [facility] Pager [phone]

## 2017-06-05 NOTE — Progress Notes (Signed)
IP PROGRESS NOTE  Subjective:  No acute events overnight.  Chest tube draining minimal amount of liquid.  Objective: Vital signs in last 24 hours: Blood pressure (!) 120/51, pulse (!) 111, temperature 97.7 F (36.5 C), temperature source Oral, resp. rate (!) 21, height 5' 3"  (1.6 m), weight 185 lb 13.6 oz (84.3 kg), SpO2 96 %.  Intake/Output from previous day: 02/03 0701 - 02/04 0700 In: -  Out: 600 [Urine:600]  Physical Exam: Very pleasant elderly female who does not appear to be in acute respiratory distress at this time.  Fully interactive and oriented x3. HEENT: Anicteric sclera, moist mucous membranes, PERRLA. Lungs: Decreased air entry into the lungs bilaterally without expiratory wheezing.  Chest tube in place with small amount of serosanguineous fluid drainage Cardiac: Irregularly irregular heart rate, no significant tachycardia or murmurs.  Mild peripheral edema present Abdomen: Soft, nontender nondistended.  No hepatosplenomegaly, no palpable masses Lymph nodes: No palpable lymphadenopathy in the cervical, supraclavicular, or axillary areas Neurologic: No gross focal neurological deficits Skin: Pale, dry.  No petechiae or ecchymosis.  No rashes Musculoskeletal: Unremarkable   Lab Results: Recent Labs    06/03/17 1900 06/04/17 0305  WBC 15.3* 14.0*  HGB 9.1* 8.5*  HCT 29.3* 27.4*  PLT 215 195    BMET Recent Labs    06/03/17 0246 06/04/17 0305  NA 141 142  K 5.0 5.0  CL 111 114*  CO2 22 20*  GLUCOSE 98 82  BUN 38* 40*  CREATININE 1.65* 1.57*  CALCIUM 8.2* 8.0*    No results found for: CEA1  Studies/Results: Ct Chest Wo Contrast  Result Date: 06/03/2017 CLINICAL DATA:  Moderate to large left pleural effusion on a portable chest earlier today. EXAM: CT CHEST WITHOUT CONTRAST TECHNIQUE: Multidetector CT imaging of the chest was performed following the standard protocol without IV contrast. COMPARISON:  Portable chest obtained earlier today. Chest CT  report dated 03/15/2001. FINDINGS: Cardiovascular: Atheromatous calcifications, including the coronary arteries and aorta. Normal sized heart. Enlarged central pulmonary arteries. The right main pulmonary artery has a maximum diameter of 3.1 cm and the left main pulmonary artery has a maximum diameter of 2.5 cm. The main pulmonary artery segment has a diameter of 2.9 cm. Mediastinum/Nodes: Mildly lobulated thyroid contours with mild enlargement of the right lobe and a 7 mm low density nodule in the posterior aspect of the right lobe. There is also a hyperdense nodule arising from the inferior aspect of the thyroid gland at the isthmus on the left, measuring 1.4 cm in maximum diameter on image number 29 of series 3. Mildly prominent low density subcarinal node and similar-appearing adjacent right hilar node. The subcarinal node has a short axis diameter 9 mm on image number 75 of series 3 in the hilar node has a short axis diameter of 9 mm on image number 79 of series 3. Lungs/Pleura: Again demonstrated is a moderate to large-sized left pleural effusion with adjacent atelectasis in the left upper lobe and left lower lobe. The atelectatic left lower lobe contains multiple calcific densities and there are small punctate calcific densities in the atelectatic left upper lobe. The bronchial walls are calcified and no endobronchial masses are identified. There is a rind of soft tissue thickening at the left lung apex anteriorly and medially, measuring 2.3 cm in thickness on image number 27 of series 3. Triangular-shaped area of focal, dense lung consolidation in the posterolateral aspect of the right middle lobe. Small amount of linear density in the right upper  lobe. There is also a focal area of ground-glass interstitial opacity in the posterior aspect of the right upper lobe, measuring 3.9 x 2.3 cm on image number 28 of series 4. Upper Abdomen: Poorly defined right lobe liver mass measuring approximately 4.8 x 4.2 cm on  image number 118 of series 3. The spleen is displaced medially and anteriorly by the left pleural fluid. No free peritoneal fluid is seen. Musculoskeletal: Extensive thoracic spine degenerative changes. Bilateral shoulder degenerative changes. IMPRESSION: 1. Thick rind of soft tissue at the left lung apex anteriorly and medially, suspicious for pleural based malignancy. 2. Moderate to large left pleural effusion with mass effect on the upper abdomen, suspicious for a malignant effusion. 3. Compressive atelectasis of the left upper lobe and left lower lobe. 4. 3.9 x 2.3 cm focal ground-glass opacity in the right upper lobe. This could be infectious, inflammatory or neoplastic in nature. 5. Focal atelectasis common pneumonia or pulmonary infarction in the posterolateral aspect of the right middle lobe. 6. Mild mediastinal and right hilar adenopathy. This could represent metastatic or reactive adenopathy. 7. Prominent central pulmonary arteries, mainly involving the right main pulmonary artery. This can be seen with pulmonary arterial hypertension. 8. Poorly defined right lobe liver mass measuring approximately 4.8 x 4.2 cm. This is suspicious for a metastasis. A primary liver neoplasm is also a possibility. 9. 1.4 cm hyperdense exophytic nodule arising from the isthmus of the thyroid gland on the left. Consider further evaluation with thyroid ultrasound. If patient is clinically hyperthyroid, consider nuclear medicine thyroid uptake and scan. 10.  Calcific coronary artery and aortic atherosclerosis. Aortic Atherosclerosis (ICD10-I70.0). Electronically Signed   By: Claudie Revering M.D.   On: 06/03/2017 15:00   Dg Chest Port 1 View  Result Date: 06/04/2017 CLINICAL DATA:  Pneumothorax. EXAM: PORTABLE CHEST 1 VIEW COMPARISON:  06/03/2017 FINDINGS: Left pigtail pleural catheter remains in place. The cardiomediastinal silhouette is unchanged. No definite residual left pneumothorax is identified. There is a persistent small  left pleural effusion, and there is pleural thickening at the lung apex. Right lateral costophrenic angle blunting is unchanged without evidence of a sizable pleural effusion. Patchy left basilar airspace opacity is unchanged. The right lung remains clear. IMPRESSION: 1. No residual pneumothorax identified. 2. Unchanged small left pleural effusion and left basilar atelectasis. Electronically Signed   By: Logan Bores M.D.   On: 06/04/2017 10:03   Dg Chest Port 1 View  Result Date: 06/03/2017 CLINICAL DATA:  Chest tube placement for large left pleural effusion. EXAM: PORTABLE CHEST 1 VIEW 5:12 p.m.: COMPARISON:  CT chest and portable chest x-ray earlier same day. FINDINGS: Pigtail catheter placed into the left pleural space with marked improvement in the left pleural effusion since earlier today. There is a small (approximate 5-10%) left apicolateral pneumothorax. A small to moderate-sized effusion persists at the base of the left hemithorax, with associated consolidation involving the left lower lobe. Right lung remains clear with mild pleuroparenchymal scarring at the right base accounting for the blunted costophrenic angle as demonstrated on the CT earlier today. IMPRESSION: 1. Marked reduction in size of the left pleural effusion after chest tube placement. A small (approximate 5-10%) left apicolateral pneumothorax is present. 2. Residual small to moderate-sized left pleural effusion with associated passive atelectasis and/or pneumonia involving the left lower lobe. These results will be called to the ordering clinician or representative by the Radiologist Assistant, and communication documented in the PACS or zVision Dashboard. Electronically Signed   By: Evangeline Dakin  M.D.   On: 06/03/2017 17:57   Dg Chest Port 1 View  Result Date: 06/03/2017 CLINICAL DATA:  Pleural effusion EXAM: PORTABLE CHEST 1 VIEW COMPARISON:  06/17/2017 FINDINGS: Moderate to large left pleural effusion. Left mid/lower lung is  obscured with suspected compressive atelectasis. Right lung is essentially clear. Cardiomediastinal silhouette is obscured. No pneumothorax. IMPRESSION: Moderate to large left pleural effusion, grossly unchanged. Electronically Signed   By: Julian Hy M.D.   On: 06/03/2017 09:29   US Abdomen Limited Ruq  Result Date: 06/04/2017 CLINICAL DATA:  Abnormal liver CT EXAM: ULTRASOUND ABDOMEN LIMITED RIGHT UPPER QUADRANT COMPARISON:  CT chest 06/03/2017 FINDINGS: Gallbladder: No gallstones or wall thickening visualized. No sonographic Murphy sign noted by sonographer. Common bile duct: Diameter: 4.2 mm Liver: At least 2 hypoechoic solid masses are visualized within the right hepatic lobe. The more posterior mass measures 3.8 x 3.4 x 4.5 cm and corresponds to the CT demonstrated mass. Smaller anterior mass measures 1.8 x 1.8 x 2.5 cm. Portal vein is patent on color Doppler imaging with normal direction of blood flow towards the liver. IMPRESSION: 1. Negative for gallstones or biliary dilatation 2. At least 2 solid-appearing hypoechoic masses in the right lobe of liver, findings would be concerning for metastatic disease Electronically Signed   By: Donavan Foil M.D.   On: 06/04/2017 22:59    Medications: I have reviewed the patient's current medications.  Assessment/Plan: 84 y.o. female with history of right-sided invasive ductal carcinoma 10 years ago, stage I, hormone-receptor positive, HER-2 negative who was treated with lumpectomy followed by adjuvant endocrine therapy with tamoxifen followed by aromatase inhibitor.  Patient has completed a full course of treatment at that time.  Currently presenting with suspected malignancy involving her lung, pleural space, and liver.  The exact nature of the malignancy is unclear.  Pleural fluid has been collected and submitted for analysis.  Differential diagnosis includes delayed recurrence of low-grade breast cancer versus the novel malignancy of unknown  primary.  Recommendations: -Still awaiting formal pathology report regarding the presence of malignancy and pleural fluid.. -No additional studies necessary at this point in time from oncological standpoint. -Disposition at the discretion of the primary treating service.  I will support patient's family request for hospice consultation.  I would like to follow-up with the patient and her family 1 week after discharge if she leaves before pathology results are available..    LOS: 3 days   Ardath Sax, MD   06/05/2017, 8:21 AM

## 2017-06-05 NOTE — Progress Notes (Signed)
Spoke with Ester in infection prevention to clarify if okay for pt to come off Airborne precautions.  Pt has not had AFBs ordered to r/o TB. Pt does have a quantiferon-TB gold lab pending results.    MD has ordered pt to have airborne precautions d/c'd, infection prevention nurse states that pt can come off precautions.    Precaution signs removed from pt door, pt informed of precautions d/c'd.

## 2017-06-06 ENCOUNTER — Inpatient Hospital Stay (HOSPITAL_COMMUNITY): Payer: Medicare Other

## 2017-06-06 LAB — CBC
HEMATOCRIT: 33.7 % — AB (ref 36.0–46.0)
HEMOGLOBIN: 10.7 g/dL — AB (ref 12.0–15.0)
MCH: 29.6 pg (ref 26.0–34.0)
MCHC: 31.8 g/dL (ref 30.0–36.0)
MCV: 93.1 fL (ref 78.0–100.0)
Platelets: 201 10*3/uL (ref 150–400)
RBC: 3.62 MIL/uL — AB (ref 3.87–5.11)
RDW: 16 % — ABNORMAL HIGH (ref 11.5–15.5)
WBC: 22.5 10*3/uL — AB (ref 4.0–10.5)

## 2017-06-06 LAB — BASIC METABOLIC PANEL
ANION GAP: 11 (ref 5–15)
BUN: 23 mg/dL — ABNORMAL HIGH (ref 6–20)
CHLORIDE: 109 mmol/L (ref 101–111)
CO2: 20 mmol/L — AB (ref 22–32)
Calcium: 8.6 mg/dL — ABNORMAL LOW (ref 8.9–10.3)
Creatinine, Ser: 1.13 mg/dL — ABNORMAL HIGH (ref 0.44–1.00)
GFR calc Af Amer: 48 mL/min — ABNORMAL LOW (ref >60)
GFR calc non Af Amer: 41 mL/min — ABNORMAL LOW (ref >60)
GLUCOSE: 129 mg/dL — AB (ref 65–99)
POTASSIUM: 4.4 mmol/L (ref 3.5–5.1)
Sodium: 140 mmol/L (ref 135–145)

## 2017-06-06 MED ORDER — BOOST / RESOURCE BREEZE PO LIQD CUSTOM
1.0000 | Freq: Three times a day (TID) | ORAL | Status: DC
  Administered 2017-06-06 – 2017-06-07 (×2): 1 via ORAL

## 2017-06-06 NOTE — Consult Note (Signed)
           [facility] CM Primary Care Navigator  06/06/2017  Stephanie Dickerson Sep 11, 1923 [id number]   Seen patient at the bedside. Patient is alert and verbal but was unable to answer questions accurately. Called and spoke to son Stephanie Dickerson) to identify possible discharge needs of patient.  Son verbalized that patient had "labored breathing and flu-like symptoms" thathad ledto this admission.  Son endorses Dr. Colin Benton with [facility] [place] as the primary care provider.   Patient'sson shared using [facility] on [address] obtain medications without any problem so far.  Son reports managing patient's medications at home using "pill box" system filled once a week.  Patient's son has been providing transportation to her doctors'appointments.  Patient lives with son who serves as the primary caregiver for patient.  Anticipated discharge planisSNF (skilled nursing facility- in process) for rehabilitation per therapyrecommendation prior to returning home.  Patient's son voiced understanding to callprimary care provider's office when she returns back hometo schedulea post discharge follow-upappointmentwithin1-2 weeksor sooner if needed. Patient letter (with PCP's contact number) was provided at the bedside as areminder and son was made aware of it.   Discussed withpatient's sonabout [facility] CM services available for healthmanagementand resources at home.  Son expressed understanding of needto seekreferral to [facility] care managementfromprimary care provideras deemed necessary andappropriate foranyservices in thefuture- once discharged home.  [facility] care management information provided for future needs thatpatientmay have.   For questions, please contact:  Dannielle Huh, BSN, RN- [facility] Primary Care Navigator  Telephone: [phone] [place]

## 2017-06-06 NOTE — Progress Notes (Addendum)
Initial Nutrition Assessment  DOCUMENTATION CODES:   Obesity unspecified  INTERVENTION:    Boost Breeze po TID, each supplement provides 250 kcal and 9 grams of protein  NUTRITION DIAGNOSIS:   Inadequate oral intake related to poor appetite as evidenced by per patient/family report  GOAL:   Patient will meet greater than or equal to 90% of their needs  MONITOR:   PO intake, Supplement acceptance, Labs, Skin, Weight trends  REASON FOR ASSESSMENT:   Consult Assessment of nutrition requirement/status  ASSESSMENT:   84 yo Female who presented with worsening dyspnea. Patient does have a significant PMH for HTN, dyslipidemia history of breast cancer and depression. Patient was found with significant respiratory distress while lying in her bed, she was confused and lethargic. Pt with NSTEMI and L para pneumonic pleural effusion.    RD spoke with pt's son, Clair Gulling outside of pt's room. Pt sleeping. Clair Gulling reports pt's appetite had picked up with fluid removal (chest tube). Now he states she is back to not eating well. She has also been nauseous.   Pt is lactose intolerant. Son feels pt would possibly take [employer]. Son believes pt has lost weight, however, unable to quantify amount/time frame. Labs and medications reviewed. CBG's [id number].  NUTRITION - FOCUSED PHYSICAL EXAM:  Unable to complete at this time.  Diet Order:  Diet Heart Room service appropriate? Yes; Fluid consistency: Thin  EDUCATION NEEDS:   No education needs have been identified at this time  Skin:  Skin Assessment: Reviewed RN Assessment  Last BM:  2/4  Height:   Ht Readings from Last 1 Encounters:  06/19/2017 5\' 3"  (1.6 m)   Weight:   Wt Readings from Last 1 Encounters:  06/06/17 182 lb 5.1 oz (82.7 kg)   Ideal Body Weight:  52.2 kg  BMI:  Body mass index is 32.3 kg/m.  Estimated Nutritional Needs:   Kcal:  1600-1800  Protein:  80-95 gm  Fluid:  1.6-1.8 L  Arthur Holms, RD,  LDN Pager #: [phone] After-Hours Pager #: [phone]

## 2017-06-06 NOTE — Progress Notes (Signed)
PROGRESS NOTE    Stephanie Dickerson  ION:[id number] DOB: 1924/02/08 DOA: 06/26/2017 PCP: Lucretia Kern, DO    Brief Narrative:  84 year old female who presented with worsening dyspnea. Patient does have a significant past medical history for hypertension, dyslipidemia history of breast cancer and depression. Patient was found with significant respiratory distress while lying in her bed, she was confused and lethargic. Apparently she had developed worsening dyspnea for last 3 to 4 days that initially was on exertion, and preceded by an upper respiratory tract infection, that was diagnosed about 2 weeks ago.   Her functional physical capacity has been significantly decreased after a hamstring tear.   On initial physical examination she was in acute respiratory distress, her oxygen saturation was 75% and she was placed on noninvasive mechanical ventilation. Blood pressure 126/88, heart rate 109, respiratory 29, temperature 97.4, oximetry 98%, on supplemental oxygen. Patient was confused, coarse breath sounds bilaterally, heart S1-S2 present tachycardic, abdomen soft nontender, no lower extremity edema. Sodium 142, potassium 4.5, chloride 108, bicarbonate 20, glucose 146, BUN 38, creatinine 1.64, calcium 8.9, troponin 0.95, white count 21.3, hemoglobin 10.2, hematocrit 32.4, platelets 247, chest x-ray was rotated to the right side, with a large left pleural effusion. EKG sinus rhythm, rate 84 bpm, left axis deviation, right bundle branch block.   Patient was admitted to the stepdown unit with working diagnosis acute hypoxic respiratory failure due to large left pleural effusion to rule out a para-pneumonic effusion complicating a community acquired pneumonia.   Assessment & Plan:   Principal Problem:   Acute respiratory failure (HCC) Active Problems:   Hyperlipidemia   Hypertension   NSTEMI (non-ST elevated myocardial infarction) ([place])   Pleural effusion   CAP (community acquired pneumonia)   Counseling regarding end of life decision making   Acute renal disease   Liver mass   1. Acute hypoxic respiratory failure due to left para-pneumonic pleural effusion, complicated with post pleural catheter pneumothorax. Patient with stable dyspnea, no chest pain or cough. Will continue antibiotic therapy with ceftriaxone and azithromycin to complete 5 days. Chest tube with 300 cc drainage over last 24 hours. Patient oxygenating well.   2. HTN. On losartan and metoprolol for blood pressure control, systolic 244 to 010 mmHg.   3. Dyslipidemia. On atorvastatin  4. NSTEMI. LV ejection fraction 45-50%, with inferior and inferior septal hypokinesis to akinesis. Medical management with metoprolol, losartan and atorvastatin, no anticoagulation due to bloody pleural effusion. Patient chest pain free, telemetry with no major events. Will dc telemetry and transfer to medicine ward. Out of bed as tolerated and physical therapy evaluation.    5. Depression. On lorazepam, citalopram, no confusion or agitation.    DVT prophylaxis: scd  Code Status: DNR Family Communication: I spoke with patient's son at the bedside and all questions were addressed. Disposition Plan: home   Consultants:   Pulmonary   Procedures:     Antimicrobials:    Subjective: Patient feeling better, dyspnea has improved, no chest pain or cough. Persistent lowe appetite and decreased po intake. No nausea or vomiting, no diarrhea.   Objective: Vitals:   06/06/17 0000 06/06/17 0100 06/06/17 0307 06/06/17 0812  BP: (!) 114/55  (!) 115/57 120/73  Pulse: 73  91 78  Resp:  16 14 (!) 23  Temp:   98.3 F (36.8 C) 98.4 F (36.9 C)  TempSrc:   Oral Oral  SpO2: 100% 100% 98% 100%  Weight:   82.7 kg (182 lb 5.1 oz)  Height:        Intake/Output Summary (Last 24 hours) at 06/06/2017 0814 Last data filed at 06/05/2017 2054 Gross per 24 hour  Intake 600 ml  Output 612 ml  Net -12 ml   Filed Weights    06/04/17 0559 06/05/17 0551 06/06/17 0307  Weight: 80.3 kg (177 lb 0.5 oz) 84.3 kg (185 lb 13.6 oz) 82.7 kg (182 lb 5.1 oz)    Examination:   General: deconditioned.  Neurology: Awake and alert, non focal  E ENT: mild pallor, no icterus, oral mucosa moist Cardiovascular: No JVD. S1-S2 present, rhythmic, no gallops, rubs, or murmurs. No lower extremity edema. Pulmonary: vesicular breath sounds bilaterally, adequate air movement, no wheezing, rhonchi or rales. Decreased breath sounds at bases, chest tube in place on the left.  Gastrointestinal. Abdomen protuberant, no organomegaly, non tender, no rebound or guarding Skin. No rashes Musculoskeletal: no joint deformities     Data Reviewed: I have personally reviewed following labs and imaging studies  CBC: Recent Labs  Lab 06/03/17 0246 06/03/17 1900 06/04/17 0305 06/05/17 0920 06/06/17 0337  WBC 15.1* 15.3* 14.0* 20.3* 22.5*  NEUTROABS  --  12.5*  --   --   --   HGB 9.1* 9.1* 8.5* 10.3* 10.7*  HCT 29.2* 29.3* 27.4* 33.8* 33.7*  MCV 94.8 95.8 96.1 94.2 93.1  PLT 227 215 195 200 161   Basic Metabolic Panel: Recent Labs  Lab 06/25/2017 0710 06/03/17 0246 06/04/17 0305 06/06/17 0337  NA 142 141 142 140  K 4.5 5.0 5.0 4.4  CL 108 111 114* 109  CO2 20* 22 20* 20*  GLUCOSE 146* 98 82 129*  BUN 38* 38* 40* 23*  CREATININE 1.64* 1.65* 1.57* 1.13*  CALCIUM 8.9 8.2* 8.0* 8.6*   GFR: Estimated Creatinine Clearance: 33 mL/min (A) (by C-G formula based on SCr of 1.13 mg/dL (H)). Liver Function Tests: No results for input(s): AST, ALT, ALKPHOS, BILITOT, PROT, ALBUMIN in the last 168 hours. No results for input(s): LIPASE, AMYLASE in the last 168 hours. No results for input(s): AMMONIA in the last 168 hours. Coagulation Profile: Recent Labs  Lab 06/03/17 0246  INR 1.19   Cardiac Enzymes: Recent Labs  Lab 06/26/2017 1053 06/08/2017 1741 06/17/2017 2251  TROPONINI 1.56* 1.38* 1.57*   BNP (last 3 results) No results for  input(s): PROBNP in the last 8760 hours. HbA1C: No results for input(s): HGBA1C in the last 72 hours. CBG: Recent Labs  Lab 06/03/17 1137  GLUCAP 107*   Lipid Profile: No results for input(s): CHOL, HDL, LDLCALC, TRIG, CHOLHDL, LDLDIRECT in the last 72 hours. Thyroid Function Tests: No results for input(s): TSH, T4TOTAL, FREET4, T3FREE, THYROIDAB in the last 72 hours. Anemia Panel: No results for input(s): VITAMINB12, FOLATE, FERRITIN, TIBC, IRON, RETICCTPCT in the last 72 hours.    Radiology Studies: I have reviewed all of the imaging during this hospital visit personally     Scheduled Meds: . atorvastatin  10 mg Oral q1800  . chlorhexidine  15 mL Mouth Rinse BID  . citalopram  40 mg Oral Daily  . imipramine  100 mg Oral QHS  . latanoprost  1 drop Both Eyes QHS  . LORazepam  1 mg Oral QHS  . losartan  100 mg Oral Daily  . mouth rinse  15 mL Mouth Rinse q12n4p  . metoprolol tartrate  12.5 mg Oral BID  . traZODone  200 mg Oral QHS   Continuous Infusions: . azithromycin Stopped (06/05/17 1130)  . cefTRIAXone (ROCEPHIN)  IV Stopped (06/05/17 0920)     LOS: 4 days        Naomie Crow Gerome Apley, MD [facility] Pager [phone]

## 2017-06-06 NOTE — Care Management Important Message (Signed)
Important Message  Patient Details  Name: Stephanie Dickerson MRN: [id number] Date of Birth: 09/11/1923   Medicare Important Message Given:  Yes    Orbie Pyo 06/06/2017, 1:57 PM

## 2017-06-06 NOTE — Evaluation (Signed)
Physical Therapy Evaluation Patient Details Name: Stephanie Dickerson MRN: [id number] DOB: [birth year] Today's Date: 06/06/2017   History of Present Illness  84 year old female who presented with worsening dyspnea. Patient does have a significant past medical history for hypertension, dyslipidemia history of breast cancer and depression. Patient was found with significant respiratory distress while lying in her bed, she was confused and lethargic. Pt with NSTEMI and L para pneumonic pleural effusion.   Clinical Impression  Pt admitted with above diagnosis. Pt currently with functional limitations due to the deficits listed below (see PT Problem List). Pt lethargic today with eyes closed most of session. Max A +2 for supine to sit. Pt with heavy left lean in initial sitting. Attempted standing 3x but pt unable to clear bed. However, was able to maintain midline sitting after standing attempts. Pt became nauseous while sitting up and was returned to supine.  Pt will benefit from skilled PT to increase their independence and safety with mobility to allow discharge to the venue listed below.       Follow Up Recommendations SNF;Supervision/Assistance - 24 hour    Equipment Recommendations  None recommended by PT    Recommendations for Other Services       Precautions / Restrictions Precautions Precautions: Fall Restrictions Weight Bearing Restrictions: No Other Position/Activity Restrictions: chest tube      Mobility  Bed Mobility Overal bed mobility: Needs Assistance Bed Mobility: Supine to Sit;Sit to Supine     Supine to sit: +2 for physical assistance;Max assist Sit to supine: +2 for physical assistance;Total assist   General bed mobility comments: painful RLE with moving to EOB. Pt followed commands to grasp rail of bed, max A to roll to L and LE's off bed with trunk elevation. Tot A for return to supine.  Transfers Overall transfer level: Needs assistance Equipment used: 2  person hand held assist Transfers: Sit to/from Stand Sit to Stand: +2 physical assistance;Max assist         General transfer comment: 3 attempted stands EOB but pt unable to clear buttocks even with facilitation of pad under hips. Pt very fatigued with the effort and then became nauseous. VSS. Pt's sitting balance was improved after standing attempts though.   Ambulation/Gait             General Gait Details: unable  Stairs            Wheelchair Mobility    Modified Rankin (Stroke Patients Only)       Balance Overall balance assessment: Needs assistance Sitting-balance support: Feet supported;Bilateral upper extremity supported Sitting balance-Leahy Scale: Poor Sitting balance - Comments: L lean. Min A needed to maintain sitting EOB. After standing attempts was able to maintain midline with min-guard A Postural control: Left lateral lean                                   Pertinent Vitals/Pain Pain Assessment: Faces Faces Pain Scale: Hurts even more Pain Location: R upper leg with movement Pain Descriptors / Indicators: Aching Pain Intervention(s): Limited activity within patient's tolerance;Monitored during session    Home Living Family/patient expects to be discharged to:: Skilled nursing facility Living Arrangements: Children                    Prior Function Level of Independence: Needs assistance   Gait / Transfers Assistance Needed: prior to Nov when she tore her R hamstring,  she was independent with ambulation. Minimal ambulation since that time and has been using RW  ADL's / Homemaking Assistance Needed: did not need assist before Nov, has needed assist with bathing and dressing since then  Comments: she lives with her son and he reports that at baseline she is full intact cognitively and quite a pistol. They know that she does have cancer though and is agreeable to SNF but wants to get her home before"the end" to be with him and  other family     Hand Dominance        Extremity/Trunk Assessment   Upper Extremity Assessment Upper Extremity Assessment: Generalized weakness    Lower Extremity Assessment Lower Extremity Assessment: Generalized weakness;RLE deficits/detail RLE Deficits / Details: hip flex <2/5, knee ext 2/5, painful anterior and posterior leg above the knee    Cervical / Trunk Assessment Cervical / Trunk Assessment: Kyphotic  Communication   Communication: No difficulties  Cognition Arousal/Alertness: Lethargic Behavior During Therapy: Flat affect Overall Cognitive Status: Impaired/Different from baseline Area of Impairment: Following commands                       Following Commands: Follows one step commands with increased time       General Comments: difficult to assess due to lethargy, kept eyes closed most of the time even when sitting.       General Comments      Exercises General Exercises - Lower Extremity Ankle Circles/Pumps: AROM;Both;10 reps;Seated Long Arc Quad: AROM;Both;5 reps;Seated   Assessment/Plan    PT Assessment Patient needs continued PT services  PT Problem List Decreased strength;Decreased range of motion;Decreased activity tolerance;Decreased balance;Decreased mobility;Decreased coordination;Decreased cognition;Pain       PT Treatment Interventions DME instruction;Gait training;Functional mobility training;Therapeutic activities;Therapeutic exercise;Balance training;Neuromuscular re-education;Patient/family education    PT Goals (Current goals can be found in the Care Plan section)  Acute Rehab PT Goals Patient Stated Goal: patient wants to go home. Pt agreeable to ST SNF to get her to the place where he can take her home PT Goal Formulation: With patient/family Time For Goal Achievement: 06/20/17 Potential to Achieve Goals: Fair    Frequency Min 2X/week   Barriers to discharge        Co-evaluation               AM-PAC PT "6  Clicks" Daily Activity  Outcome Measure Difficulty turning over in bed (including adjusting bedclothes, sheets and blankets)?: Unable Difficulty moving from lying on back to sitting on the side of the bed? : Unable Difficulty sitting down on and standing up from a chair with arms (e.g., wheelchair, bedside commode, etc,.)?: Unable Help needed moving to and from a bed to chair (including a wheelchair)?: Total Help needed walking in hospital room?: Total Help needed climbing 3-5 steps with a railing? : Total 6 Click Score: 6    End of Session Equipment Utilized During Treatment: Oxygen Activity Tolerance: Patient limited by lethargy;Patient limited by fatigue;Other (comment)(nausea) Patient left: in bed;with call bell/phone within reach;with family/visitor present Nurse Communication: Mobility status;Other (comment)(nausea) PT Visit Diagnosis: Muscle weakness (generalized) (M62.81);Pain;Difficulty in walking, not elsewhere classified (R26.2) Pain - Right/Left: Right Pain - part of body: Leg    Time: 8676-1950 PT Time Calculation (min) (ACUTE ONLY): 29 min   Charges:   PT Evaluation $PT Eval Moderate Complexity: 1 Mod PT Treatments $Therapeutic Activity: 8-22 mins   PT G Codes:        [employer],  PT  Acute Rehab Services  [place] 06/06/2017, 10:21 AM

## 2017-06-06 NOTE — NC FL2 (Signed)
[place] LEVEL OF CARE SCREENING TOOL     IDENTIFICATION  Patient Name: Stephanie Dickerson Birthdate: [birth year] Sex: female Admission Date (Current Location): 06/22/2017  [facility] and [place] Number:  Herbalist and Address:  [facility] [place] [place] [address] [place], [place] [address]      Provider Number: [id number]  Attending Physician Name and Address:  Tawni Millers,*  Relative Name and Phone Number:  Romana Deaton, [phone]    Current Level of Care: Hospital Recommended Level of Care: [place] Prior Approval Number:    Date Approved/Denied:   PASRR Number: [id number]  Discharge Plan: SNF    Current Diagnoses: Patient Active Problem List   Diagnosis Date Noted  . Liver mass   . Acute respiratory failure ([place]) 06/09/2017  . NSTEMI (non-ST elevated myocardial infarction) ([place]) 06/17/2017  . Pleural effusion 06/04/2017  . CAP (community acquired pneumonia) 06/26/2017  . Counseling regarding end of life decision making 06/07/2017  . Acute renal disease 06/14/2017  . History of breast cancer in female 12/18/2015  . Dislocated IOL (intraocular lens), posterior 12/12/2013  . Osteopenia   . Arthritis   . Cancer ([place])   . Hyperlipidemia 02/02/2012  . Hypertension 02/02/2012  . Hyperglycemia 02/02/2012  . Constipation 02/02/2012  . Anxiety/Insomnia - followed by Debbe Bales, psych nurse 02/02/2012  . Osteoarthritis - followed by Dr. Lawana Chambers in Ortho 02/02/2012  . Breast cancer ([place]) 02/02/2012    Orientation RESPIRATION BLADDER Height & Weight     Self  O2(2L) Incontinent Weight: 182 lb 5.1 oz (82.7 kg) Height:  5\' 3"  (160 cm)(patient reported)  BEHAVIORAL SYMPTOMS/MOOD NEUROLOGICAL BOWEL NUTRITION STATUS      Continent Diet(heart room)  AMBULATORY STATUS COMMUNICATION OF NEEDS Skin   Extensive Assist(unable to ambulate at this time) Verbally Normal                        Personal Care Assistance Level of Assistance  Bathing, Feeding, Dressing Bathing Assistance: Maximum assistance Feeding assistance: Limited assistance Dressing Assistance: Maximum assistance     Functional Limitations Info  Sight, Hearing, Speech Sight Info: Adequate Hearing Info: Adequate Speech Info: Adequate    SPECIAL CARE FACTORS FREQUENCY  OT (By licensed OT), PT (By licensed PT)     PT Frequency: 5x wk OT Frequency: 5x wk            Contractures Contractures Info: Not present    Additional Factors Info  Allergies, Code Status Code Status Info: DNR Allergies Info: LACTOSE INTOLERANCE            Current Medications (06/06/2017):  This is the current hospital active medication list Current Facility-Administered Medications  Medication Dose Route Frequency Provider Last Rate Last Dose  . acetaminophen (TYLENOL) tablet 650 mg  650 mg Oral Q4H PRN Bonnell Public Tublu, MD   650 mg at 06/06/17 1125  . albuterol (PROVENTIL) (2.5 MG/3ML) 0.083% nebulizer solution 2.5 mg  2.5 mg Nebulization Q2H PRN Elwin Mocha, MD      . atorvastatin (LIPITOR) tablet 10 mg  10 mg Oral q1800 Vashti Hey, MD   10 mg at 06/05/17 1654  . azithromycin (ZITHROMAX) 500 mg in dextrose 5 % 250 mL IVPB  500 mg Intravenous Q24H Yopp, Amber C, RPH   Stopped at 06/06/17 1125  . cefTRIAXone (ROCEPHIN) 1 g in dextrose 5 % 50 mL IVPB  1 g Intravenous Q24H Yopp, Amber C,  RPH   Stopped at 06/06/17 1011  . chlorhexidine (PERIDEX) 0.12 % solution 15 mL  15 mL Mouth Rinse BID Bonnell Public Tublu, MD   15 mL at 06/06/17 1006  . citalopram (CELEXA) tablet 40 mg  40 mg Oral Daily Bonnell Public Tublu, MD   40 mg at 06/06/17 1007  . hydrALAZINE (APRESOLINE) injection 10 mg  10 mg Intravenous Q8H PRN Elwin Mocha, MD      . imipramine (TOFRANIL) tablet 100 mg  100 mg Oral QHS Bonnell Public Tublu, MD   100 mg at 06/05/17 2139  . latanoprost (XALATAN) 0.005 %  ophthalmic solution 1 drop  1 drop Both Eyes QHS Vashti Hey, MD   1 drop at 06/05/17 2140  . LORazepam (ATIVAN) tablet 1 mg  1 mg Oral QHS Bonnell Public Tublu, MD   1 mg at 06/05/17 2139  . losartan (COZAAR) tablet 100 mg  100 mg Oral Daily Bonnell Public Tublu, MD   100 mg at 06/06/17 1007  . MEDLINE mouth rinse  15 mL Mouth Rinse q12n4p Bonnell Public Tublu, MD   15 mL at 06/06/17 1125  . metoprolol tartrate (LOPRESSOR) tablet 12.5 mg  12.5 mg Oral BID Bonnell Public Tublu, MD   12.5 mg at 06/06/17 1007  . nitroGLYCERIN (NITROSTAT) SL tablet 0.4 mg  0.4 mg Sublingual Q5 Min x 3 PRN Bonnell Public Tublu, MD      . ondansetron [facility]) injection 4 mg  4 mg Intravenous Q6H PRN Vashti Hey, MD   4 mg at 06/06/17 1006  . polyethylene glycol (MIRALAX / GLYCOLAX) packet 17 g  17 g Oral Daily PRN Bonnell Public Tublu, MD      . traZODone (DESYREL) tablet 200 mg  200 mg Oral QHS Bonnell Public Tublu, MD   200 mg at 06/05/17 2139     Discharge Medications: Please see discharge summary for a list of discharge medications.  Relevant Imaging Results:  Relevant Lab Results:   Additional Information SS# [id number]  Wende Neighbors, LCSW

## 2017-06-06 NOTE — Clinical Social Work Note (Signed)
Clinical Social Work Assessment  Patient Details  Name: Stephanie Dickerson MRN: [id number] Date of Birth: 04/27/1924  Date of referral:  06/06/17               Reason for consult:  Discharge Planning, Facility Placement                Permission sought to share information with:  Family Supports Permission granted to share information::  Yes, Verbal Permission Granted  Name::     Stephanie Dickerson  Agency::  snf  Relationship::  son  Contact Information:  [phone]  Housing/Transportation Living arrangements for the past 2 months:  Single Family Home Source of Information:  Adult Children Patient Interpreter Needed:  None Criminal Activity/Legal Involvement Pertinent to Current Situation/Hospitalization:  No - Comment as needed Significant Relationships:  Adult Children, Other Family Members Lives with:  Adult Children Do you feel safe going back to the place where you live?  Yes Need for family participation in patient care:  Yes (Comment)  Care giving concerns:  Patients son at bedside. CSW unable to assess patient as she Is only orient to self and was asleep.   Social Worker assessment / plan:  CSW met patient and son at bedside. Son Clair Gulling) stated patient lives at home with him and at baseline patient is able to do most of her own ADL's. Clair Gulling stated that patient was very independent prior to being admitted in the hospital. Clair Gulling agreeable for patient to discharge to SNF. CSW has given Clair Gulling a list of facilities in the area. Clair Gulling stated he would like palliative to follow patient at SNF and at home for added resource. CSW to make MD aware of patients request.     Employment status:  Retired Forensic scientist:  Medicare PT Recommendations:  [place] / Referral to community resources:  [place]  Patient/Family's Response to care:  Patients son supportive of patient and her needs  Patient/Family's Understanding of and Emotional Response  to Diagnosis, Current Treatment, and Prognosis:  Family agreeable for patient to go to SNF for short term rehab. Clair Gulling stated he will look into facility and let CSW know of his decision   Emotional Assessment Appearance:  Appears stated age Attitude/Demeanor/Rapport:  Unable to Assess Affect (typically observed):  Unable to Assess Orientation:  Oriented to Self Alcohol / Substance use:  Not Applicable Psych involvement (Current and /or in the community):  No (Comment)  Discharge Needs  Concerns to be addressed:  No discharge needs identified Readmission within the last 30 days:  No Current discharge risk:  None Barriers to Discharge:  No Barriers Identified   Wende Neighbors, LCSW 06/06/2017, 2:35 PM

## 2017-06-07 ENCOUNTER — Inpatient Hospital Stay (HOSPITAL_COMMUNITY): Payer: Medicare Other

## 2017-06-07 DIAGNOSIS — R932 Abnormal findings on diagnostic imaging of liver and biliary tract: Secondary | ICD-10-CM

## 2017-06-07 DIAGNOSIS — R401 Stupor: Secondary | ICD-10-CM

## 2017-06-07 DIAGNOSIS — J939 Pneumothorax, unspecified: Secondary | ICD-10-CM

## 2017-06-07 DIAGNOSIS — J9600 Acute respiratory failure, unspecified whether with hypoxia or hypercapnia: Secondary | ICD-10-CM

## 2017-06-07 LAB — COMPREHENSIVE METABOLIC PANEL
ALBUMIN: 2.1 g/dL — AB (ref 3.5–5.0)
ALK PHOS: 73 U/L (ref 38–126)
ALT: 14 U/L (ref 14–54)
ANION GAP: 13 (ref 5–15)
AST: 23 U/L (ref 15–41)
BUN: 28 mg/dL — ABNORMAL HIGH (ref 6–20)
CALCIUM: 8.3 mg/dL — AB (ref 8.9–10.3)
CHLORIDE: 106 mmol/L (ref 101–111)
CO2: 20 mmol/L — AB (ref 22–32)
Creatinine, Ser: 1.37 mg/dL — ABNORMAL HIGH (ref 0.44–1.00)
GFR calc Af Amer: 38 mL/min — ABNORMAL LOW (ref >60)
GFR calc non Af Amer: 33 mL/min — ABNORMAL LOW (ref >60)
GLUCOSE: 107 mg/dL — AB (ref 65–99)
Potassium: 5.1 mmol/L (ref 3.5–5.1)
SODIUM: 139 mmol/L (ref 135–145)
Total Bilirubin: 0.4 mg/dL (ref 0.3–1.2)
Total Protein: 4.6 g/dL — ABNORMAL LOW (ref 6.5–8.1)

## 2017-06-07 LAB — CULTURE, BLOOD (ROUTINE X 2)
Culture: NO GROWTH
Culture: NO GROWTH
SPECIAL REQUESTS: ADEQUATE
Special Requests: ADEQUATE

## 2017-06-07 LAB — CBC WITH DIFFERENTIAL/PLATELET
BASOS ABS: 0 10*3/uL (ref 0.0–0.1)
Basophils Relative: 0 %
Eosinophils Absolute: 1 10*3/uL — ABNORMAL HIGH (ref 0.0–0.7)
Eosinophils Relative: 4 %
HEMATOCRIT: 35.6 % — AB (ref 36.0–46.0)
Hemoglobin: 11 g/dL — ABNORMAL LOW (ref 12.0–15.0)
LYMPHS ABS: 1 10*3/uL (ref 0.7–4.0)
Lymphocytes Relative: 4 %
MCH: 28.5 pg (ref 26.0–34.0)
MCHC: 30.9 g/dL (ref 30.0–36.0)
MCV: 92.2 fL (ref 78.0–100.0)
Monocytes Absolute: 2.6 10*3/uL — ABNORMAL HIGH (ref 0.1–1.0)
Monocytes Relative: 10 %
NEUTROS ABS: 21.1 10*3/uL — AB (ref 1.7–7.7)
Neutrophils Relative %: 82 %
Platelets: 172 10*3/uL (ref 150–400)
RBC: 3.86 MIL/uL — ABNORMAL LOW (ref 3.87–5.11)
RDW: 15.7 % — AB (ref 11.5–15.5)
WBC: 25.7 10*3/uL — ABNORMAL HIGH (ref 4.0–10.5)

## 2017-06-07 LAB — CBC
HCT: 36.9 % (ref 36.0–46.0)
HEMOGLOBIN: 11.4 g/dL — AB (ref 12.0–15.0)
MCH: 28.9 pg (ref 26.0–34.0)
MCHC: 30.9 g/dL (ref 30.0–36.0)
MCV: 93.4 fL (ref 78.0–100.0)
Platelets: 175 10*3/uL (ref 150–400)
RBC: 3.95 MIL/uL (ref 3.87–5.11)
RDW: 15.6 % — ABNORMAL HIGH (ref 11.5–15.5)
WBC: 27 10*3/uL — ABNORMAL HIGH (ref 4.0–10.5)

## 2017-06-07 LAB — QUANTIFERON-TB GOLD PLUS (RQFGPL)
QUANTIFERON TB2 AG VALUE: 0.25 [IU]/mL
QuantiFERON Mitogen Value: 0.28 IU/mL
QuantiFERON Nil Value: 0.28 IU/mL
QuantiFERON TB1 Ag Value: 0.26 IU/mL

## 2017-06-07 LAB — LACTIC ACID, PLASMA
LACTIC ACID, VENOUS: 1.2 mmol/L (ref 0.5–1.9)
Lactic Acid, Venous: 1.2 mmol/L (ref 0.5–1.9)

## 2017-06-07 LAB — PROCALCITONIN: Procalcitonin: 0.54 ng/mL

## 2017-06-07 LAB — QUANTIFERON-TB GOLD PLUS: QUANTIFERON-TB GOLD PLUS: UNDETERMINED

## 2017-06-07 LAB — AMMONIA: Ammonia: 22 umol/L (ref 9–35)

## 2017-06-07 MED ORDER — ONDANSETRON HCL 4 MG/2ML IJ SOLN
2.0000 mg | Freq: Once | INTRAMUSCULAR | Status: AC
  Administered 2017-06-07: 2 mg via INTRAVENOUS

## 2017-06-07 MED ORDER — CITALOPRAM HYDROBROMIDE 20 MG PO TABS: 20.0000 mg | ORAL_TABLET | Freq: Every day | ORAL | Status: DC

## 2017-06-07 MED ORDER — HYDROCODONE-ACETAMINOPHEN 5-325 MG PO TABS
1.0000 | ORAL_TABLET | Freq: Four times a day (QID) | ORAL | Status: DC | PRN
  Administered 2017-06-07: 2 via ORAL
  Filled 2017-06-07: qty 2

## 2017-06-07 MED ORDER — NALOXONE HCL 0.4 MG/ML IJ SOLN
0.4000 mg | Freq: Once | INTRAMUSCULAR | Status: AC
  Administered 2017-06-07: 0.4 mg via INTRAVENOUS

## 2017-06-07 MED ORDER — NALOXONE HCL 0.4 MG/ML IJ SOLN
INTRAMUSCULAR | Status: AC
  Filled 2017-06-07: qty 1

## 2017-06-07 MED ORDER — NALOXONE HCL 0.4 MG/ML IJ SOLN
0.4000 mg | Freq: Once | INTRAMUSCULAR | Status: AC
  Administered 2017-06-07: 0.4 mg via INTRAVENOUS
  Filled 2017-06-07: qty 1

## 2017-06-07 NOTE — Progress Notes (Signed)
Verbal order for 2mg  of zofran IV. Will continue to montior. Isac Caddy, RN

## 2017-06-07 NOTE — Progress Notes (Signed)
PROGRESS NOTE  VESPER TRANT DPO:[id number] DOB: [birth year] DOA: 06/15/2017 PCP: Lucretia Kern, DO  HPI/Recap of past 42 hours: 84 year old female who presented with worsening dyspnea. Patient with a significant past medical history for hypertension, dyslipidemia, history of breast cancer and depression. Patient was found with significant respiratory distress while lying in her bed, she was confused and lethargic. Apparently she had developed worsening dyspnea for last 3 to 4 days that initially was on exertion, and preceded by an upper respiratory tract infection, that was diagnosed about 2 weeks ago. Her functional physical capacity has been significantly decreased after a hamstring tear. On initial physical examination she was in acute respiratory distress, her oxygen saturation was 75% and she was placed on noninvasive mechanical ventilation. Patient was admitted to the stepdown unit with working diagnosis of acute hypoxic respiratory failure due to large left pleural effusion, likely para-pneumonic effusion complicating a community acquired pneumonia.  Today, pt noted to be more lethargic, unable to answer questions appropriately, ill-appearing, reports generalized pain. As per son, pt appears to be more sleepy, lethargic. Denies any L sided chest pain, worsening SOB, abdominal pain, fever/chills  Assessment/Plan: Principal Problem:   Acute respiratory failure (HCC) Active Problems:   Hyperlipidemia   Hypertension   NSTEMI (non-ST elevated myocardial infarction) ([place])   Pleural effusion   CAP (community acquired pneumonia)   Counseling regarding end of life decision making   Acute renal disease   Liver mass  Acute hypoxic respiratoryfailuredue to left para-pneumonic pleural effusion, complicated with post pleural catheter pneumothorax Ongoing, more lethargic today, no worsening SOB/cough Still on O2 [place] Afebrile with worsening leukocytosis CXR with resolved pneumothorax BC x  2 NGTD Pleural fluid culture and gram stain: NGTD, no wbc seen, Cytology pending Due to lethargy, LA, procalcitonin, CT head ordered, pending Continue antibiotic therapy with ceftriaxone and azithromycin to complete 5 days Chest tube still draining serosanguinous fluid Norco for pain management Critical care/oncology on board as there is suspicion of malignancy/mets  NSTEMI Chest pain free LV ejection fraction 45-50%, with inferior and inferior septal hypokinesis to akinesis Medical management with metoprolol, losartan and atorvastatin No anticoagulation due to bloody pleural effusion  HTN Stable Continue losartan and metoprolol  Dyslipidemia Continue atorvastatin  Depression Continue lorazepam, citalopram, trazodone for sleep     Code Status: DNR  Family Communication: Discussed with son and RN  Disposition Plan: TBD   Consultants:  PCCM  Oncology  Procedures:  Left chest tube placement on 06/03/17   Antimicrobials:  IV ceftriaxone  IV Azithromycin   DVT prophylaxis: SCDs   Objective: Vitals:   06/06/17 1556 06/06/17 1600 06/06/17 1952 06/07/17 0351  BP: (!) 106/56 (!) 104/50 115/62 (!) 109/59  Pulse: 73 69 (!) 109 92  Resp: 18 19 19  (!) 27  Temp: 97.7 F (36.5 C)  98.4 F (36.9 C) 98.4 F (36.9 C)  TempSrc: Oral  Oral Axillary  SpO2: 100% 100% 100% 100%  Weight:    79.6 kg (175 lb 7.8 oz)  Height:        Intake/Output Summary (Last 24 hours) at 06/07/2017 1705 Last data filed at 06/07/2017 0600 Gross per 24 hour  Intake -  Output 800 ml  Net -800 ml   Filed Weights   06/05/17 0551 06/06/17 0307 06/07/17 0351  Weight: 84.3 kg (185 lb 13.6 oz) 82.7 kg (182 lb 5.1 oz) 79.6 kg (175 lb 7.8 oz)    Exam:   General:  Lethargic, deconditioned, ill appearing  Cardiovascular:  S1, S2 present, no added hrt sound  Respiratory: Chest tube on the left, decreased BS at the bases   Abdomen: Mildly distended, soft, nontender, bowel sounds  present  Musculoskeletal: No pedal edema bilaterally  Skin: Normal  Psychiatry: Lethargic, unable to assess   Data Reviewed: CBC: Recent Labs  Lab 06/03/17 1900 06/04/17 0305 06/05/17 0920 06/06/17 0337 06/07/17 0307  WBC 15.3* 14.0* 20.3* 22.5* 25.7*  NEUTROABS 12.5*  --   --   --  21.1*  HGB 9.1* 8.5* 10.3* 10.7* 11.0*  HCT 29.3* 27.4* 33.8* 33.7* 35.6*  MCV 95.8 96.1 94.2 93.1 92.2  PLT 215 195 200 201 782   Basic Metabolic Panel: Recent Labs  Lab 06/14/2017 0710 06/03/17 0246 06/04/17 0305 06/06/17 0337  NA 142 141 142 140  K 4.5 5.0 5.0 4.4  CL 108 111 114* 109  CO2 20* 22 20* 20*  GLUCOSE 146* 98 82 129*  BUN 38* 38* 40* 23*  CREATININE 1.64* 1.65* 1.57* 1.13*  CALCIUM 8.9 8.2* 8.0* 8.6*   GFR: Estimated Creatinine Clearance: 32.4 mL/min (A) (by C-G formula based on SCr of 1.13 mg/dL (H)). Liver Function Tests: No results for input(s): AST, ALT, ALKPHOS, BILITOT, PROT, ALBUMIN in the last 168 hours. No results for input(s): LIPASE, AMYLASE in the last 168 hours. No results for input(s): AMMONIA in the last 168 hours. Coagulation Profile: Recent Labs  Lab 06/03/17 0246  INR 1.19   Cardiac Enzymes: Recent Labs  Lab 06/10/2017 1053 06/28/2017 1741 06/14/2017 2251  TROPONINI 1.56* 1.38* 1.57*   BNP (last 3 results) No results for input(s): PROBNP in the last 8760 hours. HbA1C: No results for input(s): HGBA1C in the last 72 hours. CBG: Recent Labs  Lab 06/03/17 1137  GLUCAP 107*   Lipid Profile: No results for input(s): CHOL, HDL, LDLCALC, TRIG, CHOLHDL, LDLDIRECT in the last 72 hours. Thyroid Function Tests: No results for input(s): TSH, T4TOTAL, FREET4, T3FREE, THYROIDAB in the last 72 hours. Anemia Panel: No results for input(s): VITAMINB12, FOLATE, FERRITIN, TIBC, IRON, RETICCTPCT in the last 72 hours. Urine analysis:    Component Value Date/Time   COLORURINE YELLOW 11/26/2007 1115   APPEARANCEUR CLEAR 11/26/2007 1115   LABSPEC 1.011  11/26/2007 1115   PHURINE 7.0 11/26/2007 1115   GLUCOSEU NEGATIVE 11/26/2007 1115   HGBUR NEGATIVE 11/26/2007 1115   BILIRUBINUR NEGATIVE 11/26/2007 1115   KETONESUR NEGATIVE 11/26/2007 1115   PROTEINUR NEGATIVE 11/26/2007 1115   UROBILINOGEN 0.2 11/26/2007 1115   NITRITE NEGATIVE 11/26/2007 1115   LEUKOCYTESUR LARGE (A) 11/26/2007 1115   Sepsis Labs: @LABRCNTIP (procalcitonin:4,lacticidven:4)  ) Recent Results (from the past 240 hour(s))  Blood Culture (routine x 2)     Status: None   Collection Time: 06/24/2017  7:40 AM  Result Value Ref Range Status   Specimen Description BLOOD RIGHT WRIST  Final   Special Requests IN PEDIATRIC BOTTLE Blood Culture adequate volume  Final   Culture   Final    NO GROWTH 5 DAYS Performed at [facility], [place] [address] [place], [place] [address]    Report Status 06/07/2017 FINAL  Final  Blood Culture (routine x 2)     Status: None   Collection Time: 06/28/2017  7:51 AM  Result Value Ref Range Status   Specimen Description BLOOD RIGHT HAND  Final   Special Requests   Final    BOTTLES DRAWN AEROBIC ONLY Blood Culture adequate volume   Culture   Final    NO GROWTH 5 DAYS  Performed at [facility], [place] [address] [place], [place] [address]    Report Status 06/07/2017 FINAL  Final  Stat Gram stain     Status: None   Collection Time: 06/03/17  5:28 PM  Result Value Ref Range Status   Specimen Description PLEURAL  Final   Special Requests NONE  Final   Gram Stain   Final    NO WBC SEEN NO ORGANISMS SEEN Performed at [facility], [place] [address], [place], [place] [address]    Report Status 06/03/2017 FINAL  Final  Culture, body fluid-bottle     Status: None (Preliminary result)   Collection Time: 06/03/17  5:28 PM  Result Value Ref Range Status   Specimen Description PLEURAL  Final   Special Requests BOTTLES DRAWN AEROBIC AND ANAEROBIC  Final   Culture   Final    NO GROWTH 4 DAYS Performed at [facility], [place] [address], [place], [place] [address]    Report Status PENDING  Incomplete      Studies: No results found.  Scheduled Meds: . atorvastatin  10 mg Oral q1800  . chlorhexidine  15 mL Mouth Rinse BID  . citalopram  40 mg Oral Daily  . feeding supplement  1 Container Oral TID BM  . imipramine  100 mg Oral QHS  . latanoprost  1 drop Both Eyes QHS  . LORazepam  1 mg Oral QHS  . losartan  100 mg Oral Daily  . mouth rinse  15 mL Mouth Rinse q12n4p  . metoprolol tartrate  12.5 mg Oral BID  . traZODone  200 mg Oral QHS    Continuous Infusions: . azithromycin Stopped (06/07/17 1257)  . cefTRIAXone (ROCEPHIN)  IV Stopped (06/07/17 1257)     LOS: 5 days     Alma Friendly, MD [facility]  If 7PM-7AM, please contact night-coverage [email or web address] Password [email or web address] 06/07/2017, 5:05 PM

## 2017-06-07 NOTE — Progress Notes (Signed)
Pt only responding to voice with right side facial droop and sluggish right pupil. On call NP notified. Will continue to monitor. Isac Caddy, RN

## 2017-06-07 NOTE — Progress Notes (Addendum)
Shift event: RN paged because pt is basically unresponsive which is different than what day RN reported. Per MD note today, pt lethargic, but answered questions in ROS. Per day RN, pt was talking and asked for pain med about 3 hours ago. NP reviewed CT which showed several infarcts ? Subacute. NP to bedside.  S: can not participate in ROS secondary to mental status.  O: chronically ill appearing elderly, frail lady in NAD. Non verbal. Pupils pinpoint and sluggish. Does not follow commands or wake up to name calling or sternal rub. Only reacts to pain.  A/P: 1. Unresponsive except to pain-? Opiates given pinpoint pupils. Narcan x 1. Will call neuro to have them read the CT and get MRI as needed. Check ammonia, CBC, CMP. CXR neg on 2/5. UA neg. After one dose of Narcan, she is moving, dry heaving. Sit HOB up. High risk for aspiration. NPO. Stop sedative meds. Repeat Narcan now.  2. Large pleural effusion and pneumothorax. Pneumo resolved with chest tube. ? Malignancy of lung. PCCM following.  3. Leukocytosis with no obvious source of infection. On abx. LA neg.  Will follow labs and pt. Call neuro.   Update: NP spoke to Dr. Myrtie Soman from neuro. He personally read the CT head. Stated. MRI with and without because he is unable to discern whether the infarcts are acute or old. Also, given suspicion of lung cancer, these may represent metastasis.   Total critical care time: 60 minutes Critical care time was exclusive of separately billable procedures and treating other patients. Critical care was necessary to treat or prevent imminent or life-threatening deterioration. Critical care was time spent personally by me on the following activities: development of treatment plan with patient and/or surrogate as well as nursing, discussions with consultants, evaluation of patient's response to treatment, examination of patient, obtaining history from patient or surrogate, ordering and performing treatments and  interventions, ordering and review of laboratory studies, ordering and review of radiographic studies, pulse oximetry and re-evaluation of patient's condition.

## 2017-06-08 ENCOUNTER — Inpatient Hospital Stay (HOSPITAL_COMMUNITY): Payer: Medicare Other

## 2017-06-08 DIAGNOSIS — Z515 Encounter for palliative care: Secondary | ICD-10-CM

## 2017-06-08 DIAGNOSIS — J90 Pleural effusion, not elsewhere classified: Secondary | ICD-10-CM

## 2017-06-08 DIAGNOSIS — J9601 Acute respiratory failure with hypoxia: Secondary | ICD-10-CM

## 2017-06-08 DIAGNOSIS — J189 Pneumonia, unspecified organism: Secondary | ICD-10-CM

## 2017-06-08 DIAGNOSIS — I249 Acute ischemic heart disease, unspecified: Secondary | ICD-10-CM

## 2017-06-08 DIAGNOSIS — Z7189 Other specified counseling: Secondary | ICD-10-CM

## 2017-06-08 DIAGNOSIS — J910 Malignant pleural effusion: Secondary | ICD-10-CM

## 2017-06-08 LAB — CULTURE, BODY FLUID-BOTTLE

## 2017-06-08 LAB — CBC WITH DIFFERENTIAL/PLATELET
BASOS PCT: 0 %
Basophils Absolute: 0 10*3/uL (ref 0.0–0.1)
EOS PCT: 2 %
Eosinophils Absolute: 0.6 10*3/uL (ref 0.0–0.7)
HCT: 34.7 % — ABNORMAL LOW (ref 36.0–46.0)
Hemoglobin: 10.6 g/dL — ABNORMAL LOW (ref 12.0–15.0)
LYMPHS ABS: 2 10*3/uL (ref 0.7–4.0)
Lymphocytes Relative: 7 %
MCH: 28.6 pg (ref 26.0–34.0)
MCHC: 30.5 g/dL (ref 30.0–36.0)
MCV: 93.5 fL (ref 78.0–100.0)
MONO ABS: 1.4 10*3/uL — AB (ref 0.1–1.0)
Monocytes Relative: 5 %
NEUTROS PCT: 86 %
Neutro Abs: 24.1 10*3/uL — ABNORMAL HIGH (ref 1.7–7.7)
PLATELETS: 156 10*3/uL (ref 150–400)
RBC: 3.71 MIL/uL — ABNORMAL LOW (ref 3.87–5.11)
RDW: 15.6 % — ABNORMAL HIGH (ref 11.5–15.5)
WBC: 28.1 10*3/uL — ABNORMAL HIGH (ref 4.0–10.5)

## 2017-06-08 LAB — BASIC METABOLIC PANEL
Anion gap: 11 (ref 5–15)
BUN: 27 mg/dL — AB (ref 6–20)
CO2: 22 mmol/L (ref 22–32)
Calcium: 8.1 mg/dL — ABNORMAL LOW (ref 8.9–10.3)
Chloride: 107 mmol/L (ref 101–111)
Creatinine, Ser: 1.44 mg/dL — ABNORMAL HIGH (ref 0.44–1.00)
GFR calc Af Amer: 36 mL/min — ABNORMAL LOW (ref >60)
GFR, EST NON AFRICAN AMERICAN: 31 mL/min — AB (ref >60)
GLUCOSE: 104 mg/dL — AB (ref 65–99)
POTASSIUM: 4.9 mmol/L (ref 3.5–5.1)
Sodium: 140 mmol/L (ref 135–145)

## 2017-06-08 LAB — CULTURE, BODY FLUID W GRAM STAIN -BOTTLE: Culture: NO GROWTH

## 2017-06-08 LAB — PROCALCITONIN: Procalcitonin: 0.65 ng/mL

## 2017-06-08 MED ORDER — HALOPERIDOL LACTATE 5 MG/ML IJ SOLN: 0.5000 mg | INTRAMUSCULAR | Status: DC | PRN

## 2017-06-08 MED ORDER — LORAZEPAM 2 MG/ML PO CONC: 1.0000 mg | ORAL | Status: DC | PRN

## 2017-06-08 MED ORDER — LORAZEPAM 2 MG/ML PO CONC: 0.5000 mg | Freq: Four times a day (QID) | ORAL | Status: DC | PRN

## 2017-06-08 MED ORDER — LORAZEPAM 2 MG/ML PO CONC: 1.0000 mg | Freq: Every day | ORAL | Status: DC

## 2017-06-08 MED ORDER — GLYCOPYRROLATE 0.2 MG/ML IJ SOLN
0.2000 mg | INTRAMUSCULAR | Status: DC | PRN
  Administered 2017-06-12: 0.2 mg via INTRAVENOUS
  Filled 2017-06-08: qty 1

## 2017-06-08 MED ORDER — GLYCOPYRROLATE 0.2 MG/ML IJ SOLN: 0.2000 mg | INTRAMUSCULAR | Status: DC | PRN

## 2017-06-08 MED ORDER — MORPHINE SULFATE (PF) 2 MG/ML IV SOLN
2.0000 mg | INTRAVENOUS | Status: DC | PRN
  Administered 2017-06-08: 2 mg via INTRAVENOUS
  Filled 2017-06-08: qty 1

## 2017-06-08 MED ORDER — LORAZEPAM 1 MG PO TABS: 1.0000 mg | ORAL_TABLET | ORAL | Status: DC | PRN

## 2017-06-08 MED ORDER — LORAZEPAM 2 MG/ML IJ SOLN: 1.0000 mg | INTRAMUSCULAR | Status: DC | PRN

## 2017-06-08 MED ORDER — HALOPERIDOL 1 MG PO TABS: 0.5000 mg | ORAL_TABLET | ORAL | Status: DC | PRN

## 2017-06-08 MED ORDER — GLYCOPYRROLATE 1 MG PO TABS: 1.0000 mg | ORAL_TABLET | ORAL | Status: DC | PRN

## 2017-06-08 MED ORDER — HALOPERIDOL LACTATE 2 MG/ML PO CONC: 0.5000 mg | ORAL | Status: DC | PRN

## 2017-06-08 MED ORDER — MORPHINE SULFATE (PF) 2 MG/ML IV SOLN
2.0000 mg | INTRAVENOUS | Status: DC | PRN
  Administered 2017-06-08 – 2017-06-10 (×12): 2 mg via INTRAVENOUS
  Filled 2017-06-08 (×12): qty 1

## 2017-06-08 NOTE — Consult Note (Signed)
Consultation Note Date: 06/08/2017   Patient Name: Stephanie Dickerson  DOB: 07-Mar-1924  MRN: [id number]  Age / Sex: 84 y.o., female  PCP: Lucretia Kern, DO Referring Physician: Alma Friendly, MD  Reason for Consultation: Establishing goals of care  HPI/Patient Profile: 84 y.o. female  with past medical history of depression, anxiety, breast cancer, osteoarthritis, HTN, hyperlipidemia, hamstring tear (leaving her basically bedbound for the last month, admitted on 06/03/2017 with increasing SOB requiring bipap. Workup revealed acute coronary syndrome with NSTEMI, pleural effusion, chest xray showed ground glass questionable for pneumonia vs malignancy. Chest tube was placed and fluid sent for pathology that shows malignant cells consistent with lung primary. CT scan of chest and abdomen showed L lung apex pleural mass suspicious for malignancy, L left pleural effusion with mass effect on upper abdomen suspicious for effusion, ground glass opacity in RUL, possible liver mass. Last night she had worsening in mental status where she became unresponsive- responded to narcan- she had taken two norco around 1600.  Remains lethargic today, not eating. Palliative medicine consulted for [place].   Clinical Assessment and Goals of Care:  I have reviewed medical records including EPIC notes, labs and imaging, assessed the patient and then met at the bedside along with patient's son Clair Gulling  to discuss diagnosis prognosis, [place], EOL wishes, disposition and options.  I introduced Palliative Medicine as specialized medical care for people living with serious illness. It focuses on providing relief from the symptoms and stress of a serious illness. The goal is to improve quality of life for both the patient and the family.  We discussed a brief life review of the patient. Clair Gulling describes her as a spitfire, full of life. Always independent.  Very close to her family- especially her grandchildren. She was married to [place] father, who died from lung cancer.   As far as functional and nutritional status- she was very independent, active and cognitively sharp until about a month ago. Since early January she has been basically bedbound, getting up only to go to the bathroom. She has not eaten in several weeks. Clair Gulling has noticed that she has become confused and even hallucinating at times over the last few weeks.   We discussed their current illness and what it means in the larger context of their on-going co-morbidities.  Natural disease trajectory and expectations at EOL were discussed. We discussed her pathology results from the her pleural effusion indicating primary lung malignancy. The difference between aggressive medical intervention and comfort care was considered in light of the patient's goals of care.  im said she would absolutely not want aggressive treatment of a malignancy, however, there are family members who would want to travel to visit and visit before transitioning to full comfort care.   Advanced directives, concepts specific to code status, artifical feeding and hydration, and rehospitalization were considered and discussed. Patient is DNR, no artificial feeding or rehospitalization would be desired. No artificial hydration once patient is transitioned to comfort measures only. Allow for comfort feeding  and drinking.  Hospice and Palliative Care services outpatient were explained and offered. Clair Gulling would like to request a bed at residential hospice. I believe patient would be appropriate given her need for symptom management, and likelihood for quick decline once transitioned to comfort measures only with sepsis in the setting of malignant pleural effusions, mental status changes, not eating or drinking.   Questions and concerns were addressed.  The family was encouraged to call with questions or concerns.   Primary Decision  Maker HCPOA    SUMMARY OF RECOMMENDATIONS -Continue current level of care with some added comfort measures measures -Start low dose morphine for signs of pain, discomfort, or SOB -Restart lorazepam as patient was on this at home- Clair Gulling is worried that patient may be anxious not being on this now- will also add prn dose if she becomes anxious with increasing SOB -Request bed at [facility] with plan to transition care to full comfort once bed is available- if bed is not available by Saturday- will transition to full comfort care here in the hospital and proceed from there    Code Status/Advance Care Planning:  DNR  Palliative Prophylaxis:   Delirium Protocol and Frequent Pain Assessment  Additional Recommendations (Limitations, Scope, Preferences):  Avoid Hospitalization, Full Comfort Care, No Artificial Feeding and No Chemotherapy  Prognosis:    < 2 weeks due to sepsis pneumonia in setting of newly diagnosis lung cancer with malignant pleural effusion, NSTEMI, recent steep decline in functional status, not taking po, mental status changes  Discharge Planning: Hospice facility  Primary Diagnoses: Present on Admission: . Hypertension . Hyperlipidemia   I have reviewed the medical record, interviewed the patient and family, and examined the patient. The following aspects are pertinent.  Past Medical History:  Diagnosis Date  . Anxiety/Insomnia - followed by Debbe Bales, psych nurse 02/02/2012  . Cancer ([place])    Breast cancer-Right  . Chicken pox   . Depression   . Glaucoma   . Hyperlipidemia   . Hypertension   . Osteoarthritis - followed by Dr. Lawana Chambers in Ortho 02/02/2012  . Osteopenia    Social History   Socioeconomic History  . Marital status: Widowed    Spouse name: None  . Number of children: None  . Years of education: None  . Highest education level: None  Social Needs  . Financial resource strain: None  . Food insecurity - worry: None  . Food insecurity -  inability: None  . Transportation needs - medical: None  . Transportation needs - non-medical: None  Occupational History  . None  Tobacco Use  . Smoking status: Former Smoker    Types: Cigarettes  . Smokeless tobacco: Never Used  . Tobacco comment: "quit smoking cigarettes around 40 years ago"  Substance and Sexual Activity  . Alcohol use: Yes    Comment: per pt a glass of white wine before dinner   . Drug use: No  . Sexual activity: Yes    Birth control/protection: Post-menopausal  Other Topics Concern  . None  Social History Narrative  . None   Family History  Problem Relation Age of Onset  . Bone cancer Mother   . Leukemia Father   . Lung cancer Unknown        husband   Scheduled Meds: . atorvastatin  10 mg Oral q1800  . chlorhexidine  15 mL Mouth Rinse BID  . citalopram  20 mg Oral Daily  . feeding supplement  1 Container Oral TID BM  .  imipramine  100 mg Oral QHS  . latanoprost  1 drop Both Eyes QHS  . losartan  100 mg Oral Daily  . mouth rinse  15 mL Mouth Rinse q12n4p  . metoprolol tartrate  12.5 mg Oral BID   Continuous Infusions: PRN Meds:.acetaminophen, albuterol, hydrALAZINE, nitroGLYCERIN, ondansetron (ZOFRAN) IV, polyethylene glycol Medications Prior to Admission:  Prior to Admission medications   Medication Sig Start Date End Date Taking? Authorizing Provider  acetaminophen (TYLENOL) 500 MG tablet Take 500-1,000 mg by mouth every 6 (six) hours as needed for mild pain.   Yes [provider]  amLODipine (NORVASC) 10 MG tablet TAKE ONE TABLET BY MOUTH DAILY   -- NEEDS APPOINTMENT FOR PHYSICAL EXAM FOR REFILLS 01/03/17  Yes Lucretia Kern, DO  aspirin 81 MG tablet Take 81 mg by mouth daily.   Yes [provider]  atorvastatin (LIPITOR) 80 MG tablet TAKE 1/2 TABLETS (40 MG TOTAL) BY MOUTH DAILY. 03/30/17  Yes Colin Benton R, DO  celecoxib (CELEBREX) 200 MG capsule Take 200 mg by mouth daily.   Yes [provider]  citalopram (CELEXA)  40 MG tablet Take 40 mg by mouth daily.   Yes [provider]  GLUCOSAMINE-CHONDROITIN PO Take 1 tablet by mouth every morning.    Yes [provider]  imipramine (TOFRANIL) 50 MG tablet Take 100 mg by mouth at bedtime.    Yes [provider]  LORazepam (ATIVAN) 1 MG tablet Take 1 mg by mouth at bedtime.    Yes [provider]  losartan (COZAAR) 100 MG tablet TAKE 1 TABLET (100 MG TOTAL) BY MOUTH DAILY. 03/30/17  Yes Lucretia Kern, DO  methocarbamol (ROBAXIN) 500 MG tablet Take 500 mg by mouth daily as needed for muscle spasms.    Yes [provider]  naproxen sodium (ALEVE) 220 MG tablet Take 220 mg by mouth daily as needed.   Yes [provider]  Polyethylene Glycol 3350 (MIRALAX PO) Take 1 Applicatorful by mouth daily as needed (constipation).    Yes [provider]  Travoprost, BAK Free, (TRAVATAN Z) 0.004 % SOLN ophthalmic solution Place 1 drop into both eyes at bedtime.   Yes [provider]  traZODone (DESYREL) 100 MG tablet Take 200 mg by mouth at bedtime.    Yes [provider]   Allergies  Allergen Reactions  . Lactose Intolerance (Gi) Diarrhea   Review of Systems  Unable to perform ROS: Mental status change    Physical Exam  Constitutional: She appears well-developed.  Pulmonary/Chest: Effort normal.  Neurological:  Did not attempt to awake and patient son stated she had just gotten "relief"  Skin: Skin is warm and dry.  Nursing note and vitals reviewed.   Vital Signs: BP (!) 109/58 (BP Location: Left Arm)   Pulse 92   Temp 98.5 F (36.9 C) (Axillary)   Resp 18   Ht 5' 3"  (1.6 m) Comment: patient reported  Wt 76.7 kg (169 lb 1.5 oz)   SpO2 95%   BMI 29.95 kg/m  Pain Assessment: No/denies pain   Pain Score: Asleep   SpO2: SpO2: 95 % O2 Device:SpO2: 95 % O2 Flow Rate: .O2 Flow Rate (L/min): 2 L/min  IO: Intake/output summary:   Intake/Output Summary (Last 24 hours) at 06/08/2017  1409 Last data filed at 06/08/2017 1228 Gross per 24 hour  Intake 0 ml  Output 550 ml  Net -550 ml    LBM: Last BM Date: 06/05/17 Baseline Weight: Weight: 79.4 kg (  175 lb)(patient reported) Most recent weight: Weight: 76.7 kg (169 lb 1.5 oz)     Palliative Assessment/Data: PPS: 10%     Thank you for this consult. Palliative medicine will continue to follow and assist as needed.   Time In: 1300 Time Out: 1430 Time Total: 90 minutes Greater than 50%  of this time was spent counseling and coordinating care related to the above assessment and plan.  Signed by: Mariana Kaufman, AGNP-C Palliative Medicine    Please contact Palliative Medicine Team phone at [phone] for questions and concerns.  For individual provider: See Shea Evans

## 2017-06-08 NOTE — Progress Notes (Signed)
[facility]  Received request from [place] for family interest in [facility] Chart reviewed and appreciate report from PMT NP Mariana Kaufman. Spoke with patient's son Clair Gulling by phone to acknowledge referral. Family are familiar with [facility]. Hospice Liaison will follow up with Leonard Downing or CSW tomorrow.   Thank you.  Erling Conte, [place]

## 2017-06-08 NOTE — Progress Notes (Signed)
PROGRESS NOTE  Stephanie Dickerson KVQ:[id number] DOB: 08-08-1923 DOA: 06/04/2017 PCP: Lucretia Kern, DO  HPI/Recap of past 85 hours: 84 year old female who presented with worsening dyspnea. Patient with a significant past medical history for hypertension, dyslipidemia, history of breast cancer and depression. Patient was found with significant respiratory distress while lying in her bed, she was confused and lethargic. Apparently she had developed worsening dyspnea for last 3 to 4 days that initially was on exertion, and preceded by an upper respiratory tract infection, that was diagnosed about 2 weeks ago. Her functional physical capacity has been significantly decreased after a hamstring tear. On initial physical examination she was in acute respiratory distress, her oxygen saturation was 75% and she was placed on noninvasive mechanical ventilation. Patient was admitted to the stepdown unit with working diagnosis of acute hypoxic respiratory failure due to large left pleural effusion, likely para-pneumonic effusion complicating a community acquired pneumonia.  Today, pt noted to be still more lethargic, unresponsive, ill-appearing. CT head showed possible infarcts. MRI showed multiple acute infarcts. Palliative consulted, son agreed to transition to comfort care. Pt is currently hospice, awaiting placement at [facility]  Assessment/Plan: Principal Problem:   Acute respiratory failure (HCC) Active Problems:   Hyperlipidemia   Hypertension   NSTEMI (non-ST elevated myocardial infarction) (HCC)   Pleural effusion   CAP (community acquired pneumonia)   Counseling regarding end of life decision making   Acute renal disease   Liver mass   Stupor   ACS (acute coronary syndrome) (HCC)   Malignant pleural effusion   Advance care planning   Goals of care, counseling/discussion   Palliative care by specialist  Acute encephalopathy due to multiple acute infarcts Non-responsive, response to pain MRI  showed above Palliative on board: transitioned to comfort care  Acute hypoxic respiratoryfailuredue to left para-pneumonic pleural effusion, complicated with post pleural catheter pneumothorax Still on O2 [place] Afebrile with worsening leukocytosis CXR with resolved pneumothorax BC x 2 NGTD Pleural fluid culture and gram stain: NGTD, no wbc seen, Cytology result showed malignancy Discontinue IV antibiotic Chest tube still draining serosanguinous fluid Comfort care  NSTEMI Chest pain free LV ejection fraction 45-50%, with inferior and inferior septal hypokinesis to akinesis  HTN Stable  Dyslipidemia  Depression Comfort care     Code Status: DNR  Family Communication: Discussed with son and RN  Disposition Plan: [facility] for comfort care   Consultants:  PCCM  Oncology  Palliative  Procedures:  Left chest tube placement on 06/03/17   Antimicrobials:  IV ceftriaxone  IV Azithromycin   DVT prophylaxis: SCDs   Objective: Vitals:   06/07/17 0351 06/07/17 2008 06/08/17 0436 06/08/17 0517  BP: (!) 109/59 (!) 109/44  (!) 109/58  Pulse: 92     Resp: (!) 27 18    Temp: 98.4 F (36.9 C) 98.2 F (36.8 C)  98.5 F (36.9 C)  TempSrc: Axillary Oral  Axillary  SpO2: 100% 100%  95%  Weight: 79.6 kg (175 lb 7.8 oz)  76.7 kg (169 lb 1.5 oz)   Height:        Intake/Output Summary (Last 24 hours) at 06/08/2017 2244 Last data filed at 06/08/2017 2123 Gross per 24 hour  Intake 0 ml  Output 950 ml  Net -950 ml   Filed Weights   06/06/17 0307 06/07/17 0351 06/08/17 0436  Weight: 82.7 kg (182 lb 5.1 oz) 79.6 kg (175 lb 7.8 oz) 76.7 kg (169 lb 1.5 oz)    Exam:   General:  Non-responsive  Cardiovascular: S1, S2 present, no added hrt sound  Respiratory: Chest tube on the left, decreased BS at the bases   Abdomen: Mildly distended, soft, nontender, bowel sounds present  Musculoskeletal: No pedal edema bilaterally  Skin: Normal  Psychiatry: Unable to  assess   Data Reviewed: CBC: Recent Labs  Lab 06/03/17 1900  06/05/17 0920 06/06/17 0337 06/07/17 0307 06/07/17 2248 06/08/17 0315  WBC 15.3*   < > 20.3* 22.5* 25.7* 27.0* 28.1*  NEUTROABS 12.5*  --   --   --  21.1*  --  24.1*  HGB 9.1*   < > 10.3* 10.7* 11.0* 11.4* 10.6*  HCT 29.3*   < > 33.8* 33.7* 35.6* 36.9 34.7*  MCV 95.8   < > 94.2 93.1 92.2 93.4 93.5  PLT 215   < > 200 201 172 175 156   < > = values in this interval not displayed.   Basic Metabolic Panel: Recent Labs  Lab 06/03/17 0246 06/04/17 0305 06/06/17 0337 06/07/17 2248 06/08/17 0315  NA 141 142 140 139 140  K 5.0 5.0 4.4 5.1 4.9  CL 111 114* 109 106 107  CO2 22 20* 20* 20* 22  GLUCOSE 98 82 129* 107* 104*  BUN 38* 40* 23* 28* 27*  CREATININE 1.65* 1.57* 1.13* 1.37* 1.44*  CALCIUM 8.2* 8.0* 8.6* 8.3* 8.1*   GFR: Estimated Creatinine Clearance: 24.9 mL/min (A) (by C-G formula based on SCr of 1.44 mg/dL (H)). Liver Function Tests: Recent Labs  Lab 06/07/17 2248  AST 23  ALT 14  ALKPHOS 73  BILITOT 0.4  PROT 4.6*  ALBUMIN 2.1*   No results for input(s): LIPASE, AMYLASE in the last 168 hours. Recent Labs  Lab 06/07/17 2248  AMMONIA 22   Coagulation Profile: Recent Labs  Lab 06/03/17 0246  INR 1.19   Cardiac Enzymes: Recent Labs  Lab 06/05/2017 1053 06/14/2017 1741 06/16/2017 2251  TROPONINI 1.56* 1.38* 1.57*   BNP (last 3 results) No results for input(s): PROBNP in the last 8760 hours. HbA1C: No results for input(s): HGBA1C in the last 72 hours. CBG: Recent Labs  Lab 06/03/17 1137  GLUCAP 107*   Lipid Profile: No results for input(s): CHOL, HDL, LDLCALC, TRIG, CHOLHDL, LDLDIRECT in the last 72 hours. Thyroid Function Tests: No results for input(s): TSH, T4TOTAL, FREET4, T3FREE, THYROIDAB in the last 72 hours. Anemia Panel: No results for input(s): VITAMINB12, FOLATE, FERRITIN, TIBC, IRON, RETICCTPCT in the last 72 hours. Urine analysis:    Component Value Date/Time    COLORURINE YELLOW 11/26/2007 1115   APPEARANCEUR CLEAR 11/26/2007 1115   LABSPEC 1.011 11/26/2007 1115   PHURINE 7.0 11/26/2007 1115   GLUCOSEU NEGATIVE 11/26/2007 1115   HGBUR NEGATIVE 11/26/2007 1115   BILIRUBINUR NEGATIVE 11/26/2007 1115   KETONESUR NEGATIVE 11/26/2007 1115   PROTEINUR NEGATIVE 11/26/2007 1115   UROBILINOGEN 0.2 11/26/2007 1115   NITRITE NEGATIVE 11/26/2007 1115   LEUKOCYTESUR LARGE (A) 11/26/2007 1115   Sepsis Labs: @LABRCNTIP (procalcitonin:4,lacticidven:4)  ) Recent Results (from the past 240 hour(s))  Blood Culture (routine x 2)     Status: None   Collection Time: 06/11/2017  7:40 AM  Result Value Ref Range Status   Specimen Description BLOOD RIGHT WRIST  Final   Special Requests IN PEDIATRIC BOTTLE Blood Culture adequate volume  Final   Culture   Final    NO GROWTH 5 DAYS Performed at [facility], [place] [address], [place], [place] [address]    Report Status 06/07/2017 FINAL  Final  Blood Culture (routine x 2)     Status: None   Collection Time: 06/14/2017  7:51 AM  Result Value Ref Range Status   Specimen Description BLOOD RIGHT HAND  Final   Special Requests   Final    BOTTLES DRAWN AEROBIC ONLY Blood Culture adequate volume   Culture   Final    NO GROWTH 5 DAYS Performed at [facility], [place] [address] [place], [place] [address]    Report Status 06/07/2017 FINAL  Final  Stat Gram stain     Status: None   Collection Time: 06/03/17  5:28 PM  Result Value Ref Range Status   Specimen Description PLEURAL  Final   Special Requests NONE  Final   Gram Stain   Final    NO WBC SEEN NO ORGANISMS SEEN Performed at [facility], [place] [address], [place], [place] [address]    Report Status 06/03/2017 FINAL  Final  Culture, body fluid-bottle     Status: None   Collection Time: 06/03/17  5:28 PM  Result Value Ref Range Status   Specimen Description PLEURAL  Final   Special Requests BOTTLES DRAWN AEROBIC AND ANAEROBIC  Final   Culture    Final    NO GROWTH 5 DAYS Performed at [facility], [place] [address], [place], [place] [address]    Report Status 06/08/2017 FINAL  Final      Studies: Mr Brain Wo Contrast  Result Date: 06/08/2017 CLINICAL DATA:  Ataxia, stroke suspected.  Sluggish right pupil. EXAM: MRI HEAD WITHOUT CONTRAST TECHNIQUE: Multiplanar, multiecho pulse sequences of the brain and surrounding structures were obtained without intravenous contrast. COMPARISON:  Head CT from yesterday FINDINGS: Brain: Extensive acute infarction. Along the bilateral cerebral watershed distributions is extensive cortical and white matter infarct, largest and most confluent in the left parietal and bilateral occipital regions. Small acute infarcts in the bilateral thalamus and left caudate head. Numerous small to moderate acute infarcts in the bilateral cerebellum. There is a background of chronic small vessel ischemia in the cerebral white matter. Generalized cerebral volume loss that is mild to moderate. Hypointensity in the left frontal lobe on gradient imaging is most likely chronic based on axial T2 weighted imaging. No acute hemorrhage on head CT yesterday. Progressively motion degraded exam, especially affecting coronal T2 and T1 weighted imaging. Vascular: Grossly preserved major flow voids. Skull and upper cervical spine: No noted marrow lesion. Sinuses/Orbits: Bilateral cataract resection.  No acute finding. Other: These results were called by telephone at the time of interpretation on 06/08/2017 at 1:36 pm to Dr. Lesia Sago , who verbally acknowledged these results. IMPRESSION: 1. Extensive acute infarction in the bilateral cerebellum, bilateral cerebral cortex, and bilateral thalamus. This pattern suggests central embolic disease. 2. Motion degraded. Electronically Signed   By: Monte Fantasia M.D.   On: 06/08/2017 13:37    Scheduled Meds: . latanoprost  1 drop Both Eyes QHS    Continuous Infusions:    LOS: 6 days      Alma Friendly, MD [facility]  If 7PM-7AM, please contact night-coverage [email or web address] Password [facility] 06/08/2017, 10:44 PM

## 2017-06-08 NOTE — Progress Notes (Signed)
Name: MARGOT ORIORDAN MRN: [id number] DOB: 1923/10/27    ADMISSION DATE:  06/15/2017 CONSULTATION DATE: June 03, 2017  REFERRING MD : Hospitalist service  CHIEF COMPLAINT: Shortness of breath tachypnea pleural effusion  BRIEF PATIENT DESCRIPTION:  84-yowf remote smoker from [place]  who is here because of acute respiratory failure shortness of breath she lives with her son she has been active until 6 months ago  And around 2 weeks PA developed uri symptoms with proressive sob so came into the ER where placed on on BiPAP she with dx non-STEMI  And placed on heparin and CCM service consulted on a large L effusion.    SUBJECTIVE:  Events overnight noted, pathology as below.  Son at bedside.     Intake/Output Summary (Last 24 hours) at 06/08/2017 1503 Last data filed at 06/08/2017 1228 Gross per 24 hour  Intake 0 ml  Output 550 ml  Net -550 ml     VITAL SIGNS: Temp:  [98.2 F (36.8 C)-98.5 F (36.9 C)] 98.5 F (36.9 C) (02/07 0517) Resp:  [18] 18 (02/06 2008) BP: (109)/(44-58) 109/58 (02/07 0517) SpO2:  [95 %-100 %] 95 % (02/07 0517) Weight:  [169 lb 1.5 oz (76.7 kg)] 169 lb 1.5 oz (76.7 kg) (02/07 0436)    PHYSICAL EXAMINATION: General: frail elderly female lying in bed, NAD  HEENT: MM pink/dry Neuro: lethargic  CV: s1s2 rrr, no m/r/g PULM: non-labored  Extremities: warm/dry, no edema  Skin: no rashes or lesions    Recent Labs  Lab 06/06/17 0337 06/07/17 2248 06/08/17 0315  NA 140 139 140  K 4.4 5.1 4.9  CL 109 106 107  CO2 20* 20* 22  BUN 23* 28* 27*  CREATININE 1.13* 1.37* 1.44*  GLUCOSE 129* 107* 104*   Recent Labs  Lab 06/07/17 0307 06/07/17 2248 06/08/17 0315  HGB 11.0* 11.4* 10.6*  HCT 35.6* 36.9 34.7*  WBC 25.7* 27.0* 28.1*  PLT 172 175 156   Ct Head Wo Contrast  Result Date: 06/07/2017 CLINICAL DATA:  Altered level of consciousness, former smoker, acute respiratory failure, shortness of breath, non STEMI, large LEFT pleural effusion,  history RIGHT breast cancer, hypertension EXAM: CT HEAD WITHOUT CONTRAST TECHNIQUE: Contiguous axial images were obtained from the base of the skull through the vertex without intravenous contrast. Sagittal and coronal MPR images reconstructed from axial data set. COMPARISON:  None FINDINGS: Brain: Generalized atrophy. Normal ventricular morphology. No midline shift or mass effect. LEFT occipital infarct, probably subacute. Questionable small infarct in RIGHT occipital lobe. Patchy areas of low-attenuation in the cerebellar hemispheres which likely represent additional areas of infarction, question subacute. Extensive small vessel chronic ischemic changes of deep cerebral white matter. No intracranial hemorrhage or mass lesion. No extra-axial fluid collections. Vascular: Atherosclerotic calcifications of internal carotid and vertebral arteries at skull base Skull: Intact Sinuses/Orbits: Clear Other: N/A IMPRESSION: Atrophy with small vessel chronic ischemic changes of deep cerebral white matter. Infarcts identified in the LEFT occipital lobe, questionably RIGHT occipital lobe, and BILATERAL cerebellar hemispheres LEFT greater than RIGHT, question subacute. No intracranial hemorrhage identified. Electronically Signed   By: Lavonia Dana M.D.   On: 06/07/2017 17:04   Mr Brain Wo Contrast  Result Date: 06/08/2017 CLINICAL DATA:  Ataxia, stroke suspected.  Sluggish right pupil. EXAM: MRI HEAD WITHOUT CONTRAST TECHNIQUE: Multiplanar, multiecho pulse sequences of the brain and surrounding structures were obtained without intravenous contrast. COMPARISON:  Head CT from yesterday FINDINGS: Brain: Extensive acute infarction. Along the bilateral cerebral watershed distributions is  extensive cortical and white matter infarct, largest and most confluent in the left parietal and bilateral occipital regions. Small acute infarcts in the bilateral thalamus and left caudate head. Numerous small to moderate acute infarcts in the  bilateral cerebellum. There is a background of chronic small vessel ischemia in the cerebral white matter. Generalized cerebral volume loss that is mild to moderate. Hypointensity in the left frontal lobe on gradient imaging is most likely chronic based on axial T2 weighted imaging. No acute hemorrhage on head CT yesterday. Progressively motion degraded exam, especially affecting coronal T2 and T1 weighted imaging. Vascular: Grossly preserved major flow voids. Skull and upper cervical spine: No noted marrow lesion. Sinuses/Orbits: Bilateral cataract resection.  No acute finding. Other: These results were called by telephone at the time of interpretation on 06/08/2017 at 1:36 pm to Dr. Lesia Sago , who verbally acknowledged these results. IMPRESSION: 1. Extensive acute infarction in the bilateral cerebellum, bilateral cerebral cortex, and bilateral thalamus. This pattern suggests central embolic disease. 2. Motion degraded. Electronically Signed   By: Monte Fantasia M.D.   On: 06/08/2017 13:37   SIGNIFICANT EVENTS  2/02  Left Chest tube >>   STUDIES: L Pleural effusion 2/2 >> xerosanguinous, Exudate by LDH 328 / WBC 205  With P > L.  L Pleural Cytology 2/2 >>   CULTURES  BC x 2/1 >> Influenza 2/1 >> negative L Pleural Fluid 2/2 >>  Quantiferon Gold 2/4 >> indeterminate  ASSESSMENT / PLAN:  Discussion: 84 y/o F, remote smoker, admitted 2/1 with SOB and decline in activity level.  Two weeks PTA, she developed URI symptoms.  Work up concerning for NSTEMI and large left pleural effusion.  Left pigtail catheter placed 2/2 with exudative properties, cytology pending.  Called pathology 2/6 am and special stains are pending.    Malignant Left Pleural Effusion - large effusion s/p chest tube placement 2/2, slightly bloody on drainage, positive for malignant cells with lung as primary P: Would leave chest tube in place for palliative measures  No further chest xrays Would use morphine / ativan PRN  for dyspnea  Acute Hypoxic Respiratory Failure  P: O2 for comfort  Recommend no further monitoring of saturations   RUL Ground Glass - likely malignancy.   P: Would not recommend further work up   Acute CVA P: Recommend palliative care   Reviewed findings with son Clair Gulling) at bedside.  He states he is ready to transition to full comfort care.  Would stop all labs, antibiotics, lab draws, etc.  Consider using ativan / morphine for comfort / pain.  If bed available on palliative care floor, consider transfer vs transfer to [facility].    PCCM will sign off.  Please call back if new needs arise.   Noe Gens, NP-C [facility] Pgr: [phone] or if no answer [phone] 06/08/2017, 3:03 PM   Attending:  I have seen and examined the patient with nurse practitioner/resident and agree with the note above.  We formulated the plan together and I elicited the following history.    Subjective: Malignant pleural effusion.  Objective: Vitals:   06/07/17 0351 06/07/17 2008 06/08/17 0436 06/08/17 0517  BP: (!) 109/59 (!) 109/44  (!) 109/58  Pulse: 92     Resp: (!) 27 18    Temp: 98.4 F (36.9 C) 98.2 F (36.8 C)  98.5 F (36.9 C)  TempSrc: Axillary Oral  Axillary  SpO2: 100% 100%  95%  Weight: 79.6 kg (175 lb  7.8 oz)  76.7 kg (169 lb 1.5 oz)   Height:          Intake/Output Summary (Last 24 hours) at 06/08/2017 1713 Last data filed at 06/08/2017 1228 Gross per 24 hour  Intake 0 ml  Output 550 ml  Net -550 ml   General: frail elderly female lying in bed, NAD  HEENT: MM pink/dry Neuro: lethargic  CV: s1s2 rrr, no m/r/g PULM: non-labored  Extremities: warm/dry, no edema  Skin: no rashes or lesions  No air leak in the pleur evac system.   CBC    Component Value Date/Time   WBC 28.1 (H) 06/08/2017 0315   RBC 3.71 (L) 06/08/2017 0315   HGB 10.6 (L) 06/08/2017 0315   HGB 12.3 12/05/2014 1229   HCT 34.7 (L) 06/08/2017 0315   HCT 38.0 12/05/2014 1229    PLT 156 06/08/2017 0315   PLT 212 12/05/2014 1229   MCV 93.5 06/08/2017 0315   MCV 92.4 12/05/2014 1229   MCH 28.6 06/08/2017 0315   MCHC 30.5 06/08/2017 0315   RDW 15.6 (H) 06/08/2017 0315   RDW 14.3 12/05/2014 1229   LYMPHSABS 2.0 06/08/2017 0315   LYMPHSABS 1.4 12/05/2014 1229   MONOABS 1.4 (H) 06/08/2017 0315   MONOABS 0.6 12/05/2014 1229   EOSABS 0.6 06/08/2017 0315   EOSABS 0.2 12/05/2014 1229   BASOSABS 0.0 06/08/2017 0315   BASOSABS 0.1 12/05/2014 1229    BMET    Component Value Date/Time   NA 140 06/08/2017 0315   NA 144 12/05/2014 1230   K 4.9 06/08/2017 0315   K 4.8 12/05/2014 1230   CL 107 06/08/2017 0315   CO2 22 06/08/2017 0315   CO2 30 (H) 12/05/2014 1230   GLUCOSE 104 (H) 06/08/2017 0315   GLUCOSE 99 12/05/2014 1230   BUN 27 (H) 06/08/2017 0315   BUN 28.6 (H) 12/05/2014 1230   CREATININE 1.44 (H) 06/08/2017 0315   CREATININE 1.2 (H) 12/05/2014 1230   CALCIUM 8.1 (L) 06/08/2017 0315   CALCIUM 9.7 12/05/2014 1230   GFRNONAA 31 (L) 06/08/2017 0315   GFRAA 36 (L) 06/08/2017 0315    CXR images  MR BRAIN WO CONTRAST  Final Result    CT HEAD WO CONTRAST  Final Result    DG CHEST PORT 1 VIEW  Final Result    US Abdomen Limited RUQ  Final Result    DG CHEST PORT 1 VIEW  Final Result    DG Chest Port 1 View  Final Result    CT CHEST WO CONTRAST  Final Result    DG CHEST PORT 1 VIEW  Final Result    DG Chest Portable 1 View  Final Result      ASSESSMENT / PLAN:  Discussion: 84 y/o F, remote smoker, admitted 2/1 with SOB and decline in activity level.  Two weeks PTA, she developed URI symptoms.  Work up concerning for NSTEMI and large left pleural effusion.  Left pigtail catheter placed 2/2 with exudative properties, cytology pending.  Called pathology 2/6 am and special stains are pending.    Malignant Left Pleural Effusion - large effusion s/p chest tube placement 2/2, slightly bloody on drainage, positive for malignant cells with lung  as primary P: Would leave chest tube in place for palliative measures. Son concurs. PleurX catheter not required for palliative care. Expect survival time to be very limited.  No further chest xrays Would use morphine / ativan PRN for dyspnea  Acute Hypoxic Respiratory Failure  P:  O2 for comfort  Recommend no further monitoring of saturations   RUL Ground Glass - likely malignancy.   P: Would not recommend further work up   Acute CVA P: Recommend palliative care   Reviewed findings with son Clair Gulling) at bedside.  He states he is ready to transition to full comfort care.  Would stop all labs, antibiotics, lab draws, etc.  Consider using ativan / morphine for comfort / pain.  If bed available on palliative care floor, consider transfer vs transfer to [facility].     Jesus Genera, MD [facility] Pager: [phone]

## 2017-06-08 NOTE — Progress Notes (Signed)
Contacted MRI to check on status as to when pt would be going down and was told there was wait at the moment and would be contacted if any changes. Will continue to monitor. Isac Caddy, RN

## 2017-06-08 NOTE — Progress Notes (Signed)
Addendum to consult:   MRI results reviewed- Called back to room by patient's son.  He would prefer to transition to full comfort measures and transfer to [facility] now rather than waiting. Noted he had discussed with Noe Gens, NP.   Will assist in adding Palliative/End of life orders. I am concerned patient may not live more than hours to days with transition to comfort care. May consider cancelling request for [facility] if patient declines overnight. Will reevaluate in the morning.   Mariana Kaufman, AGNP-C Palliative Medicine  Please call Palliative Medicine team phone with any questions [phone]. For individual providers please see AMION.  Time in: 1530 Time out: 1600

## 2017-06-08 NOTE — Progress Notes (Signed)
CSW received referral for residential Hospice. Patient's son prefers [facility]. CSW sent referral for assessment. [facility] is currently full. CSW to continue to follow.  Percell Locus Merilee Wible LCSW [phone]

## 2017-06-08 NOTE — Progress Notes (Signed)
PT Cancellation Note  Patient Details Name: Stephanie Dickerson MRN: [id number] DOB: [birth year]   Cancelled Treatment:    Reason Eval/Treat Not Completed: Fatigue/lethargy limiting ability to participate. Per nursing pt too lethargic to participate at this time.   [place] 06/08/2017, 10:37 AM [place]

## 2017-06-09 DIAGNOSIS — I639 Cerebral infarction, unspecified: Secondary | ICD-10-CM

## 2017-06-09 DIAGNOSIS — Z515 Encounter for palliative care: Secondary | ICD-10-CM

## 2017-06-09 NOTE — Progress Notes (Signed)
PROGRESS NOTE  Stephanie Dickerson YQI:[id number] DOB: 1923/08/05 DOA: 06/09/2017 PCP: Lucretia Kern, DO  HPI/Recap of past 38 hours: 84 year old female who presented with worsening dyspnea. Patient with a significant past medical history for hypertension, dyslipidemia, history of breast cancer and depression. Patient was found with significant respiratory distress while lying in her bed, she was confused and lethargic. Apparently she had developed worsening dyspnea for last 3 to 4 days that initially was on exertion, and preceded by an upper respiratory tract infection, that was diagnosed about 2 weeks ago. Her functional physical capacity has been significantly decreased after a hamstring tear. On initial physical examination she was in acute respiratory distress, her oxygen saturation was 75% and she was placed on noninvasive mechanical ventilation. Patient was admitted to the stepdown unit with working diagnosis of acute hypoxic respiratory failure due to large left pleural effusion, likely para-pneumonic effusion complicating a community acquired pneumonia.  Today, pt is minimally responsive, opens eyes.  Yelling intermittently, does not look like she is in pain.  Son at bedside.  Pt is currently hospice, awaiting placement at [facility].  Hospice team on board  Assessment/Plan: Principal Problem:   Acute respiratory failure ([place]) Active Problems:   Hyperlipidemia   Hypertension   NSTEMI (non-ST elevated myocardial infarction) ([place])   Pleural effusion   CAP (community acquired pneumonia)   Counseling regarding end of life decision making   Acute renal disease   Liver mass   Stupor   ACS (acute coronary syndrome) ([place])   Malignant pleural effusion   Advance care planning   Goals of care, counseling/discussion   Palliative care by specialist   Cerebrovascular accident (CVA) ([place])   Terminal care  Acute encephalopathy due to multiple acute infarcts Response to voice, yelling  intermittently MRI showed above Palliative on board: transitioned to comfort care  Acute hypoxic respiratoryfailuredue to left para-pneumonic pleural effusion, complicated with post pleural catheter pneumothorax Still on O2  Afebrile with worsening leukocytosis CXR with resolved pneumothorax BC x 2 NGTD Pleural fluid culture and gram stain: NGTD, no wbc seen, Cytology result showed malignancy Discontinue IV antibiotic Comfort care  NSTEMI Chest pain free LV ejection fraction 45-50%, with inferior and inferior septal hypokinesis to akinesis  HTN Stable  Dyslipidemia  Depression Comfort care     Code Status: DNR  Family Communication: Discussed with son and RN  Disposition Plan: [facility] for comfort care   Consultants:  PCCM  Oncology  Palliative  Procedures:  Left chest tube placement on 06/03/17   Antimicrobials:  IV ceftriaxone  IV Azithromycin   DVT prophylaxis: SCDs   Objective: Vitals:   06/07/17 2008 06/08/17 0436 06/08/17 0517 06/09/17 0953  BP: (!) 109/44  (!) 109/58 (!) 113/47  Pulse:    77  Resp: 18     Temp: 98.2 F (36.8 C)  98.5 F (36.9 C) (!) 96.9 F (36.1 C)  TempSrc: Oral  Axillary Axillary  SpO2: 100%  95% 92%  Weight:  76.7 kg (169 lb 1.5 oz)    Height:        Intake/Output Summary (Last 24 hours) at 06/09/2017 1837 Last data filed at 06/09/2017 0540 Gross per 24 hour  Intake 0 ml  Output 480 ml  Net -480 ml   Filed Weights   06/06/17 0307 06/07/17 0351 06/08/17 0436  Weight: 82.7 kg (182 lb 5.1 oz) 79.6 kg (175 lb 7.8 oz) 76.7 kg (169 lb 1.5 oz)    Exam:   General: Minimally  responsive, yelling intermittently  Cardiovascular: S1, S2 present, no added hrt sound  Respiratory: Decreased BS at the bases   Abdomen: Mildly distended, soft, nontender, bowel sounds present  Musculoskeletal: No pedal edema bilaterally  Skin: Normal  Psychiatry: Unable to assess   Data Reviewed: CBC: Recent Labs   Lab 06/03/17 1900  06/05/17 0920 06/06/17 0337 06/07/17 0307 06/07/17 2248 06/08/17 0315  WBC 15.3*   < > 20.3* 22.5* 25.7* 27.0* 28.1*  NEUTROABS 12.5*  --   --   --  21.1*  --  24.1*  HGB 9.1*   < > 10.3* 10.7* 11.0* 11.4* 10.6*  HCT 29.3*   < > 33.8* 33.7* 35.6* 36.9 34.7*  MCV 95.8   < > 94.2 93.1 92.2 93.4 93.5  PLT 215   < > 200 201 172 175 156   < > = values in this interval not displayed.   Basic Metabolic Panel: Recent Labs  Lab 06/03/17 0246 06/04/17 0305 06/06/17 0337 06/07/17 2248 06/08/17 0315  NA 141 142 140 139 140  K 5.0 5.0 4.4 5.1 4.9  CL 111 114* 109 106 107  CO2 22 20* 20* 20* 22  GLUCOSE 98 82 129* 107* 104*  BUN 38* 40* 23* 28* 27*  CREATININE 1.65* 1.57* 1.13* 1.37* 1.44*  CALCIUM 8.2* 8.0* 8.6* 8.3* 8.1*   GFR: Estimated Creatinine Clearance: 24.9 mL/min (A) (by C-G formula based on SCr of 1.44 mg/dL (H)). Liver Function Tests: Recent Labs  Lab 06/07/17 2248  AST 23  ALT 14  ALKPHOS 73  BILITOT 0.4  PROT 4.6*  ALBUMIN 2.1*   No results for input(s): LIPASE, AMYLASE in the last 168 hours. Recent Labs  Lab 06/07/17 2248  AMMONIA 22   Coagulation Profile: Recent Labs  Lab 06/03/17 0246  INR 1.19   Cardiac Enzymes: Recent Labs  Lab 06/29/2017 2251  TROPONINI 1.57*   BNP (last 3 results) No results for input(s): PROBNP in the last 8760 hours. HbA1C: No results for input(s): HGBA1C in the last 72 hours. CBG: Recent Labs  Lab 06/03/17 1137  GLUCAP 107*   Lipid Profile: No results for input(s): CHOL, HDL, LDLCALC, TRIG, CHOLHDL, LDLDIRECT in the last 72 hours. Thyroid Function Tests: No results for input(s): TSH, T4TOTAL, FREET4, T3FREE, THYROIDAB in the last 72 hours. Anemia Panel: No results for input(s): VITAMINB12, FOLATE, FERRITIN, TIBC, IRON, RETICCTPCT in the last 72 hours. Urine analysis:    Component Value Date/Time   COLORURINE YELLOW 11/26/2007 1115   APPEARANCEUR CLEAR 11/26/2007 1115   LABSPEC 1.011  11/26/2007 1115   PHURINE 7.0 11/26/2007 1115   GLUCOSEU NEGATIVE 11/26/2007 1115   HGBUR NEGATIVE 11/26/2007 1115   BILIRUBINUR NEGATIVE 11/26/2007 1115   KETONESUR NEGATIVE 11/26/2007 1115   PROTEINUR NEGATIVE 11/26/2007 1115   UROBILINOGEN 0.2 11/26/2007 1115   NITRITE NEGATIVE 11/26/2007 1115   LEUKOCYTESUR LARGE (A) 11/26/2007 1115   Sepsis Labs: @LABRCNTIP (procalcitonin:4,lacticidven:4)  ) Recent Results (from the past 240 hour(s))  Blood Culture (routine x 2)     Status: None   Collection Time: 06/12/2017  7:40 AM  Result Value Ref Range Status   Specimen Description BLOOD RIGHT WRIST  Final   Special Requests IN PEDIATRIC BOTTLE Blood Culture adequate volume  Final   Culture   Final    NO GROWTH 5 DAYS Performed at [facility], [place] [address] [place], [place] [address]    Report Status 06/07/2017 FINAL  Final  Blood Culture (routine x 2)  Status: None   Collection Time: 06/04/2017  7:51 AM  Result Value Ref Range Status   Specimen Description BLOOD RIGHT HAND  Final   Special Requests   Final    BOTTLES DRAWN AEROBIC ONLY Blood Culture adequate volume   Culture   Final    NO GROWTH 5 DAYS Performed at [facility], [address], [place], [place] [address]    Report Status 06/07/2017 FINAL  Final  Stat Gram stain     Status: None   Collection Time: 06/03/17  5:28 PM  Result Value Ref Range Status   Specimen Description PLEURAL  Final   Special Requests NONE  Final   Gram Stain   Final    NO WBC SEEN NO ORGANISMS SEEN Performed at [facility], [place] [address], [place], [place] [address]    Report Status 06/03/2017 FINAL  Final  Culture, body fluid-bottle     Status: None   Collection Time: 06/03/17  5:28 PM  Result Value Ref Range Status   Specimen Description PLEURAL  Final   Special Requests BOTTLES DRAWN AEROBIC AND ANAEROBIC  Final   Culture   Final    NO GROWTH 5 DAYS Performed at [facility], [place] [address]  [place], [place] [address]    Report Status 06/08/2017 FINAL  Final      Studies: No results found.  Scheduled Meds: . latanoprost  1 drop Both Eyes QHS    Continuous Infusions:    LOS: 7 days     Alma Friendly, MD [facility]  If 7PM-7AM, please contact night-coverage [email or web address] Password [facility] 06/09/2017, 6:37 PM

## 2017-06-09 NOTE — Progress Notes (Signed)
Nurse recommended because patient is near end of life. Met son and he said they are not religious and did not need anything. If they change their mind will call. Conard Novak, Chaplain   06/09/17 1000  Clinical Encounter Type  Visited With Other (Comment) (spoke to son)  Visit Type Initial  Referral From Nurse  Consult/Referral To Chaplain  Spiritual Encounters  Spiritual Needs Other (Comment) (son did not need services)  Stress Factors  Patient Stress Factors None identified

## 2017-06-09 NOTE — Progress Notes (Signed)
Daily Progress Note   Patient Name: Stephanie Dickerson       Date: 06/09/2017 DOB: [birth year]  Age: 84 y.o. MRN#: [id number] Attending Physician: Alma Friendly, MD Primary Care Physician: Lucretia Kern, DO Admit Date: 06/20/2017  Reason for Consultation/Follow-up: Terminal Care  Subjective: Patient in bed, son- Stephanie Dickerson at bedside. Manus Gunning does not appear to be in pain. She apparently yelled out some intermittently overnight, responds well to morphine. She opens her eyes briefly to my voice. No po intake.  Review of Systems  Unable to perform ROS: Acuity of condition    Length of Stay: 7  Current Medications: Scheduled Meds:  . latanoprost  1 drop Both Eyes QHS    Continuous Infusions:   PRN Meds: glycopyrrolate **OR** glycopyrrolate **OR** glycopyrrolate, haloperidol **OR** haloperidol **OR** haloperidol lactate, LORazepam **OR** LORazepam **OR** LORazepam, morphine injection  Physical Exam  Constitutional: She appears well-developed and well-nourished.  Cardiovascular: Normal rate.  Pulmonary/Chest: Effort normal.  Abdominal: Soft.  Neurological:  Lethargic, opens eyes briefly to voice  Nursing note and vitals reviewed.           Vital Signs: BP (!) 113/47 (BP Location: Right Arm)   Pulse 77   Temp (!) 96.9 F (36.1 C) (Axillary)   Resp 18   Ht 5\' 3"  (1.6 m) Comment: patient reported  Wt 76.7 kg (169 lb 1.5 oz)   SpO2 92%   BMI 29.95 kg/m  SpO2: SpO2: 92 % O2 Device: O2 Device: Nasal Cannula O2 Flow Rate: O2 Flow Rate (L/min): 2 L/min  Intake/output summary:   Intake/Output Summary (Last 24 hours) at 06/09/2017 1348 Last data filed at 06/09/2017 0540 Gross per 24 hour  Intake 0 ml  Output 480 ml  Net -480 ml   LBM: Last BM Date: 06/05/17 Baseline Weight:  Weight: 79.4 kg (175 lb)(patient reported) Most recent weight: Weight: 76.7 kg (169 lb 1.5 oz)       Palliative Assessment/Data: PPS: 10%     Patient Active Problem List   Diagnosis Date Noted  . ACS (acute coronary syndrome) ([place])   . Malignant pleural effusion   . Advance care planning   . Goals of care, counseling/discussion   . Palliative care by specialist   . Stupor   . Liver mass   . Acute respiratory failure ([place])  06/06/2017  . NSTEMI (non-ST elevated myocardial infarction) ([place] [facility]) 06/24/2017  . Pleural effusion 06/03/2017  . CAP (community acquired pneumonia) 06/29/2017  . Counseling regarding end of life decision making 06/22/2017  . Acute renal disease 06/25/2017  . History of breast cancer in female 12/18/2015  . Dislocated IOL (intraocular lens), posterior 12/12/2013  . Osteopenia   . Arthritis   . Cancer ([place])   . Hyperlipidemia 02/02/2012  . Hypertension 02/02/2012  . Hyperglycemia 02/02/2012  . Constipation 02/02/2012  . Anxiety/Insomnia - followed by Debbe Bales, psych nurse 02/02/2012  . Osteoarthritis - followed by Dr. Lawana Chambers in Ortho 02/02/2012  . Breast cancer ([place]) 02/02/2012    Palliative Care Assessment & Plan   Patient Profile: 84 y.o. female  with past medical history of depression, anxiety, breast cancer, osteoarthritis, HTN, hyperlipidemia, hamstring tear (leaving her basically bedbound for the last month, admitted on 06/25/2017 with increasing SOB requiring bipap. Workup revealed acute coronary syndrome with NSTEMI, pleural effusion, chest xray showed ground glass questionable for pneumonia vs malignancy. Chest tube was placed and fluid sent for pathology that shows malignant cells consistent with lung primary. CT scan of chest and abdomen showed L lung apex pleural mass suspicious for malignancy, L left pleural effusion with mass effect on upper abdomen suspicious for effusion, ground glass opacity in RUL, possible liver mass. Last night she had  worsening in mental status where she became unresponsive- responded to narcan- she had taken two norco around 1600. MRI showed several extensive acute infarcts in the bilateral cerebellum, bilateral cerebral cortex, and bilateral thalamus. Patient is now nonresponsive, no po intake.  Palliative medicine consulted for [place]. Patient transitioned to comfort measures only.   Assessment/Recommendations/Plan   Continue comfort measures as ordered  Hopeful for [facility] placement  Goals of Care and Additional Recommendations:  Limitations on Scope of Treatment: Full Comfort Care  Code Status:  DNR  Prognosis:   < 2 weeks  Discharge Planning:  Hospice facility  Care plan was discussed with patient's son- Stephanie Dickerson  Thank you for allowing the Palliative Medicine Team to assist in the care of this patient.   Time In: 1340 Time Out: 1405 Total Time 25 mins Prolonged Time Billed no      Greater than 50%  of this time was spent counseling and coordinating care related to the above assessment and plan.  Mariana Kaufman, AGNP-C Palliative Medicine   Please contact Palliative Medicine Team phone at [phone] for questions and concerns.

## 2017-06-09 NOTE — Plan of Care (Signed)
  Progressing Education: Knowledge of General Education information will improve 06/09/2017 2359 - Progressing by Blair Promise, RN Coping: Level of anxiety will decrease 06/09/2017 2359 - Progressing by Blair Promise, RN Pain Managment: General experience of comfort will improve 06/09/2017 2359 - Progressing by Blair Promise, RN Safety: Ability to remain free from injury will improve 06/09/2017 2359 - Progressing by Blair Promise, RN Skin Integrity: Risk for impaired skin integrity will decrease 06/09/2017 2359 - Progressing by Blair Promise, RN

## 2017-06-09 NOTE — Progress Notes (Addendum)
[facility] Liaison:  RN  Received request from [place], [place], for family interest in [facility].  Chart reviewed.  [place] acknowledged referral 06/08/17 with patient's son, Clair Gulling.  Unfortunately, [facility] is not able to offer a room today. CSW aware [facility] liaison will follow up with CSW and family tomorrow or sooner if room becomes available.  Please do not hesitate to call with questions.   Thank you for this referral.  Edyth Gunnels, RN, [facility] Liaison [phone]  All hospital liaisons are now on [place].

## 2017-06-10 NOTE — Progress Notes (Signed)
PROGRESS NOTE  Stephanie Dickerson QMG:[id number] DOB: 02-19-1924 DOA: 06/24/2017 PCP: Lucretia Kern, DO  HPI/Recap of past 38 hours: 84 year old female who presented with worsening dyspnea. Patient with a significant past medical history for hypertension, dyslipidemia, history of breast cancer and depression. Patient was found with significant respiratory distress while lying in her bed, she was confused and lethargic. Apparently she had developed worsening dyspnea for last 3 to 4 days that initially was on exertion, and preceded by an upper respiratory tract infection, that was diagnosed about 2 weeks ago. Her functional physical capacity has been significantly decreased after a hamstring tear. On initial physical examination she was in acute respiratory distress, her oxygen saturation was 75% and she was placed on noninvasive mechanical ventilation. Patient was admitted to the stepdown unit with working diagnosis of acute hypoxic respiratory failure due to large left pleural effusion, likely para-pneumonic effusion complicating a community acquired pneumonia.  Today, met patient resting comfortably.  Awaiting placement at [facility].  Assessment/Plan: Principal Problem:   Acute respiratory failure (HCC) Active Problems:   Hyperlipidemia   Hypertension   NSTEMI (non-ST elevated myocardial infarction) (HCC)   Pleural effusion   CAP (community acquired pneumonia)   Counseling regarding end of life decision making   Acute renal disease   Liver mass   Stupor   ACS (acute coronary syndrome) (HCC)   Malignant pleural effusion   Advance care planning   Goals of care, counseling/discussion   Palliative care by specialist   Cerebrovascular accident (CVA) ([place])   Terminal care  Acute encephalopathy due to multiple acute infarcts Response to voice, yelling intermittently MRI showed above Palliative on board: transitioned to comfort care  Acute hypoxic respiratoryfailuredue to left  para-pneumonic pleural effusion, complicated with post pleural catheter pneumothorax Still on O2 [place] Afebrile with worsening leukocytosis CXR with resolved pneumothorax BC x 2 NGTD Pleural fluid culture and gram stain: NGTD, no wbc seen, Cytology result showed malignancy Discontinue IV antibiotic Comfort care  NSTEMI Chest pain free LV ejection fraction 45-50%, with inferior and inferior septal hypokinesis to akinesis  HTN Stable  Dyslipidemia  Depression Comfort care     Code Status: DNR  Family Communication: Discussed with son  Disposition Plan: [facility] for comfort care   Consultants:  PCCM  Oncology  Palliative  Procedures:  Left chest tube placement on 06/03/17   Antimicrobials:  IV ceftriaxone  IV Azithromycin   DVT prophylaxis: SCDs   Objective: Vitals:   06/08/17 0436 06/08/17 0517 06/09/17 0953 06/10/17 0127  BP:  (!) 109/58 (!) 113/47 (!) 108/40  Pulse:   77 86  Resp:    12  Temp:  98.5 F (36.9 C) (!) 96.9 F (36.1 C) 98.3 F (36.8 C)  TempSrc:  Axillary Axillary Axillary  SpO2:  95% 92% 96%  Weight: 76.7 kg (169 lb 1.5 oz)     Height:        Intake/Output Summary (Last 24 hours) at 06/10/2017 1717 Last data filed at 06/10/2017 0323 Gross per 24 hour  Intake -  Output 190 ml  Net -190 ml   Filed Weights   06/06/17 0307 06/07/17 0351 06/08/17 0436  Weight: 82.7 kg (182 lb 5.1 oz) 79.6 kg (175 lb 7.8 oz) 76.7 kg (169 lb 1.5 oz)    Exam:   General: Resting, looked comfortable  Cardiovascular: S1, S2 present, no added hrt sound  Respiratory: Decreased BS at the bases   Abdomen: Mildly distended, soft, nontender, bowel sounds present  Musculoskeletal: No pedal edema bilaterally  Skin: Normal  Psychiatry: Unable to assess   Data Reviewed: CBC: Recent Labs  Lab 06/03/17 1900  06/05/17 0920 06/06/17 0337 06/07/17 0307 06/07/17 2248 06/08/17 0315  WBC 15.3*   < > 20.3* 22.5* 25.7* 27.0* 28.1*  NEUTROABS  12.5*  --   --   --  21.1*  --  24.1*  HGB 9.1*   < > 10.3* 10.7* 11.0* 11.4* 10.6*  HCT 29.3*   < > 33.8* 33.7* 35.6* 36.9 34.7*  MCV 95.8   < > 94.2 93.1 92.2 93.4 93.5  PLT 215   < > 200 201 172 175 156   < > = values in this interval not displayed.   Basic Metabolic Panel: Recent Labs  Lab 06/04/17 0305 06/06/17 0337 06/07/17 2248 06/08/17 0315  NA 142 140 139 140  K 5.0 4.4 5.1 4.9  CL 114* 109 106 107  CO2 20* 20* 20* 22  GLUCOSE 82 129* 107* 104*  BUN 40* 23* 28* 27*  CREATININE 1.57* 1.13* 1.37* 1.44*  CALCIUM 8.0* 8.6* 8.3* 8.1*   GFR: Estimated Creatinine Clearance: 24.9 mL/min (A) (by C-G formula based on SCr of 1.44 mg/dL (H)). Liver Function Tests: Recent Labs  Lab 06/07/17 2248  AST 23  ALT 14  ALKPHOS 73  BILITOT 0.4  PROT 4.6*  ALBUMIN 2.1*   No results for input(s): LIPASE, AMYLASE in the last 168 hours. Recent Labs  Lab 06/07/17 2248  AMMONIA 22   Coagulation Profile: No results for input(s): INR, PROTIME in the last 168 hours. Cardiac Enzymes: No results for input(s): CKTOTAL, CKMB, CKMBINDEX, TROPONINI in the last 168 hours. BNP (last 3 results) No results for input(s): PROBNP in the last 8760 hours. HbA1C: No results for input(s): HGBA1C in the last 72 hours. CBG: No results for input(s): GLUCAP in the last 168 hours. Lipid Profile: No results for input(s): CHOL, HDL, LDLCALC, TRIG, CHOLHDL, LDLDIRECT in the last 72 hours. Thyroid Function Tests: No results for input(s): TSH, T4TOTAL, FREET4, T3FREE, THYROIDAB in the last 72 hours. Anemia Panel: No results for input(s): VITAMINB12, FOLATE, FERRITIN, TIBC, IRON, RETICCTPCT in the last 72 hours. Urine analysis:    Component Value Date/Time   COLORURINE YELLOW 11/26/2007 1115   APPEARANCEUR CLEAR 11/26/2007 1115   LABSPEC 1.011 11/26/2007 1115   PHURINE 7.0 11/26/2007 1115   GLUCOSEU NEGATIVE 11/26/2007 1115   HGBUR NEGATIVE 11/26/2007 1115   BILIRUBINUR NEGATIVE 11/26/2007 1115    KETONESUR NEGATIVE 11/26/2007 1115   PROTEINUR NEGATIVE 11/26/2007 1115   UROBILINOGEN 0.2 11/26/2007 1115   NITRITE NEGATIVE 11/26/2007 1115   LEUKOCYTESUR LARGE (A) 11/26/2007 1115   Sepsis Labs: @LABRCNTIP (procalcitonin:4,lacticidven:4)  ) Recent Results (from the past 240 hour(s))  Blood Culture (routine x 2)     Status: None   Collection Time: 06/25/2017  7:40 AM  Result Value Ref Range Status   Specimen Description BLOOD RIGHT WRIST  Final   Special Requests IN PEDIATRIC BOTTLE Blood Culture adequate volume  Final   Culture   Final    NO GROWTH 5 DAYS Performed at [facility], [place] [address], [place], [place] [address]    Report Status 06/07/2017 FINAL  Final  Blood Culture (routine x 2)     Status: None   Collection Time: 06/28/2017  7:51 AM  Result Value Ref Range Status   Specimen Description BLOOD RIGHT HAND  Final   Special Requests   Final    BOTTLES DRAWN  AEROBIC ONLY Blood Culture adequate volume   Culture   Final    NO GROWTH 5 DAYS Performed at [facility], [place] [address], [place], [place] [address]    Report Status 06/07/2017 FINAL  Final  Stat Gram stain     Status: None   Collection Time: 06/03/17  5:28 PM  Result Value Ref Range Status   Specimen Description PLEURAL  Final   Special Requests NONE  Final   Gram Stain   Final    NO WBC SEEN NO ORGANISMS SEEN Performed at [facility], [place] [address] [place], [place] [address]    Report Status 06/03/2017 FINAL  Final  Culture, body fluid-bottle     Status: None   Collection Time: 06/03/17  5:28 PM  Result Value Ref Range Status   Specimen Description PLEURAL  Final   Special Requests BOTTLES DRAWN AEROBIC AND ANAEROBIC  Final   Culture   Final    NO GROWTH 5 DAYS Performed at [facility], [place] [address] [place], [place] [address]    Report Status 06/08/2017 FINAL  Final      Studies: No results found.  Scheduled Meds: . latanoprost  1 drop Both Eyes QHS     Continuous Infusions:    LOS: 8 days     Alma Friendly, MD [facility]  If 7PM-7AM, please contact night-coverage [email or web address] Password [facility] 06/10/2017, 5:17 PM

## 2017-06-10 NOTE — Progress Notes (Signed)
Clinical Social Worker following patient for Hospice placement. CSW spoke to Bevely Palmer from [facility] this morning, at this time facility does not have a bed available for patient. CSW will continue to follow family for needs.   Rhea Pink, MSW,  [place]

## 2017-06-11 MED ORDER — MORPHINE SULFATE (PF) 4 MG/ML IV SOLN
2.0000 mg | INTRAVENOUS | Status: DC
  Administered 2017-06-11 – 2017-06-13 (×10): 2 mg via INTRAVENOUS
  Filled 2017-06-11 (×10): qty 1

## 2017-06-11 MED ORDER — MORPHINE SULFATE (PF) 4 MG/ML IV SOLN
2.0000 mg | INTRAVENOUS | Status: DC | PRN
  Administered 2017-06-11 – 2017-06-13 (×2): 2 mg via INTRAVENOUS
  Filled 2017-06-11 (×2): qty 1

## 2017-06-11 MED ORDER — WHITE PETROLATUM EX OINT
TOPICAL_OINTMENT | CUTANEOUS | Status: AC
  Filled 2017-06-11: qty 28.35

## 2017-06-11 MED ORDER — MORPHINE SULFATE (PF) 4 MG/ML IV SOLN: 2.0000 mg | INTRAVENOUS | Status: DC

## 2017-06-11 NOTE — Progress Notes (Signed)
Pt resting comfortably and son is going home to do some things.

## 2017-06-11 NOTE — Progress Notes (Signed)
[facility] continues not to have bed today- updated pt son- he prefers to keep pt here or transfer to [facility]- would not want to consider other options unless he had to is concerned about pt comfort  CSW will continue to follow  Jorge Ny, [place] Social Worker [phone]

## 2017-06-11 NOTE — Progress Notes (Signed)
Daily Progress Note   Patient Name: Stephanie Dickerson       Date: 06/11/2017 DOB: [birth year]  Age: 84 y.o. MRN#: [id number] Attending Physician: Alma Friendly, MD Primary Care Physician: Lucretia Kern, DO Admit Date: 06/06/2017  Reason for Consultation/Follow-up: Establishing goals of care and Terminal Care  Subjective: Patient in bed. Moaning quietly. Son says she has been requiring less pain medication today. Notes she has been less verbal. Opens eyes to voice. No po intake.  Discussed scheduling pain medication with Clair Gulling. I have concerns that she remains in pain, but has lost the strength to communicate it to [place]. He agrees. Will schedule morphine.   Review of Systems  Unable to perform ROS: Acuity of condition    Length of Stay: 9  Current Medications: Scheduled Meds:  . latanoprost  1 drop Both Eyes QHS  .  morphine injection  2 mg Intravenous Q4H    Continuous Infusions:   PRN Meds: glycopyrrolate **OR** glycopyrrolate **OR** glycopyrrolate, haloperidol **OR** haloperidol **OR** haloperidol lactate, LORazepam **OR** LORazepam **OR** LORazepam, morphine injection  Physical Exam  Constitutional: She appears well-developed and well-nourished.  Eyes:  Fixed gaze  Cardiovascular:  tachycardic  Pulmonary/Chest: Tachypnea noted. She has rales.  Abdominal: Soft. Bowel sounds are normal.  Neurological:  Opens eyes to voice, nonverbal  Nursing note and vitals reviewed.           Vital Signs: BP (!) 99/37 (BP Location: Right Arm)   Pulse (!) 124   Temp 99.1 F (37.3 C) (Axillary)   Resp 18   Ht 5\' 3"  (1.6 m) Comment: patient reported  Wt 78 kg (171 lb 15.3 oz)   SpO2 90%   BMI 30.46 kg/m  SpO2: SpO2: 90 % O2 Device: O2 Device: Nasal Cannula O2 Flow Rate: O2  Flow Rate (L/min): 2 L/min  Intake/output summary:   Intake/Output Summary (Last 24 hours) at 06/11/2017 1548 Last data filed at 06/10/2017 2325 Gross per 24 hour  Intake -  Output 40 ml  Net -40 ml   LBM: Last BM Date: 06/05/17 Baseline Weight: Weight: 79.4 kg (175 lb)(patient reported) Most recent weight: Weight: 78 kg (171 lb 15.3 oz)       Palliative Assessment/Data: PPS: 10%      Patient Active Problem List   Diagnosis Date Noted  .  Cerebrovascular accident (CVA) ([place])   . Terminal care   . ACS (acute coronary syndrome) ([place])   . Malignant pleural effusion   . Advance care planning   . Goals of care, counseling/discussion   . Palliative care by specialist   . Stupor   . Liver mass   . Acute respiratory failure ([place]) 06/09/2017  . NSTEMI (non-ST elevated myocardial infarction) ([place]) 06/14/2017  . Pleural effusion 06/19/2017  . CAP (community acquired pneumonia) 06/19/2017  . Counseling regarding end of life decision making 06/27/2017  . Acute renal disease 06/20/2017  . History of breast cancer in female 12/18/2015  . Dislocated IOL (intraocular lens), posterior 12/12/2013  . Osteopenia   . Arthritis   . Cancer ([place])   . Hyperlipidemia 02/02/2012  . Hypertension 02/02/2012  . Hyperglycemia 02/02/2012  . Constipation 02/02/2012  . Anxiety/Insomnia - followed by Debbe Bales, psych nurse 02/02/2012  . Osteoarthritis - followed by Dr. Lawana Chambers in Ortho 02/02/2012  . Breast cancer ([place]) 02/02/2012    Palliative Care Assessment & Plan   Patient Profile: 84 y.o.femalewith past medical history of depression, anxiety, breast cancer, osteoarthritis, HTN, hyperlipidemia, hamstring tear (leaving her basically bedbound for the last month,admitted on2/1/2019with increasing SOB requiring bipap. Workup revealed acute coronary syndrome with NSTEMI, pleural effusion, chest xray showed ground glass questionable for pneumonia vs malignancy. Chest tube was placed and fluid  sent for pathology that shows malignant cells consistent with lung primary. CT scan of chest and abdomen showed L lung apex pleural mass suspicious for malignancy, L left pleural effusion with mass effect on upper abdomen suspicious for effusion, ground glass opacity in RUL, possible liver mass. Last night she had worsening in mental status where she became unresponsive- responded to narcan- she had taken two norco around 1600. MRI showed several extensive acute infarcts in the bilateral cerebellum, bilateral cerebral cortex, and bilateral thalamus. Patient is now nonresponsive, no po intake.  Palliative medicine consulted for [place]. Patient transitioned to comfort measures only.   Assessment/Recommendations/Plan   Schedule 2mg  morphine IV q4hr  Continue all other comfort interventions  Goals of Care and Additional Recommendations:  Limitations on Scope of Treatment: Full Comfort Care  Code Status:  DNR  Prognosis:   < 2 weeks   D/t malignant pleural effusion, pneumonia, several acute cerebellar infarcts, no po intake, patient transitioned to comfort care only  Discharge Planning:  Hospice facility pending acceptance and bed availability  Care plan was discussed with patient's son, Clair Gulling.  Thank you for allowing the Palliative Medicine Team to assist in the care of this patient.   Time In: 1340 Time Out: 1405 Total Time 25 mins Prolonged Time Billed No      Greater than 50%  of this time was spent counseling and coordinating care related to the above assessment and plan.  Mariana Kaufman, AGNP-C Palliative Medicine   Please contact Palliative Medicine Team phone at [phone] for questions and concerns.

## 2017-06-11 NOTE — Progress Notes (Signed)
Pt comfortable, morphine 2 mg IV given as ordered.

## 2017-06-11 NOTE — Progress Notes (Signed)
PROGRESS NOTE  Stephanie Dickerson ZOX:[id number] DOB: 03/01/1924 DOA: 06/11/2017 PCP: Lucretia Kern, DO  HPI/Recap of past 24 hours: 84 year old female who presented with worsening dyspnea. Patient with a significant past medical history for hypertension, dyslipidemia, history of breast cancer and depression. Patient was found with significant respiratory distress while lying in her bed, she was confused and lethargic. Apparently she had developed worsening dyspnea for last 3 to 4 days that initially was on exertion, and preceded by an upper respiratory tract infection, that was diagnosed about 2 weeks ago. Her functional physical capacity has been significantly decreased after a hamstring tear. On initial physical examination she was in acute respiratory distress, her oxygen saturation was 75% and she was placed on noninvasive mechanical ventilation. Patient was admitted to the stepdown unit with working diagnosis of acute hypoxic respiratory failure due to large left pleural effusion, likely para-pneumonic effusion complicating a community acquired pneumonia.  Today, met patient resting comfortably, answered son's questions. Awaiting placement at [facility].  Assessment/Plan: Principal Problem:   Acute respiratory failure (HCC) Active Problems:   Hyperlipidemia   Hypertension   NSTEMI (non-ST elevated myocardial infarction) (HCC)   Pleural effusion   CAP (community acquired pneumonia)   Counseling regarding end of life decision making   Acute renal disease   Liver mass   Stupor   ACS (acute coronary syndrome) (HCC)   Malignant pleural effusion   Advance care planning   Goals of care, counseling/discussion   Palliative care by specialist   Cerebrovascular accident (CVA) ([place])   Terminal care  Acute encephalopathy due to multiple acute infarcts MRI showed above Palliative on board: transitioned to comfort care  Acute hypoxic respiratoryfailuredue to left para-pneumonic  pleural effusion, complicated with post pleural catheter pneumothorax CXR with resolved pneumothorax BC x 2 NGTD Pleural fluid culture and gram stain: NGTD, no wbc seen, Cytology result showed malignancy Discontinue IV antibiotic Comfort care  NSTEMI Chest pain free LV ejection fraction 45-50%, with inferior and inferior septal hypokinesis to akinesis  HTN Stable  Dyslipidemia  Depression Comfort care     Code Status: DNR  Family Communication: Discussed with son  Disposition Plan: [facility] for comfort care   Consultants:  PCCM  Oncology  Palliative  Procedures:  Left chest tube placement on 06/03/17   Antimicrobials:  IV ceftriaxone  IV Azithromycin   DVT prophylaxis: SCDs   Objective: Vitals:   06/10/17 0127 06/10/17 1924 06/10/17 2255 06/10/17 2352  BP: (!) 108/40 (!) 96/52 (!) 100/47 (!) 99/37  Pulse: 86 83 82 (!) 124  Resp: 12 17 18 18   Temp: 98.3 F (36.8 C) 97.9 F (36.6 C) 98.4 F (36.9 C) 99.1 F (37.3 C)  TempSrc: Axillary Axillary Axillary Axillary  SpO2: 96% 97% 93% 90%  Weight:    78 kg (171 lb 15.3 oz)  Height:        Intake/Output Summary (Last 24 hours) at 06/11/2017 1553 Last data filed at 06/10/2017 2325 Gross per 24 hour  Intake -  Output 40 ml  Net -40 ml   Filed Weights   06/07/17 0351 06/08/17 0436 06/10/17 2352  Weight: 79.6 kg (175 lb 7.8 oz) 76.7 kg (169 lb 1.5 oz) 78 kg (171 lb 15.3 oz)    Exam:   General: Resting, looks comfortable  Cardiovascular: S1, S2 present, no added hrt sound  Respiratory: Decreased BS at the bases   Abdomen: Mildly distended, soft, nontender, bowel sounds present  Musculoskeletal: No pedal edema bilaterally  Skin: Normal  Psychiatry: Unable to assess   Data Reviewed: CBC: Recent Labs  Lab 06/05/17 0920 06/06/17 0337 06/07/17 0307 06/07/17 2248 06/08/17 0315  WBC 20.3* 22.5* 25.7* 27.0* 28.1*  NEUTROABS  --   --  21.1*  --  24.1*  HGB 10.3* 10.7* 11.0*  11.4* 10.6*  HCT 33.8* 33.7* 35.6* 36.9 34.7*  MCV 94.2 93.1 92.2 93.4 93.5  PLT 200 201 172 175 532   Basic Metabolic Panel: Recent Labs  Lab 06/06/17 0337 06/07/17 2248 06/08/17 0315  NA 140 139 140  K 4.4 5.1 4.9  CL 109 106 107  CO2 20* 20* 22  GLUCOSE 129* 107* 104*  BUN 23* 28* 27*  CREATININE 1.13* 1.37* 1.44*  CALCIUM 8.6* 8.3* 8.1*   GFR: Estimated Creatinine Clearance: 25.1 mL/min (A) (by C-G formula based on SCr of 1.44 mg/dL (H)). Liver Function Tests: Recent Labs  Lab 06/07/17 2248  AST 23  ALT 14  ALKPHOS 73  BILITOT 0.4  PROT 4.6*  ALBUMIN 2.1*   No results for input(s): LIPASE, AMYLASE in the last 168 hours. Recent Labs  Lab 06/07/17 2248  AMMONIA 22   Coagulation Profile: No results for input(s): INR, PROTIME in the last 168 hours. Cardiac Enzymes: No results for input(s): CKTOTAL, CKMB, CKMBINDEX, TROPONINI in the last 168 hours. BNP (last 3 results) No results for input(s): PROBNP in the last 8760 hours. HbA1C: No results for input(s): HGBA1C in the last 72 hours. CBG: No results for input(s): GLUCAP in the last 168 hours. Lipid Profile: No results for input(s): CHOL, HDL, LDLCALC, TRIG, CHOLHDL, LDLDIRECT in the last 72 hours. Thyroid Function Tests: No results for input(s): TSH, T4TOTAL, FREET4, T3FREE, THYROIDAB in the last 72 hours. Anemia Panel: No results for input(s): VITAMINB12, FOLATE, FERRITIN, TIBC, IRON, RETICCTPCT in the last 72 hours. Urine analysis:    Component Value Date/Time   COLORURINE YELLOW 11/26/2007 1115   APPEARANCEUR CLEAR 11/26/2007 1115   LABSPEC 1.011 11/26/2007 1115   PHURINE 7.0 11/26/2007 1115   GLUCOSEU NEGATIVE 11/26/2007 1115   HGBUR NEGATIVE 11/26/2007 1115   BILIRUBINUR NEGATIVE 11/26/2007 1115   KETONESUR NEGATIVE 11/26/2007 1115   PROTEINUR NEGATIVE 11/26/2007 1115   UROBILINOGEN 0.2 11/26/2007 1115   NITRITE NEGATIVE 11/26/2007 1115   LEUKOCYTESUR LARGE (A) 11/26/2007 1115   Sepsis  Labs: @LABRCNTIP (procalcitonin:4,lacticidven:4)  ) Recent Results (from the past 240 hour(s))  Blood Culture (routine x 2)     Status: None   Collection Time: 06/20/2017  7:40 AM  Result Value Ref Range Status   Specimen Description BLOOD RIGHT WRIST  Final   Special Requests IN PEDIATRIC BOTTLE Blood Culture adequate volume  Final   Culture   Final    NO GROWTH 5 DAYS Performed at [facility], [place] [address] [place], [place] [address]    Report Status 06/07/2017 FINAL  Final  Blood Culture (routine x 2)     Status: None   Collection Time: 06/18/2017  7:51 AM  Result Value Ref Range Status   Specimen Description BLOOD RIGHT HAND  Final   Special Requests   Final    BOTTLES DRAWN AEROBIC ONLY Blood Culture adequate volume   Culture   Final    NO GROWTH 5 DAYS Performed at [facility], [place] [address] [place], [place] [address]    Report Status 06/07/2017 FINAL  Final  Stat Gram stain     Status: None   Collection Time: 06/03/17  5:28 PM  Result Value Ref Range Status   Specimen Description PLEURAL  Final   Special Requests NONE  Final   Gram Stain   Final    NO WBC SEEN NO ORGANISMS SEEN Performed at [facility], [address], [place], [place] [address]    Report Status 06/03/2017 FINAL  Final  Culture, body fluid-bottle     Status: None   Collection Time: 06/03/17  5:28 PM  Result Value Ref Range Status   Specimen Description PLEURAL  Final   Special Requests BOTTLES DRAWN AEROBIC AND ANAEROBIC  Final   Culture   Final    NO GROWTH 5 DAYS Performed at [facility], [place] [address] [place], [place] [address]    Report Status 06/08/2017 FINAL  Final      Studies: No results found.  Scheduled Meds: . latanoprost  1 drop Both Eyes QHS  .  morphine injection  2 mg Intravenous Q4H    Continuous Infusions:    LOS: 9 days     Alma Friendly, MD [facility]  If 7PM-7AM, please contact  night-coverage [email or web address] Password [facility] 06/11/2017, 3:53 PM

## 2017-06-11 NOTE — Progress Notes (Signed)
Pt is resting comfortably with son at the bedside.  Comfort care for his needs.  Pt is not grimacing at all, breathing is not labored.

## 2017-06-11 NOTE — Progress Notes (Signed)
Pt appears very comfortable.

## 2017-06-12 DIAGNOSIS — Z515 Encounter for palliative care: Secondary | ICD-10-CM

## 2017-06-12 MED ORDER — GLYCOPYRROLATE 0.2 MG/ML IJ SOLN: 0.2000 mg | INTRAMUSCULAR | Status: AC | PRN

## 2017-06-12 MED ORDER — MORPHINE SULFATE (PF) 4 MG/ML IV SOLN: 2.0000 mg | INTRAVENOUS | 0 refills | Status: AC

## 2017-06-12 MED ORDER — LORAZEPAM 2 MG/ML IJ SOLN
1.0000 mg | INTRAMUSCULAR | Status: DC | PRN
Start: 2017-06-12 — End: 2017-06-13

## 2017-06-12 MED ORDER — HALOPERIDOL LACTATE 5 MG/ML IJ SOLN: 0.5000 mg | INTRAMUSCULAR | Status: AC | PRN

## 2017-06-12 MED ORDER — LORAZEPAM 2 MG/ML IJ SOLN
1.0000 mg | Freq: Four times a day (QID) | INTRAMUSCULAR | Status: DC
  Administered 2017-06-12 – 2017-06-13 (×3): 1 mg via INTRAVENOUS
  Filled 2017-06-12 (×3): qty 1

## 2017-06-12 MED ORDER — LORAZEPAM 2 MG/ML IJ SOLN: 1.0000 mg | INTRAMUSCULAR | 0 refills | Status: AC | PRN

## 2017-06-12 NOTE — Discharge Summary (Signed)
Discharge Summary  Stephanie Dickerson MLY:[id number] DOB: [birth year]  PCP: Lucretia Kern, DO  Admit date: 06/15/2017 Discharge date: 06/12/2017  Time spent: > 30 mins  Recommendations for Outpatient Follow-up:  None, discharge to hospice  Discharge Diagnoses:  Active Hospital Problems   Diagnosis Date Noted  . Acute respiratory failure ([place]) 06/09/2017  . Cerebrovascular accident (CVA) ([place])   . Terminal care   . ACS (acute coronary syndrome) ([place])   . Malignant pleural effusion   . Advance care planning   . Goals of care, counseling/discussion   . Palliative care by specialist   . Stupor   . Liver mass   . NSTEMI (non-ST elevated myocardial infarction) ([place]) 06/10/2017  . Pleural effusion 06/29/2017  . CAP (community acquired pneumonia) 06/08/2017  . Counseling regarding end of life decision making 06/26/2017  . Acute renal disease 06/07/2017  . Hypertension 02/02/2012  . Hyperlipidemia 02/02/2012    Resolved Hospital Problems  No resolved problems to display.    Discharge Condition:  Poor/comfort care  Diet recommendation: None  Vitals:   06/10/17 2352 06/12/17 0612  BP: (!) 99/37 (!) 89/43  Pulse: (!) 124 (!) 59  Resp: 18 17  Temp: 99.1 F (37.3 C) 100.2 F (37.9 C)  SpO2: 90% 93%    History of present illness:  84 year old female who presented with worsening dyspnea. Patient with a significant past medical history for hypertension, dyslipidemia, history of breast cancer and depression. Patient was found with significant respiratory distress while lying in her bed, she was confused and lethargic. Apparently she had developed worsening dyspnea for last 3 to 4 days that initially was on exertion, and preceded by an upper respiratory tract infection, that was diagnosed about 2 weeks ago. Her functional physical capacity has been significantly decreased after a hamstring tear. On initial physical examination she was in acute respiratory distress, her oxygen  saturation was 75% and she was placed on noninvasive mechanical ventilation. Patient was admitted to the stepdown unit with working diagnosis of acute hypoxic respiratory failure due to large left pleural effusion, likely para-pneumonic effusion complicating a community acquired pneumonia.  Today, pt in transition, currently comfort care. Son at bedside, answered all questions   Hospital Course:  Principal Problem:   Acute respiratory failure ([place]) Active Problems:   Hyperlipidemia   Hypertension   NSTEMI (non-ST elevated myocardial infarction) ([place])   Pleural effusion   CAP (community acquired pneumonia)   Counseling regarding end of life decision making   Acute renal disease   Liver mass   Stupor   ACS (acute coronary syndrome) ([place])   Malignant pleural effusion   Advance care planning   Goals of care, counseling/discussion   Palliative care by specialist   Cerebrovascular accident (CVA) ([place])   Terminal care  Acute encephalopathy due to multiple acute infarcts MRI showed above Palliative on board: transitioned to comfort care  Acute hypoxic respiratoryfailuredue to left para-pneumonic pleural effusion, complicated with post pleural catheter pneumothorax CXR with resolved pneumothorax BC x 2 NGTD Pleural fluid culture and gram stain: NGTD, no wbc seen, Cytology result showed malignancy Discontinue IV antibiotic Comfort care  NSTEMI Chest pain free LV ejection fraction 45-50%, with inferior and inferior septal hypokinesis to akinesis  HTN Stable  Dyslipidemia  Depression Comfort care   Procedures:  Left chest tube placement on 06/03/17   Consultations:  PCCM  Oncology  Palliative   Discharge Exam: BP (!) 89/43 (BP Location: Right Arm)   Pulse (!) 59  Temp 100.2 F (37.9 C) (Axillary)   Resp 17   Ht 5\' 3"  (1.6 m) Comment: patient reported  Wt 78 kg (171 lb 15.3 oz)   SpO2 93%   BMI 30.46 kg/m   General: Looks  comfortable Cardiovascular: S1, S2 present, no added hrt sound Respiratory: Decreased BS  Discharge Instructions You were cared for by a hospitalist during your hospital stay. If you have any questions about your discharge medications or the care you received while you were in the hospital after you are discharged, you can call the unit and asked to speak with the hospitalist on call if the hospitalist that took care of you is not available. Once you are discharged, your primary care physician will handle any further medical issues. Please note that NO REFILLS for any discharge medications will be authorized once you are discharged, as it is imperative that you return to your primary care physician (or establish a relationship with a primary care physician if you do not have one) for your aftercare needs so that they can reassess your need for medications and monitor your lab values.   Allergies as of 06/12/2017      Reactions   Lactose Intolerance (gi) Diarrhea      Medication List    STOP taking these medications   acetaminophen 500 MG tablet Commonly known as:  TYLENOL   amLODipine 10 MG tablet Commonly known as:  NORVASC   aspirin 81 MG tablet   atorvastatin 80 MG tablet Commonly known as:  LIPITOR   celecoxib 200 MG capsule Commonly known as:  CELEBREX   citalopram 40 MG tablet Commonly known as:  CELEXA   GLUCOSAMINE-CHONDROITIN PO   imipramine 50 MG tablet Commonly known as:  TOFRANIL   LORazepam 1 MG tablet Commonly known as:  ATIVAN Replaced by:  LORazepam 2 MG/ML injection   losartan 100 MG tablet Commonly known as:  COZAAR   methocarbamol 500 MG tablet Commonly known as:  ROBAXIN   MIRALAX PO   naproxen sodium 220 MG tablet Commonly known as:  ALEVE   TRAVATAN Z 0.004 % Soln ophthalmic solution Generic drug:  Travoprost (BAK Free)   traZODone 100 MG tablet Commonly known as:  DESYREL     TAKE these medications   glycopyrrolate 0.2 MG/ML  injection Commonly known as:  ROBINUL Inject 1 mL (0.2 mg total) into the vein every 4 (four) hours as needed (excessive secretions).   haloperidol lactate 5 MG/ML injection Commonly known as:  HALDOL Inject 0.1 mLs (0.5 mg total) into the vein every 4 (four) hours as needed (or delirium).   LORazepam 2 MG/ML injection Commonly known as:  ATIVAN Inject 0.5 mLs (1 mg total) into the vein every 4 (four) hours as needed for anxiety. Replaces:  LORazepam 1 MG tablet   morphine 4 MG/ML injection Inject 0.5 mLs (2 mg total) into the vein every 4 (four) hours.      Allergies  Allergen Reactions  . Lactose Intolerance (Gi) Diarrhea      The results of significant diagnostics from this hospitalization (including imaging, microbiology, ancillary and laboratory) are listed below for reference.    Significant Diagnostic Studies: Ct Head Wo Contrast  Result Date: 06/07/2017 CLINICAL DATA:  Altered level of consciousness, former smoker, acute respiratory failure, shortness of breath, non STEMI, large LEFT pleural effusion, history RIGHT breast cancer, hypertension EXAM: CT HEAD WITHOUT CONTRAST TECHNIQUE: Contiguous axial images were obtained from the base of the skull through the vertex without  intravenous contrast. Sagittal and coronal MPR images reconstructed from axial data set. COMPARISON:  None FINDINGS: Brain: Generalized atrophy. Normal ventricular morphology. No midline shift or mass effect. LEFT occipital infarct, probably subacute. Questionable small infarct in RIGHT occipital lobe. Patchy areas of low-attenuation in the cerebellar hemispheres which likely represent additional areas of infarction, question subacute. Extensive small vessel chronic ischemic changes of deep cerebral white matter. No intracranial hemorrhage or mass lesion. No extra-axial fluid collections. Vascular: Atherosclerotic calcifications of internal carotid and vertebral arteries at skull base Skull: Intact  Sinuses/Orbits: Clear Other: N/A IMPRESSION: Atrophy with small vessel chronic ischemic changes of deep cerebral white matter. Infarcts identified in the LEFT occipital lobe, questionably RIGHT occipital lobe, and BILATERAL cerebellar hemispheres LEFT greater than RIGHT, question subacute. No intracranial hemorrhage identified. Electronically Signed   By: Lavonia Dana M.D.   On: 06/07/2017 17:04   Ct Chest Wo Contrast  Result Date: 06/03/2017 CLINICAL DATA:  Moderate to large left pleural effusion on a portable chest earlier today. EXAM: CT CHEST WITHOUT CONTRAST TECHNIQUE: Multidetector CT imaging of the chest was performed following the standard protocol without IV contrast. COMPARISON:  Portable chest obtained earlier today. Chest CT report dated 03/15/2001. FINDINGS: Cardiovascular: Atheromatous calcifications, including the coronary arteries and aorta. Normal sized heart. Enlarged central pulmonary arteries. The right main pulmonary artery has a maximum diameter of 3.1 cm and the left main pulmonary artery has a maximum diameter of 2.5 cm. The main pulmonary artery segment has a diameter of 2.9 cm. Mediastinum/Nodes: Mildly lobulated thyroid contours with mild enlargement of the right lobe and a 7 mm low density nodule in the posterior aspect of the right lobe. There is also a hyperdense nodule arising from the inferior aspect of the thyroid gland at the isthmus on the left, measuring 1.4 cm in maximum diameter on image number 29 of series 3. Mildly prominent low density subcarinal node and similar-appearing adjacent right hilar node. The subcarinal node has a short axis diameter 9 mm on image number 75 of series 3 in the hilar node has a short axis diameter of 9 mm on image number 79 of series 3. Lungs/Pleura: Again demonstrated is a moderate to large-sized left pleural effusion with adjacent atelectasis in the left upper lobe and left lower lobe. The atelectatic left lower lobe contains multiple calcific  densities and there are small punctate calcific densities in the atelectatic left upper lobe. The bronchial walls are calcified and no endobronchial masses are identified. There is a rind of soft tissue thickening at the left lung apex anteriorly and medially, measuring 2.3 cm in thickness on image number 27 of series 3. Triangular-shaped area of focal, dense lung consolidation in the posterolateral aspect of the right middle lobe. Small amount of linear density in the right upper lobe. There is also a focal area of ground-glass interstitial opacity in the posterior aspect of the right upper lobe, measuring 3.9 x 2.3 cm on image number 28 of series 4. Upper Abdomen: Poorly defined right lobe liver mass measuring approximately 4.8 x 4.2 cm on image number 118 of series 3. The spleen is displaced medially and anteriorly by the left pleural fluid. No free peritoneal fluid is seen. Musculoskeletal: Extensive thoracic spine degenerative changes. Bilateral shoulder degenerative changes. IMPRESSION: 1. Thick rind of soft tissue at the left lung apex anteriorly and medially, suspicious for pleural based malignancy. 2. Moderate to large left pleural effusion with mass effect on the upper abdomen, suspicious for a malignant effusion. 3.  Compressive atelectasis of the left upper lobe and left lower lobe. 4. 3.9 x 2.3 cm focal ground-glass opacity in the right upper lobe. This could be infectious, inflammatory or neoplastic in nature. 5. Focal atelectasis common pneumonia or pulmonary infarction in the posterolateral aspect of the right middle lobe. 6. Mild mediastinal and right hilar adenopathy. This could represent metastatic or reactive adenopathy. 7. Prominent central pulmonary arteries, mainly involving the right main pulmonary artery. This can be seen with pulmonary arterial hypertension. 8. Poorly defined right lobe liver mass measuring approximately 4.8 x 4.2 cm. This is suspicious for a metastasis. A primary liver  neoplasm is also a possibility. 9. 1.4 cm hyperdense exophytic nodule arising from the isthmus of the thyroid gland on the left. Consider further evaluation with thyroid ultrasound. If patient is clinically hyperthyroid, consider nuclear medicine thyroid uptake and scan. 10.  Calcific coronary artery and aortic atherosclerosis. Aortic Atherosclerosis (ICD10-I70.0). Electronically Signed   By: Claudie Revering M.D.   On: 06/03/2017 15:00   Mr Brain Wo Contrast  Result Date: 06/08/2017 CLINICAL DATA:  Ataxia, stroke suspected.  Sluggish right pupil. EXAM: MRI HEAD WITHOUT CONTRAST TECHNIQUE: Multiplanar, multiecho pulse sequences of the brain and surrounding structures were obtained without intravenous contrast. COMPARISON:  Head CT from yesterday FINDINGS: Brain: Extensive acute infarction. Along the bilateral cerebral watershed distributions is extensive cortical and white matter infarct, largest and most confluent in the left parietal and bilateral occipital regions. Small acute infarcts in the bilateral thalamus and left caudate head. Numerous small to moderate acute infarcts in the bilateral cerebellum. There is a background of chronic small vessel ischemia in the cerebral white matter. Generalized cerebral volume loss that is mild to moderate. Hypointensity in the left frontal lobe on gradient imaging is most likely chronic based on axial T2 weighted imaging. No acute hemorrhage on head CT yesterday. Progressively motion degraded exam, especially affecting coronal T2 and T1 weighted imaging. Vascular: Grossly preserved major flow voids. Skull and upper cervical spine: No noted marrow lesion. Sinuses/Orbits: Bilateral cataract resection.  No acute finding. Other: These results were called by telephone at the time of interpretation on 06/08/2017 at 1:36 pm to Dr. Lesia Sago , who verbally acknowledged these results. IMPRESSION: 1. Extensive acute infarction in the bilateral cerebellum, bilateral cerebral  cortex, and bilateral thalamus. This pattern suggests central embolic disease. 2. Motion degraded. Electronically Signed   By: Monte Fantasia M.D.   On: 06/08/2017 13:37   Dg Chest Port 1 View  Result Date: 06/06/2017 CLINICAL DATA:  Left-sided chest tube. Right breast cancer. Pleural effusion. Hypertension. EXAM: PORTABLE CHEST 1 VIEW COMPARISON:  06/04/2017 FINDINGS: Left-sided chest tube is unchanged in position. Midline trachea. Cardiomegaly accentuated by AP portable technique. Small left pleural effusion is similar. No pneumothorax. No congestive failure. Right lower lobe calcified granuloma. Similar left base airspace disease. Advanced left greater than right glenohumeral joint osteoarthritis. IMPRESSION: No significant change since the prior exam. Left-sided chest tube remaining in place, with similar left pleural effusion but no pneumothorax. Cardiomegaly with left base atelectasis. Electronically Signed   By: Abigail Miyamoto M.D.   On: 06/06/2017 12:45   Dg Chest Port 1 View  Result Date: 06/04/2017 CLINICAL DATA:  Pneumothorax. EXAM: PORTABLE CHEST 1 VIEW COMPARISON:  06/03/2017 FINDINGS: Left pigtail pleural catheter remains in place. The cardiomediastinal silhouette is unchanged. No definite residual left pneumothorax is identified. There is a persistent small left pleural effusion, and there is pleural thickening at the lung apex. Right lateral costophrenic  angle blunting is unchanged without evidence of a sizable pleural effusion. Patchy left basilar airspace opacity is unchanged. The right lung remains clear. IMPRESSION: 1. No residual pneumothorax identified. 2. Unchanged small left pleural effusion and left basilar atelectasis. Electronically Signed   By: Logan Bores M.D.   On: 06/04/2017 10:03   Dg Chest Port 1 View  Result Date: 06/03/2017 CLINICAL DATA:  Chest tube placement for large left pleural effusion. EXAM: PORTABLE CHEST 1 VIEW 5:12 p.m.: COMPARISON:  CT chest and portable chest  x-ray earlier same day. FINDINGS: Pigtail catheter placed into the left pleural space with marked improvement in the left pleural effusion since earlier today. There is a small (approximate 5-10%) left apicolateral pneumothorax. A small to moderate-sized effusion persists at the base of the left hemithorax, with associated consolidation involving the left lower lobe. Right lung remains clear with mild pleuroparenchymal scarring at the right base accounting for the blunted costophrenic angle as demonstrated on the CT earlier today. IMPRESSION: 1. Marked reduction in size of the left pleural effusion after chest tube placement. A small (approximate 5-10%) left apicolateral pneumothorax is present. 2. Residual small to moderate-sized left pleural effusion with associated passive atelectasis and/or pneumonia involving the left lower lobe. These results will be called to the ordering clinician or representative by the Radiologist Assistant, and communication documented in the PACS or zVision Dashboard. Electronically Signed   By: Evangeline Dakin M.D.   On: 06/03/2017 17:57   Dg Chest Port 1 View  Result Date: 06/03/2017 CLINICAL DATA:  Pleural effusion EXAM: PORTABLE CHEST 1 VIEW COMPARISON:  06/21/2017 FINDINGS: Moderate to large left pleural effusion. Left mid/lower lung is obscured with suspected compressive atelectasis. Right lung is essentially clear. Cardiomediastinal silhouette is obscured. No pneumothorax. IMPRESSION: Moderate to large left pleural effusion, grossly unchanged. Electronically Signed   By: Julian Hy M.D.   On: 06/03/2017 09:29   Dg Chest Portable 1 View  Result Date: 06/16/2017 CLINICAL DATA:  Labored breathing today. EXAM: PORTABLE CHEST 1 VIEW COMPARISON:  PA and lateral chest 11/26/2006. FINDINGS: The patient has a large left pleural effusion with associated airspace disease. The right lung is clear. Cardiac silhouette is largely obscured. No acute bony abnormality is identified.  Postoperative change left acromioclavicular joint is seen. Severe degenerative disease about the left glenohumeral joint is noted. IMPRESSION: Large left pleural effusion and airspace disease. Electronically Signed   By: Inge Rise M.D.   On: 06/22/2017 07:47   US Abdomen Limited Ruq  Result Date: 06/04/2017 CLINICAL DATA:  Abnormal liver CT EXAM: ULTRASOUND ABDOMEN LIMITED RIGHT UPPER QUADRANT COMPARISON:  CT chest 06/03/2017 FINDINGS: Gallbladder: No gallstones or wall thickening visualized. No sonographic Murphy sign noted by sonographer. Common bile duct: Diameter: 4.2 mm Liver: At least 2 hypoechoic solid masses are visualized within the right hepatic lobe. The more posterior mass measures 3.8 x 3.4 x 4.5 cm and corresponds to the CT demonstrated mass. Smaller anterior mass measures 1.8 x 1.8 x 2.5 cm. Portal vein is patent on color Doppler imaging with normal direction of blood flow towards the liver. IMPRESSION: 1. Negative for gallstones or biliary dilatation 2. At least 2 solid-appearing hypoechoic masses in the right lobe of liver, findings would be concerning for metastatic disease Electronically Signed   By: Donavan Foil M.D.   On: 06/04/2017 22:59    Microbiology: Recent Results (from the past 240 hour(s))  Stat Gram stain     Status: None   Collection Time: 06/03/17  5:28  PM  Result Value Ref Range Status   Specimen Description PLEURAL  Final   Special Requests NONE  Final   Gram Stain   Final    NO WBC SEEN NO ORGANISMS SEEN Performed at [facility], [address] [place], [place] [address]    Report Status 06/03/2017 FINAL  Final  Culture, body fluid-bottle     Status: None   Collection Time: 06/03/17  5:28 PM  Result Value Ref Range Status   Specimen Description PLEURAL  Final   Special Requests BOTTLES DRAWN AEROBIC AND ANAEROBIC  Final   Culture   Final    NO GROWTH 5 DAYS Performed at [facility], [place] [address], [place], [place] [address]     Report Status 06/08/2017 FINAL  Final     Labs: Basic Metabolic Panel: Recent Labs  Lab 06/06/17 0337 06/07/17 2248 06/08/17 0315  NA 140 139 140  K 4.4 5.1 4.9  CL 109 106 107  CO2 20* 20* 22  GLUCOSE 129* 107* 104*  BUN 23* 28* 27*  CREATININE 1.13* 1.37* 1.44*  CALCIUM 8.6* 8.3* 8.1*   Liver Function Tests: Recent Labs  Lab 06/07/17 2248  AST 23  ALT 14  ALKPHOS 73  BILITOT 0.4  PROT 4.6*  ALBUMIN 2.1*   No results for input(s): LIPASE, AMYLASE in the last 168 hours. Recent Labs  Lab 06/07/17 2248  AMMONIA 22   CBC: Recent Labs  Lab 06/06/17 0337 06/07/17 0307 06/07/17 2248 06/08/17 0315  WBC 22.5* 25.7* 27.0* 28.1*  NEUTROABS  --  21.1*  --  24.1*  HGB 10.7* 11.0* 11.4* 10.6*  HCT 33.7* 35.6* 36.9 34.7*  MCV 93.1 92.2 93.4 93.5  PLT 201 172 175 156   Cardiac Enzymes: No results for input(s): CKTOTAL, CKMB, CKMBINDEX, TROPONINI in the last 168 hours. BNP: BNP (last 3 results) No results for input(s): BNP in the last 8760 hours.  ProBNP (last 3 results) No results for input(s): PROBNP in the last 8760 hours.  CBG: No results for input(s): GLUCAP in the last 168 hours.     Signed:  Alma Friendly, MD [facility] 06/12/2017, 2:51 PM

## 2017-06-12 NOTE — Progress Notes (Signed)
PROGRESS NOTE  Stephanie Dickerson EPP:[id number] DOB: Oct 29, 1923 DOA: 06/22/2017 PCP: Lucretia Kern, DO  HPI/Recap of past 60 hours: 84 year old female who presented with worsening dyspnea. Patient with a significant past medical history for hypertension, dyslipidemia, history of breast cancer and depression. Patient was found with significant respiratory distress while lying in her bed, she was confused and lethargic. Apparently she had developed worsening dyspnea for last 3 to 4 days that initially was on exertion, and preceded by an upper respiratory tract infection, that was diagnosed about 2 weeks ago. Her functional physical capacity has been significantly decreased after a hamstring tear. On initial physical examination she was in acute respiratory distress, her oxygen saturation was 75% and she was placed on noninvasive mechanical ventilation. Patient was admitted to the stepdown unit with working diagnosis of acute hypoxic respiratory failure due to large left pleural effusion, likely para-pneumonic effusion complicating a community acquired pneumonia.  Today, pt in transition, currently comfort care. Son at bedside, answered all questions  Assessment/Plan: Principal Problem:   Acute respiratory failure ([place]) Active Problems:   Hyperlipidemia   Hypertension   NSTEMI (non-ST elevated myocardial infarction) ([place])   Pleural effusion   CAP (community acquired pneumonia)   Counseling regarding end of life decision making   Acute renal disease   Liver mass   Stupor   ACS (acute coronary syndrome) ([facility])   Malignant pleural effusion   Advance care planning   Goals of care, counseling/discussion   Palliative care by specialist   Cerebrovascular accident (CVA) ([place])   Terminal care  Acute encephalopathy due to multiple acute infarcts MRI showed above Palliative on board: transitioned to comfort care  Acute hypoxic respiratoryfailuredue to left para-pneumonic pleural effusion,  complicated with post pleural catheter pneumothorax CXR with resolved pneumothorax BC x 2 NGTD Pleural fluid culture and gram stain: NGTD, no wbc seen, Cytology result showed malignancy Discontinue IV antibiotic Comfort care  NSTEMI Chest pain free LV ejection fraction 45-50%, with inferior and inferior septal hypokinesis to akinesis  HTN Stable  Dyslipidemia  Depression Comfort care     Code Status: DNR  Family Communication: Discussed with son  Disposition Plan: [facility] for comfort care   Consultants:  PCCM  Oncology  Palliative  Procedures:  Left chest tube placement on 06/03/17   Antimicrobials:  IV ceftriaxone  IV Azithromycin   DVT prophylaxis: SCDs   Objective: Vitals:   06/10/17 1924 06/10/17 2255 06/10/17 2352 06/12/17 0612  BP: (!) 96/52 (!) 100/47 (!) 99/37 (!) 89/43  Pulse: 83 82 (!) 124 (!) 59  Resp: 17 18 18 17   Temp: 97.9 F (36.6 C) 98.4 F (36.9 C) 99.1 F (37.3 C) 100.2 F (37.9 C)  TempSrc: Axillary Axillary Axillary Axillary  SpO2: 97% 93% 90% 93%  Weight:   78 kg (171 lb 15.3 oz)   Height:        Intake/Output Summary (Last 24 hours) at 06/12/2017 1202 Last data filed at 06/12/2017 6063 Gross per 24 hour  Intake 0 ml  Output 0 ml  Net 0 ml   Filed Weights   06/07/17 0351 06/08/17 0436 06/10/17 2352  Weight: 79.6 kg (175 lb 7.8 oz) 76.7 kg (169 lb 1.5 oz) 78 kg (171 lb 15.3 oz)    Exam:   General: Resting, looks comfortable  Cardiovascular: S1, S2 present, no added hrt sound  Respiratory: Decreased BS at the bases   Abdomen: Mildly distended, soft, nontender, bowel sounds present  Musculoskeletal: No pedal edema  bilaterally  Skin: Normal  Psychiatry: Unable to assess   Data Reviewed: CBC: Recent Labs  Lab 06/06/17 0337 06/07/17 0307 06/07/17 2248 06/08/17 0315  WBC 22.5* 25.7* 27.0* 28.1*  NEUTROABS  --  21.1*  --  24.1*  HGB 10.7* 11.0* 11.4* 10.6*  HCT 33.7* 35.6* 36.9 34.7*  MCV  93.1 92.2 93.4 93.5  PLT 201 172 175 242   Basic Metabolic Panel: Recent Labs  Lab 06/06/17 0337 06/07/17 2248 06/08/17 0315  NA 140 139 140  K 4.4 5.1 4.9  CL 109 106 107  CO2 20* 20* 22  GLUCOSE 129* 107* 104*  BUN 23* 28* 27*  CREATININE 1.13* 1.37* 1.44*  CALCIUM 8.6* 8.3* 8.1*   GFR: Estimated Creatinine Clearance: 25.1 mL/min (A) (by C-G formula based on SCr of 1.44 mg/dL (H)). Liver Function Tests: Recent Labs  Lab 06/07/17 2248  AST 23  ALT 14  ALKPHOS 73  BILITOT 0.4  PROT 4.6*  ALBUMIN 2.1*   No results for input(s): LIPASE, AMYLASE in the last 168 hours. Recent Labs  Lab 06/07/17 2248  AMMONIA 22   Coagulation Profile: No results for input(s): INR, PROTIME in the last 168 hours. Cardiac Enzymes: No results for input(s): CKTOTAL, CKMB, CKMBINDEX, TROPONINI in the last 168 hours. BNP (last 3 results) No results for input(s): PROBNP in the last 8760 hours. HbA1C: No results for input(s): HGBA1C in the last 72 hours. CBG: No results for input(s): GLUCAP in the last 168 hours. Lipid Profile: No results for input(s): CHOL, HDL, LDLCALC, TRIG, CHOLHDL, LDLDIRECT in the last 72 hours. Thyroid Function Tests: No results for input(s): TSH, T4TOTAL, FREET4, T3FREE, THYROIDAB in the last 72 hours. Anemia Panel: No results for input(s): VITAMINB12, FOLATE, FERRITIN, TIBC, IRON, RETICCTPCT in the last 72 hours. Urine analysis:    Component Value Date/Time   COLORURINE YELLOW 11/26/2007 1115   APPEARANCEUR CLEAR 11/26/2007 1115   LABSPEC 1.011 11/26/2007 1115   PHURINE 7.0 11/26/2007 1115   GLUCOSEU NEGATIVE 11/26/2007 1115   HGBUR NEGATIVE 11/26/2007 1115   BILIRUBINUR NEGATIVE 11/26/2007 1115   KETONESUR NEGATIVE 11/26/2007 1115   PROTEINUR NEGATIVE 11/26/2007 1115   UROBILINOGEN 0.2 11/26/2007 1115   NITRITE NEGATIVE 11/26/2007 1115   LEUKOCYTESUR LARGE (A) 11/26/2007 1115   Sepsis Labs: @LABRCNTIP (procalcitonin:4,lacticidven:4)  ) Recent  Results (from the past 240 hour(s))  Stat Gram stain     Status: None   Collection Time: 06/03/17  5:28 PM  Result Value Ref Range Status   Specimen Description PLEURAL  Final   Special Requests NONE  Final   Gram Stain   Final    NO WBC SEEN NO ORGANISMS SEEN Performed at [facility], [place] [address] [place], [place] [address]    Report Status 06/03/2017 FINAL  Final  Culture, body fluid-bottle     Status: None   Collection Time: 06/03/17  5:28 PM  Result Value Ref Range Status   Specimen Description PLEURAL  Final   Special Requests BOTTLES DRAWN AEROBIC AND ANAEROBIC  Final   Culture   Final    NO GROWTH 5 DAYS Performed at [facility], [place] [address], [place], [place] [address]    Report Status 06/08/2017 FINAL  Final      Studies: No results found.  Scheduled Meds: . latanoprost  1 drop Both Eyes QHS  .  morphine injection  2 mg Intravenous Q4H    Continuous Infusions:    LOS: 10 days  Alma Friendly, MD [facility]  If 7PM-7AM, please contact night-coverage [email or web address] Password [facility] 06/12/2017, 12:02 PM

## 2017-06-12 NOTE — Social Work (Addendum)
CSW alerted that [facility] has availability per admissions liaison Harmon Pier. Harmon Pier to reach out to pt son.   4:00pm- Pt RN and PMT have advised CSW that pt is not stable for transfer to [facility], MD paged.   CSW signing off. Please consult if any additional needs arise.  Alexander Mt, [place] Work [phone]

## 2017-06-12 NOTE — Progress Notes (Signed)
Palliative Medicine RN Note: Symptom follow up. I saw the patient at 1330. Breathing shallow and irregular. No breath sounds except in BUL. She is completely unresponsive. Updated PMT NP Mariana Kaufman, who does not support a move out of the hospital due to her very fragile state.  Returned to check around 1545. Breathing is even more shallow. Family reports she got a dose of morphine while I was gone and had some distressing respiratory changes. She remains unresponsive following RN's aggressive treatment of excess secretions. Pt's son is very concerned about her comfort and is requesting scheduled medications. Dr Horris Latino has cancelled d/c order. I have paged Kasie to get medications adjusted.  Patient is unstable for transport. Prognosis is minutes to hours per PMT NP & PMT Medical Director Dr Hilma Favors.   Updated son at bedside. I will follow up tomorrow am if she survives the night.  Marjie Skiff Ayla Dunigan, RN, BSN, [facility] Palliative Medicine Team 06/12/2017 4:40 PM Office [phone]

## 2017-06-12 NOTE — Progress Notes (Signed)
Nutrition Follow Up/Brief Note  Chart reviewed. Pt now transitioning to comfort care.  No further nutrition interventions warranted at this time.  Please re-consult as needed.   Arthur Holms, RD, LDN Pager #: [phone] After-Hours Pager #: [phone]

## 2017-06-13 DIAGNOSIS — I639 Cerebral infarction, unspecified: Secondary | ICD-10-CM

## 2017-06-13 DIAGNOSIS — R160 Hepatomegaly, not elsewhere classified: Secondary | ICD-10-CM

## 2017-06-13 DIAGNOSIS — J910 Malignant pleural effusion: Secondary | ICD-10-CM

## 2017-06-13 MED ORDER — SODIUM CHLORIDE 0.9 % IV SOLN
1.0000 mg/h | INTRAVENOUS | Status: DC
  Administered 2017-06-13: 1 mg/h via INTRAVENOUS
  Filled 2017-06-13: qty 10

## 2017-06-13 MED ORDER — LORAZEPAM 2 MG/ML IJ SOLN
2.0000 mg | Freq: Four times a day (QID) | INTRAMUSCULAR | Status: DC
  Administered 2017-06-13: 2 mg via INTRAVENOUS
  Filled 2017-06-13: qty 1

## 2017-06-13 MED ORDER — MORPHINE BOLUS VIA INFUSION
1.0000 mg | INTRAVENOUS | Status: DC | PRN
  Administered 2017-06-13 (×3): 1 mg via INTRAVENOUS
  Filled 2017-06-13: qty 1

## 2017-06-20 ENCOUNTER — Ambulatory Visit: Payer: Self-pay | Admitting: Family Medicine

## 2017-06-30 NOTE — Progress Notes (Signed)
Daily Progress Note   Patient Name: Stephanie Dickerson       Date: 06/20/17 DOB: May 24, 1923  Age: 84 y.o. MRN#: [id number] Attending Physician: Alma Friendly, MD Primary Care Physician: Lucretia Kern, DO Admit Date: 06/18/2017  Reason for Consultation/Follow-up: Terminal Care  Subjective: Evaluated patient with assistance of PMT RN Melanie. Patient's son at bedside.  Patient with increased RR, shallow respirations, tachycardic, peripheral pulses weak, thready, diaphoretic. Pt unresponsive. Pt actively dying. Answered son's questions related to symptom management.  Will start morphine infusion and increase lorazepam dosing to ensure comfort. D/C O2 nasal cannula and manage SOB with opioids and benzodiazepine. Patient's son is in agreement with plan.   Review of Systems  Unable to perform ROS: Patient unresponsive    Length of Stay: 11  Current Medications: Scheduled Meds:  . latanoprost  1 drop Both Eyes QHS  . LORazepam  1 mg Intravenous Q6H  .  morphine injection  2 mg Intravenous Q4H    Continuous Infusions: . morphine      PRN Meds: [DISCONTINUED] glycopyrrolate **OR** [DISCONTINUED] glycopyrrolate **OR** glycopyrrolate, [DISCONTINUED] haloperidol **OR** [DISCONTINUED] haloperidol **OR** haloperidol lactate, [DISCONTINUED] LORazepam **OR** [DISCONTINUED] LORazepam **OR** LORazepam, morphine injection, morphine  Physical Exam  Constitutional: She appears well-developed and well-nourished.  Cardiovascular: Exam reveals decreased pulses.  Pulmonary/Chest: Tachypnea noted.  Increased RR  Musculoskeletal:       Left lower leg: She exhibits edema.  Skin: Skin is warm. She is diaphoretic.  Nursing note and vitals reviewed.           Vital Signs: BP (!) 89/43 (BP  Location: Right Arm)   Pulse (!) 59   Temp 100.2 F (37.9 C) (Axillary)   Resp 17   Ht 5\' 3"  (1.6 m) Comment: patient reported  Wt 78 kg (171 lb 15.3 oz)   SpO2 93%   BMI 30.46 kg/m  SpO2: SpO2: 93 % O2 Device: O2 Device: Nasal Cannula O2 Flow Rate: O2 Flow Rate (L/min): 2 L/min  Intake/output summary:   Intake/Output Summary (Last 24 hours) at 20-Jun-2017 0948 Last data filed at 06/12/2017 1849 Gross per 24 hour  Intake 0 ml  Output 25 ml  Net -25 ml   LBM: Last BM Date: 06/05/17 Baseline Weight: Weight: 79.4 kg (175 lb)(patient reported) Most recent weight: Weight: 78 kg (171  lb 15.3 oz)       Palliative Assessment/Data: PPS: 10%     Patient Active Problem List   Diagnosis Date Noted  . Cerebrovascular accident (CVA) ([place])   . Terminal care   . ACS (acute coronary syndrome) ([place])   . Malignant pleural effusion   . Advance care planning   . Goals of care, counseling/discussion   . Palliative care by specialist   . Stupor   . Liver mass   . Acute respiratory failure ([place]) 06/29/2017  . NSTEMI (non-ST elevated myocardial infarction) ([place]) 06/14/2017  . Pleural effusion 06/04/2017  . CAP (community acquired pneumonia) 06/22/2017  . Counseling regarding end of life decision making 06/20/2017  . Acute renal disease 06/05/2017  . History of breast cancer in female 12/18/2015  . Dislocated IOL (intraocular lens), posterior 12/12/2013  . Osteopenia   . Arthritis   . Cancer ([place])   . Hyperlipidemia 02/02/2012  . Hypertension 02/02/2012  . Hyperglycemia 02/02/2012  . Constipation 02/02/2012  . Anxiety/Insomnia - followed by Debbe Bales, psych nurse 02/02/2012  . Osteoarthritis - followed by Dr. Lawana Chambers in Ortho 02/02/2012  . Breast cancer ([place]) 02/02/2012    Palliative Care Assessment & Plan   Patient Profile:  84 y.o. female  with past medical history of depression, anxiety, breast cancer, osteoarthritis, HTN, hyperlipidemia, hamstring tear (leaving her  basically bedbound for the last month, admitted on 06/24/2017 with increasing SOB requiring bipap. Workup revealed acute coronary syndrome with NSTEMI, pleural effusion, chest xray showed ground glass questionable for pneumonia vs malignancy. Chest tube was placed and fluid sent for pathology that shows malignant cells consistent with lung primary. CT scan of chest and abdomen showed L lung apex pleural mass suspicious for malignancy, L left pleural effusion with mass effect on upper abdomen suspicious for effusion, ground glass opacity in RUL, possible liver mass. Last night she had worsening in mental status where she became unresponsive- responded to narcan- she had taken two norco around 1600.  Remains lethargic today, not eating. Palliative medicine consulted for [place].     Assessment/Recommendations/Plan   Morphine continuous infusion 1mg /hr with 1mg  bolus q15 min prn sob, air hunger  Lorazepam 2mg  IV q6hr  D/C nasal cannula- provide opioids and benzo for increased WOB as ordered  Pt is not stable for transfer  Goals of Care and Additional Recommendations:  Limitations on Scope of Treatment: Full Comfort Care  Code Status:  DNR  Prognosis:   Hours - Days  Discharge Planning:  Anticipated Hospital Death  Care plan was discussed with PMT RN- Threasa Beards, pt RNJoaquim Lai, pt son.  Thank you for allowing the Palliative Medicine Team to assist in the care of this patient.   Time In: 0910 Time Out: 0950 Total Time 40 mins Prolonged Time Billed no      Greater than 50%  of this time was spent counseling and coordinating care related to the above assessment and plan.  Mariana Kaufman, AGNP-C Palliative Medicine   Please contact Palliative Medicine Team phone at [phone] for questions and concerns.

## 2017-06-30 NOTE — Progress Notes (Signed)
Wasted about 240 ml of morphine drip witnessed by Cameron Sprang, RN

## 2017-06-30 NOTE — Progress Notes (Signed)
Per NP round noted patient stop breathing, verified assessment by this writer and no resp, no pulse noted. Pronounced death by 2 RN. MD made aware , family support given. [facility] called with referral no. [phone], body is suitable for tissue donation per Marybelle Killings.

## 2017-06-30 NOTE — Death Summary Note (Signed)
Death Summary  EMIYA LOOMER MHD:[id number] DOB: [birth year] DOA: 06-29-2017  PCP: Lucretia Kern, DO  Admit date: 06-29-2017 Date of Death: Jul 10, 2017 Time of Death: 13:01 Notification: Lucretia Kern, DO notified of death of July 10, 2017   History of present illness:  84 year old female who presented with worsening dyspnea. Patient with a significant past medical history for hypertension, dyslipidemia, history of breast cancer and depression. Patient was found with significant respiratory distress while lying in her bed, she was confused and lethargic. Apparently she had developed worsening dyspnea for last 3 to 4 days that initially was on exertion, and preceded by an upper respiratory tract infection, that was diagnosed about 2 weeks ago. Her functional physical capacity has been significantly decreased after a hamstring tear. On initial physical examination she was in acute respiratory distress, her oxygen saturation was 75% and she was placed on noninvasive mechanical ventilation. Patient was admitted to the stepdown unit with working diagnosis of acute hypoxic respiratory failure due to large left pleural effusion, likely para-pneumonic effusion complicating a community acquired pneumonia.  Pt was later found to have multiple CVA, with confirmed metastatic lung CA. Due to very poor prognosis, pt was transitioned to comfort care and was placed in hospice. Today, around 13:01, pt passed away. Family notified    Final Diagnoses:  Acute encephalopathy due to multiple acute infarcts MRI showed above Palliative on board: transitioned to comfort care  Acute hypoxic respiratoryfailuredue to left para-pneumonic/malignant pleural effusion, complicated with post pleural catheter pneumothorax CXR with resolved pneumothorax BC x 2 NGTD Pleural fluid culture and gram stain: NGTD, no wbc seen, Cytology result showed malignancy Discontinue IV antibiotic Comfort care  NSTEMI Chest pain  free LV ejection fraction 45-50%, with inferior and inferior septal hypokinesis to akinesis  HTN  Dyslipidemia  Depression     The results of significant diagnostics from this hospitalization (including imaging, microbiology, ancillary and laboratory) are listed below for reference.    Significant Diagnostic Studies: Ct Head Wo Contrast  Result Date: 06/07/2017 CLINICAL DATA:  Altered level of consciousness, former smoker, acute respiratory failure, shortness of breath, non STEMI, large LEFT pleural effusion, history RIGHT breast cancer, hypertension EXAM: CT HEAD WITHOUT CONTRAST TECHNIQUE: Contiguous axial images were obtained from the base of the skull through the vertex without intravenous contrast. Sagittal and coronal MPR images reconstructed from axial data set. COMPARISON:  None FINDINGS: Brain: Generalized atrophy. Normal ventricular morphology. No midline shift or mass effect. LEFT occipital infarct, probably subacute. Questionable small infarct in RIGHT occipital lobe. Patchy areas of low-attenuation in the cerebellar hemispheres which likely represent additional areas of infarction, question subacute. Extensive small vessel chronic ischemic changes of deep cerebral white matter. No intracranial hemorrhage or mass lesion. No extra-axial fluid collections. Vascular: Atherosclerotic calcifications of internal carotid and vertebral arteries at skull base Skull: Intact Sinuses/Orbits: Clear Other: N/A IMPRESSION: Atrophy with small vessel chronic ischemic changes of deep cerebral white matter. Infarcts identified in the LEFT occipital lobe, questionably RIGHT occipital lobe, and BILATERAL cerebellar hemispheres LEFT greater than RIGHT, question subacute. No intracranial hemorrhage identified. Electronically Signed   By: Lavonia Dana M.D.   On: 06/07/2017 17:04   Ct Chest Wo Contrast  Result Date: 06/03/2017 CLINICAL DATA:  Moderate to large left pleural effusion on a portable chest  earlier today. EXAM: CT CHEST WITHOUT CONTRAST TECHNIQUE: Multidetector CT imaging of the chest was performed following the standard protocol without IV contrast. COMPARISON:  Portable chest obtained earlier today. Chest CT report dated 03/15/2001.  FINDINGS: Cardiovascular: Atheromatous calcifications, including the coronary arteries and aorta. Normal sized heart. Enlarged central pulmonary arteries. The right main pulmonary artery has a maximum diameter of 3.1 cm and the left main pulmonary artery has a maximum diameter of 2.5 cm. The main pulmonary artery segment has a diameter of 2.9 cm. Mediastinum/Nodes: Mildly lobulated thyroid contours with mild enlargement of the right lobe and a 7 mm low density nodule in the posterior aspect of the right lobe. There is also a hyperdense nodule arising from the inferior aspect of the thyroid gland at the isthmus on the left, measuring 1.4 cm in maximum diameter on image number 29 of series 3. Mildly prominent low density subcarinal node and similar-appearing adjacent right hilar node. The subcarinal node has a short axis diameter 9 mm on image number 75 of series 3 in the hilar node has a short axis diameter of 9 mm on image number 79 of series 3. Lungs/Pleura: Again demonstrated is a moderate to large-sized left pleural effusion with adjacent atelectasis in the left upper lobe and left lower lobe. The atelectatic left lower lobe contains multiple calcific densities and there are small punctate calcific densities in the atelectatic left upper lobe. The bronchial walls are calcified and no endobronchial masses are identified. There is a rind of soft tissue thickening at the left lung apex anteriorly and medially, measuring 2.3 cm in thickness on image number 27 of series 3. Triangular-shaped area of focal, dense lung consolidation in the posterolateral aspect of the right middle lobe. Small amount of linear density in the right upper lobe. There is also a focal area of  ground-glass interstitial opacity in the posterior aspect of the right upper lobe, measuring 3.9 x 2.3 cm on image number 28 of series 4. Upper Abdomen: Poorly defined right lobe liver mass measuring approximately 4.8 x 4.2 cm on image number 118 of series 3. The spleen is displaced medially and anteriorly by the left pleural fluid. No free peritoneal fluid is seen. Musculoskeletal: Extensive thoracic spine degenerative changes. Bilateral shoulder degenerative changes. IMPRESSION: 1. Thick rind of soft tissue at the left lung apex anteriorly and medially, suspicious for pleural based malignancy. 2. Moderate to large left pleural effusion with mass effect on the upper abdomen, suspicious for a malignant effusion. 3. Compressive atelectasis of the left upper lobe and left lower lobe. 4. 3.9 x 2.3 cm focal ground-glass opacity in the right upper lobe. This could be infectious, inflammatory or neoplastic in nature. 5. Focal atelectasis common pneumonia or pulmonary infarction in the posterolateral aspect of the right middle lobe. 6. Mild mediastinal and right hilar adenopathy. This could represent metastatic or reactive adenopathy. 7. Prominent central pulmonary arteries, mainly involving the right main pulmonary artery. This can be seen with pulmonary arterial hypertension. 8. Poorly defined right lobe liver mass measuring approximately 4.8 x 4.2 cm. This is suspicious for a metastasis. A primary liver neoplasm is also a possibility. 9. 1.4 cm hyperdense exophytic nodule arising from the isthmus of the thyroid gland on the left. Consider further evaluation with thyroid ultrasound. If patient is clinically hyperthyroid, consider nuclear medicine thyroid uptake and scan. 10.  Calcific coronary artery and aortic atherosclerosis. Aortic Atherosclerosis (ICD10-I70.0). Electronically Signed   By: Claudie Revering M.D.   On: 06/03/2017 15:00   Mr Brain Wo Contrast  Result Date: 06/08/2017 CLINICAL DATA:  Ataxia, stroke  suspected.  Sluggish right pupil. EXAM: MRI HEAD WITHOUT CONTRAST TECHNIQUE: Multiplanar, multiecho pulse sequences of the brain and surrounding  structures were obtained without intravenous contrast. COMPARISON:  Head CT from yesterday FINDINGS: Brain: Extensive acute infarction. Along the bilateral cerebral watershed distributions is extensive cortical and white matter infarct, largest and most confluent in the left parietal and bilateral occipital regions. Small acute infarcts in the bilateral thalamus and left caudate head. Numerous small to moderate acute infarcts in the bilateral cerebellum. There is a background of chronic small vessel ischemia in the cerebral white matter. Generalized cerebral volume loss that is mild to moderate. Hypointensity in the left frontal lobe on gradient imaging is most likely chronic based on axial T2 weighted imaging. No acute hemorrhage on head CT yesterday. Progressively motion degraded exam, especially affecting coronal T2 and T1 weighted imaging. Vascular: Grossly preserved major flow voids. Skull and upper cervical spine: No noted marrow lesion. Sinuses/Orbits: Bilateral cataract resection.  No acute finding. Other: These results were called by telephone at the time of interpretation on 06/08/2017 at 1:36 pm to Dr. Lesia Sago , who verbally acknowledged these results. IMPRESSION: 1. Extensive acute infarction in the bilateral cerebellum, bilateral cerebral cortex, and bilateral thalamus. This pattern suggests central embolic disease. 2. Motion degraded. Electronically Signed   By: Monte Fantasia M.D.   On: 06/08/2017 13:37   Dg Chest Port 1 View  Result Date: 06/06/2017 CLINICAL DATA:  Left-sided chest tube. Right breast cancer. Pleural effusion. Hypertension. EXAM: PORTABLE CHEST 1 VIEW COMPARISON:  06/04/2017 FINDINGS: Left-sided chest tube is unchanged in position. Midline trachea. Cardiomegaly accentuated by AP portable technique. Small left pleural effusion is  similar. No pneumothorax. No congestive failure. Right lower lobe calcified granuloma. Similar left base airspace disease. Advanced left greater than right glenohumeral joint osteoarthritis. IMPRESSION: No significant change since the prior exam. Left-sided chest tube remaining in place, with similar left pleural effusion but no pneumothorax. Cardiomegaly with left base atelectasis. Electronically Signed   By: Abigail Miyamoto M.D.   On: 06/06/2017 12:45   Dg Chest Port 1 View  Result Date: 06/04/2017 CLINICAL DATA:  Pneumothorax. EXAM: PORTABLE CHEST 1 VIEW COMPARISON:  06/03/2017 FINDINGS: Left pigtail pleural catheter remains in place. The cardiomediastinal silhouette is unchanged. No definite residual left pneumothorax is identified. There is a persistent small left pleural effusion, and there is pleural thickening at the lung apex. Right lateral costophrenic angle blunting is unchanged without evidence of a sizable pleural effusion. Patchy left basilar airspace opacity is unchanged. The right lung remains clear. IMPRESSION: 1. No residual pneumothorax identified. 2. Unchanged small left pleural effusion and left basilar atelectasis. Electronically Signed   By: Logan Bores M.D.   On: 06/04/2017 10:03   Dg Chest Port 1 View  Result Date: 06/03/2017 CLINICAL DATA:  Chest tube placement for large left pleural effusion. EXAM: PORTABLE CHEST 1 VIEW 5:12 p.m.: COMPARISON:  CT chest and portable chest x-ray earlier same day. FINDINGS: Pigtail catheter placed into the left pleural space with marked improvement in the left pleural effusion since earlier today. There is a small (approximate 5-10%) left apicolateral pneumothorax. A small to moderate-sized effusion persists at the base of the left hemithorax, with associated consolidation involving the left lower lobe. Right lung remains clear with mild pleuroparenchymal scarring at the right base accounting for the blunted costophrenic angle as demonstrated on the CT  earlier today. IMPRESSION: 1. Marked reduction in size of the left pleural effusion after chest tube placement. A small (approximate 5-10%) left apicolateral pneumothorax is present. 2. Residual small to moderate-sized left pleural effusion with associated passive atelectasis and/or pneumonia  involving the left lower lobe. These results will be called to the ordering clinician or representative by the Radiologist Assistant, and communication documented in the PACS or zVision Dashboard. Electronically Signed   By: Evangeline Dakin M.D.   On: 06/03/2017 17:57   Dg Chest Port 1 View  Result Date: 06/03/2017 CLINICAL DATA:  Pleural effusion EXAM: PORTABLE CHEST 1 VIEW COMPARISON:  06/21/2017 FINDINGS: Moderate to large left pleural effusion. Left mid/lower lung is obscured with suspected compressive atelectasis. Right lung is essentially clear. Cardiomediastinal silhouette is obscured. No pneumothorax. IMPRESSION: Moderate to large left pleural effusion, grossly unchanged. Electronically Signed   By: Julian Hy M.D.   On: 06/03/2017 09:29   Dg Chest Portable 1 View  Result Date: 06/17/2017 CLINICAL DATA:  Labored breathing today. EXAM: PORTABLE CHEST 1 VIEW COMPARISON:  PA and lateral chest 11/26/2006. FINDINGS: The patient has a large left pleural effusion with associated airspace disease. The right lung is clear. Cardiac silhouette is largely obscured. No acute bony abnormality is identified. Postoperative change left acromioclavicular joint is seen. Severe degenerative disease about the left glenohumeral joint is noted. IMPRESSION: Large left pleural effusion and airspace disease. Electronically Signed   By: Inge Rise M.D.   On: 06/24/2017 07:47   US Abdomen Limited Ruq  Result Date: 06/04/2017 CLINICAL DATA:  Abnormal liver CT EXAM: ULTRASOUND ABDOMEN LIMITED RIGHT UPPER QUADRANT COMPARISON:  CT chest 06/03/2017 FINDINGS: Gallbladder: No gallstones or wall thickening visualized. No sonographic  Murphy sign noted by sonographer. Common bile duct: Diameter: 4.2 mm Liver: At least 2 hypoechoic solid masses are visualized within the right hepatic lobe. The more posterior mass measures 3.8 x 3.4 x 4.5 cm and corresponds to the CT demonstrated mass. Smaller anterior mass measures 1.8 x 1.8 x 2.5 cm. Portal vein is patent on color Doppler imaging with normal direction of blood flow towards the liver. IMPRESSION: 1. Negative for gallstones or biliary dilatation 2. At least 2 solid-appearing hypoechoic masses in the right lobe of liver, findings would be concerning for metastatic disease Electronically Signed   By: Donavan Foil M.D.   On: 06/04/2017 22:59    Microbiology: No results found for this or any previous visit (from the past 240 hour(s)).   Labs: Basic Metabolic Panel: Recent Labs  Lab 06/07/17 2248 06/08/17 0315  NA 139 140  K 5.1 4.9  CL 106 107  CO2 20* 22  GLUCOSE 107* 104*  BUN 28* 27*  CREATININE 1.37* 1.44*  CALCIUM 8.3* 8.1*   Liver Function Tests: Recent Labs  Lab 06/07/17 2248  AST 23  ALT 14  ALKPHOS 73  BILITOT 0.4  PROT 4.6*  ALBUMIN 2.1*   No results for input(s): LIPASE, AMYLASE in the last 168 hours. Recent Labs  Lab 06/07/17 2248  AMMONIA 22   CBC: Recent Labs  Lab 06/07/17 0307 06/07/17 2248 06/08/17 0315  WBC 25.7* 27.0* 28.1*  NEUTROABS 21.1*  --  24.1*  HGB 11.0* 11.4* 10.6*  HCT 35.6* 36.9 34.7*  MCV 92.2 93.4 93.5  PLT 172 175 156   Cardiac Enzymes: No results for input(s): CKTOTAL, CKMB, CKMBINDEX, TROPONINI in the last 168 hours. D-Dimer No results for input(s): DDIMER in the last 72 hours. BNP: Invalid input(s): POCBNP CBG: No results for input(s): GLUCAP in the last 168 hours. Anemia work up No results for input(s): VITAMINB12, FOLATE, FERRITIN, TIBC, IRON, RETICCTPCT in the last 72 hours. Urinalysis    Component Value Date/Time   COLORURINE YELLOW 11/26/2007 1115  APPEARANCEUR CLEAR 11/26/2007 1115   LABSPEC  1.011 11/26/2007 1115   PHURINE 7.0 11/26/2007 1115   GLUCOSEU NEGATIVE 11/26/2007 1115   HGBUR NEGATIVE 11/26/2007 1115   BILIRUBINUR NEGATIVE 11/26/2007 1115   KETONESUR NEGATIVE 11/26/2007 1115   PROTEINUR NEGATIVE 11/26/2007 1115   UROBILINOGEN 0.2 11/26/2007 1115   NITRITE NEGATIVE 11/26/2007 1115   LEUKOCYTESUR LARGE (A) 11/26/2007 1115   Sepsis Labs Invalid input(s): PROCALCITONIN,  WBC,  LACTICIDVEN     SIGNED:  Alma Friendly, MD  [facility] 2017-07-12, 6:03 PM Pager   If 7PM-7AM, please contact night-coverage [email or web address] Password [email or web address]

## 2017-06-30 NOTE — Progress Notes (Signed)
Palliative Medicine RN Note: Arrived to room for symptom check. Son immediately informed me that he thought Mrs Stephanie Dickerson had just died. She made a few more gasps, but stopped breathing at 1301. Confirmed absence of pulse, no heart beat, no breathing. Verified by RN Joaquim Lai.  Bruce (son at bedside) is sad, but relieved. He verbalized appreciation for the peacefulness of her death. He would like to spend some time with her before we bathe her or move her. I updated RN Joaquim Lai.   Marjie Skiff Hendrik Donath, RN, BSN, [facility] Palliative Medicine Team 06-30-2017 1:39 PM Office [phone]

## 2017-06-30 DEATH — deceased

## 2017-09-26 ENCOUNTER — Ambulatory Visit: Payer: Medicare Other | Admitting: Family Medicine

## 2020-01-26 IMAGING — CT CT CHEST W/O CM
2 of 4 series · 14 of 36 positions shown, 17 images · non-contrast
Comparison: Portable chest obtained earlier today. Chest CT report
dated 03/15/2001.

CLINICAL DATA: Moderate to large left pleural effusion on a
portable chest earlier today.

EXAM:
CT CHEST WITHOUT CONTRAST
TECHNIQUE: Multidetector CT imaging of the chest was performed following the
standard protocol without IV contrast.

[Series 3: chest wo · axial · 0.73mm/px · z∈[+1359,+1601]mm · 11 of 145 slices shown, 14 images]
[im 12/145  mediastinal]
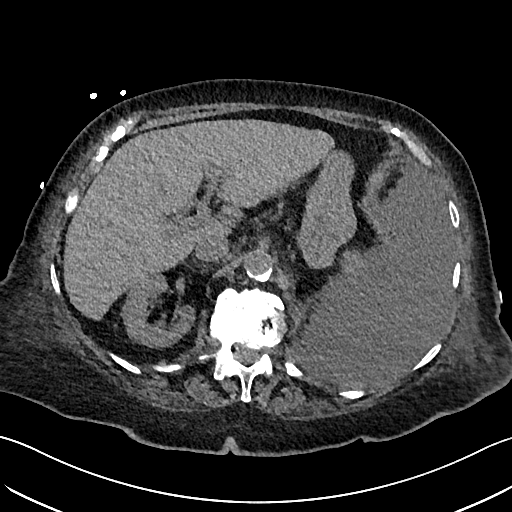
[im 12/145  lung]
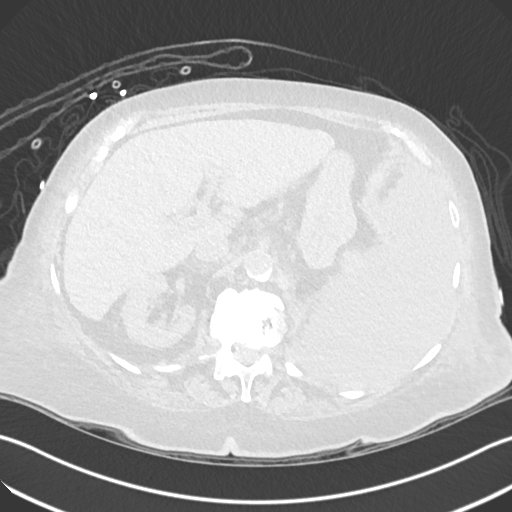
[im 23/145  lung]
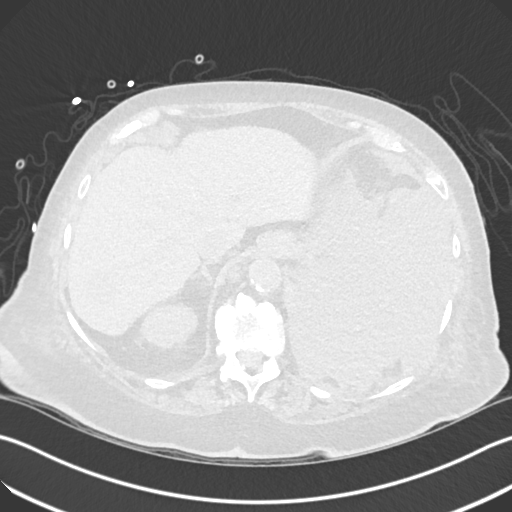
[im 34/145  lung]
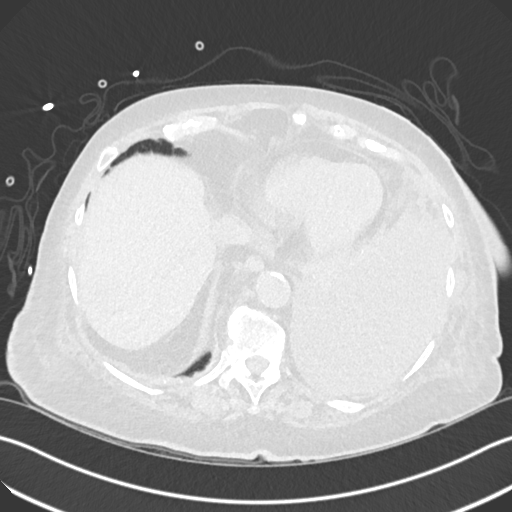
[im 45/145  lung]
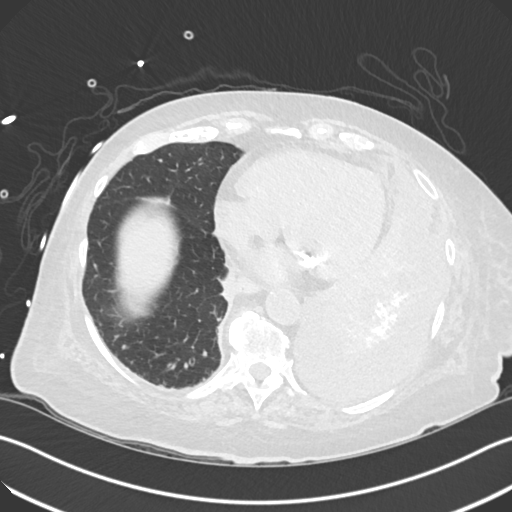
[im 56/145  mediastinal]
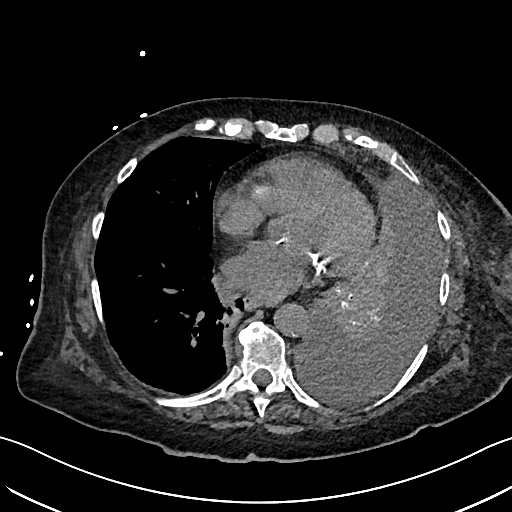
[im 56/145  lung]
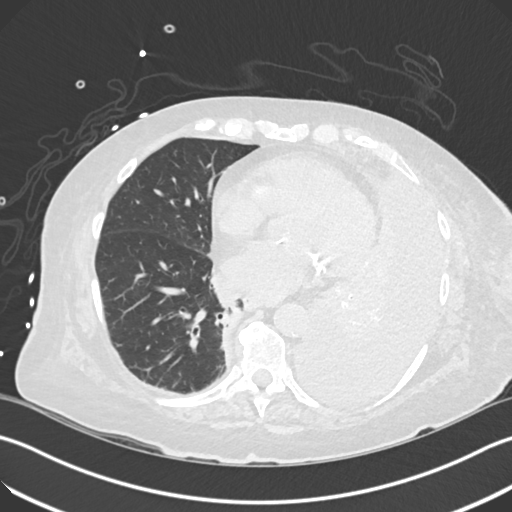
[im 78/145  lung]
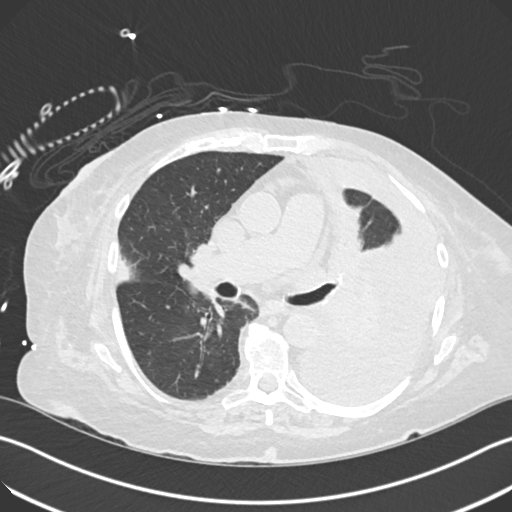
[im 89/145  lung]
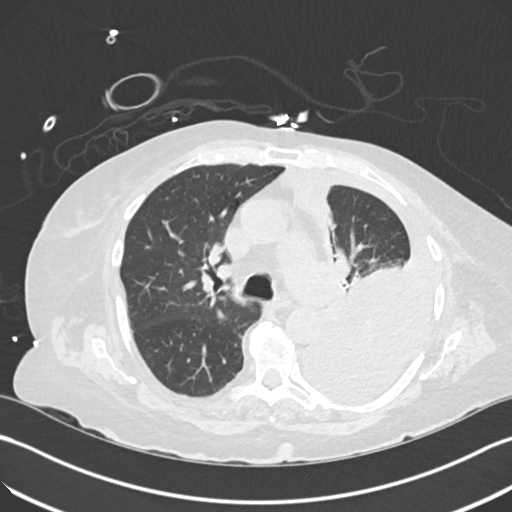
[im 100/145  lung]
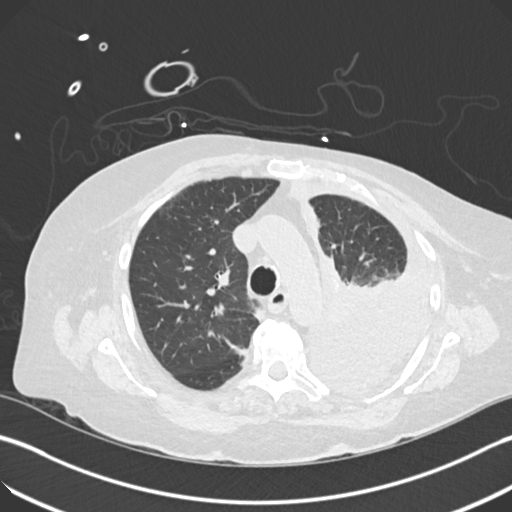
[im 111/145  mediastinal]
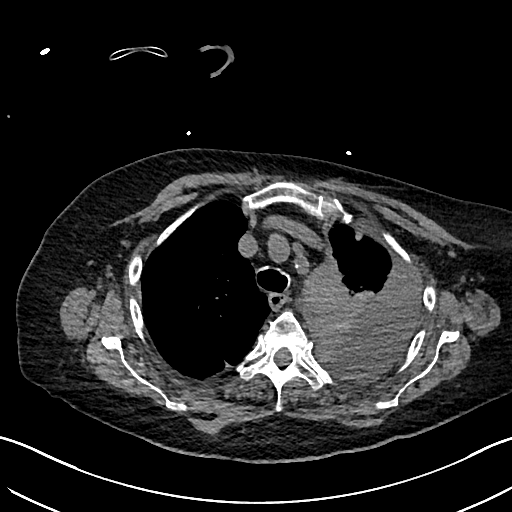
[im 111/145  lung]
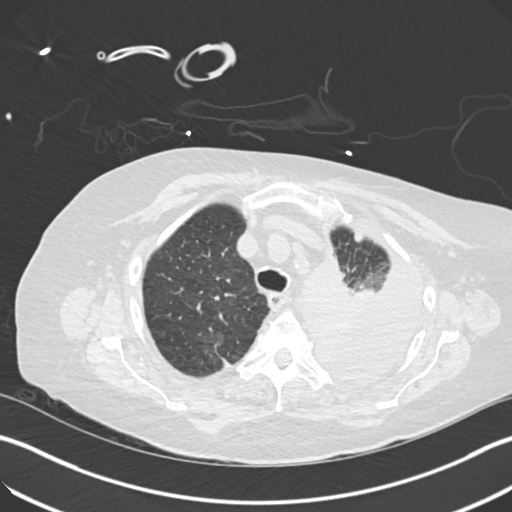
[im 122/145  lung]
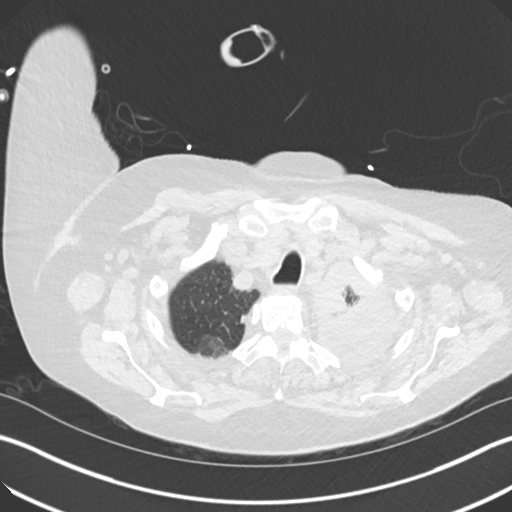
[im 133/145  lung]
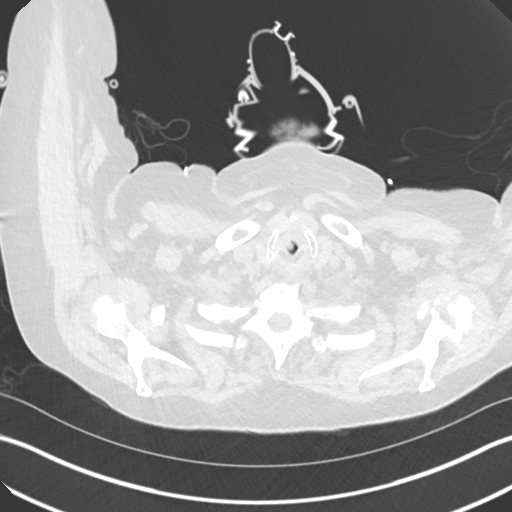

[Series 6: cor · coronal · 0.60mm/px · 3 of 121 slices shown]
[im 25/121  lung]
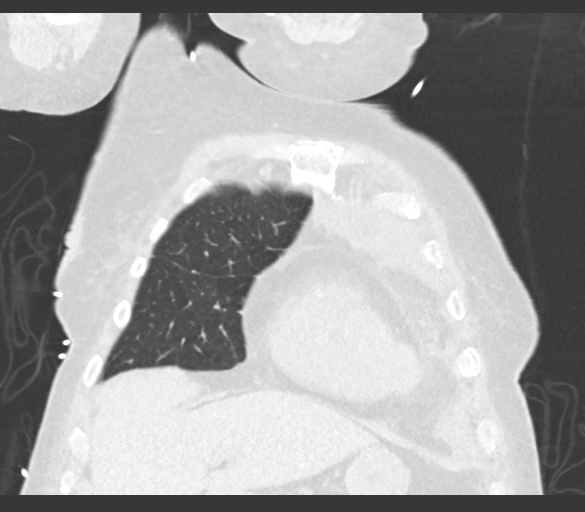
[im 49/121  lung]
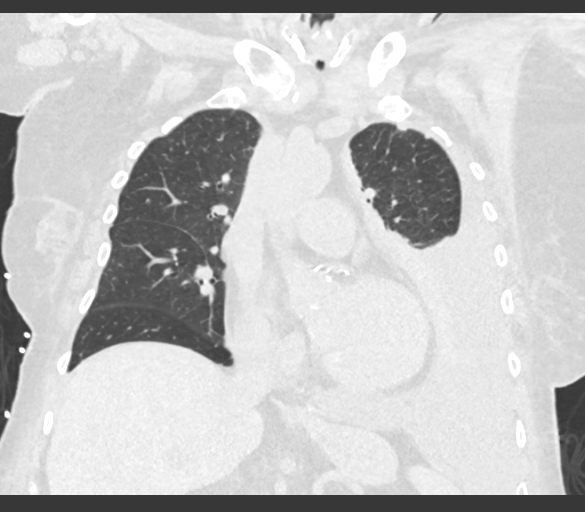
[im 73/121  lung]
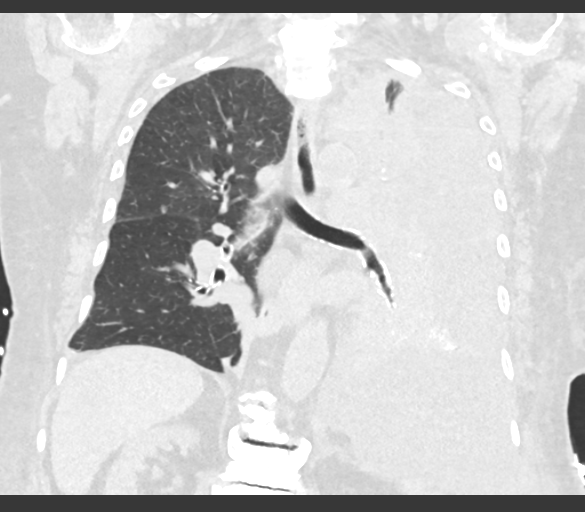

[14 of 36 positions shown; findings below may reference images not displayed]

FINDINGS: Cardiovascular: Atheromatous calcifications, including the coronary
arteries and aorta. Normal sized heart. Enlarged central pulmonary
arteries. The right main pulmonary artery has a maximum diameter of
3.1 cm and the left main pulmonary artery has a maximum diameter of
2.5 cm. The main pulmonary artery segment has a diameter of 2.9 cm.

Mediastinum/Nodes: Mildly lobulated thyroid contours with mild
enlargement of the right lobe and a 7 mm low density nodule in the
posterior aspect of the right lobe. There is also a hyperdense
nodule arising from the inferior aspect of the thyroid gland at the
isthmus on the left, measuring 1.4 cm in maximum diameter on image
number 29 of series 3.

Mildly prominent low density subcarinal node and similar-appearing
adjacent right hilar node. The subcarinal node has a short axis
diameter 9 mm on image number 75 of series 3 in the hilar node has a
short axis diameter of 9 mm on image number 79 of series 3.

Lungs/Pleura: Again demonstrated is a moderate to large-sized left
pleural effusion with adjacent atelectasis in the left upper lobe
and left lower lobe.

The atelectatic left lower lobe contains multiple calcific densities
and there are small punctate calcific densities in the atelectatic
left upper lobe. The bronchial walls are calcified and no
endobronchial masses are identified. There is a rind of soft tissue
thickening at the left lung apex anteriorly and medially, measuring
2.3 cm in thickness on image number 27 of series 3.

Triangular-shaped area of focal, dense lung consolidation in the
posterolateral aspect of the right middle lobe. Small amount of
linear density in the right upper lobe. There is also a focal area
of ground-glass interstitial opacity in the posterior aspect of the
right upper lobe, measuring 3.9 x 2.3 cm on image number 28 of
series 4.

Upper Abdomen: Poorly defined right lobe liver mass measuring
approximately 4.8 x 4.2 cm on image number 118 of series 3. The
spleen is displaced medially and anteriorly by the left pleural
fluid. No free peritoneal fluid is seen.

Musculoskeletal: Extensive thoracic spine degenerative changes.
Bilateral shoulder degenerative changes.
IMPRESSION: 1. Thick rind of soft tissue at the left lung apex anteriorly and
medially, suspicious for pleural based malignancy.
2. Moderate to large left pleural effusion with mass effect on the
upper abdomen, suspicious for a malignant effusion.
3. Compressive atelectasis of the left upper lobe and left lower
lobe.
4. 3.9 x 2.3 cm focal ground-glass opacity in the right upper lobe.
This could be infectious, inflammatory or neoplastic in nature.
5. Focal atelectasis common pneumonia or pulmonary infarction in the
posterolateral aspect of the right middle lobe.
6. Mild mediastinal and right hilar adenopathy. This could represent
metastatic or reactive adenopathy.
7. Prominent central pulmonary arteries, mainly involving the right
main pulmonary artery. This can be seen with pulmonary arterial
hypertension.
8. Poorly defined right lobe liver mass measuring approximately
x 4.2 cm. This is suspicious for a metastasis. A primary liver
neoplasm is also a possibility.
9. 1.4 cm hyperdense exophytic nodule arising from the isthmus of
the thyroid gland on the left. Consider further evaluation with
thyroid ultrasound. If patient is clinically hyperthyroid, consider
nuclear medicine thyroid uptake and scan.
10.  Calcific coronary artery and aortic atherosclerosis.

Aortic Atherosclerosis ([id number]).

## 2020-01-29 IMAGING — DX DG CHEST 1V PORT
1 series · 1 of 1 positions shown · non-contrast
Comparison: 06/04/2017

CLINICAL DATA: Left-sided chest tube. Right breast cancer. Pleural
effusion. Hypertension.

EXAM:
PORTABLE CHEST 1 VIEW

[chest ap]
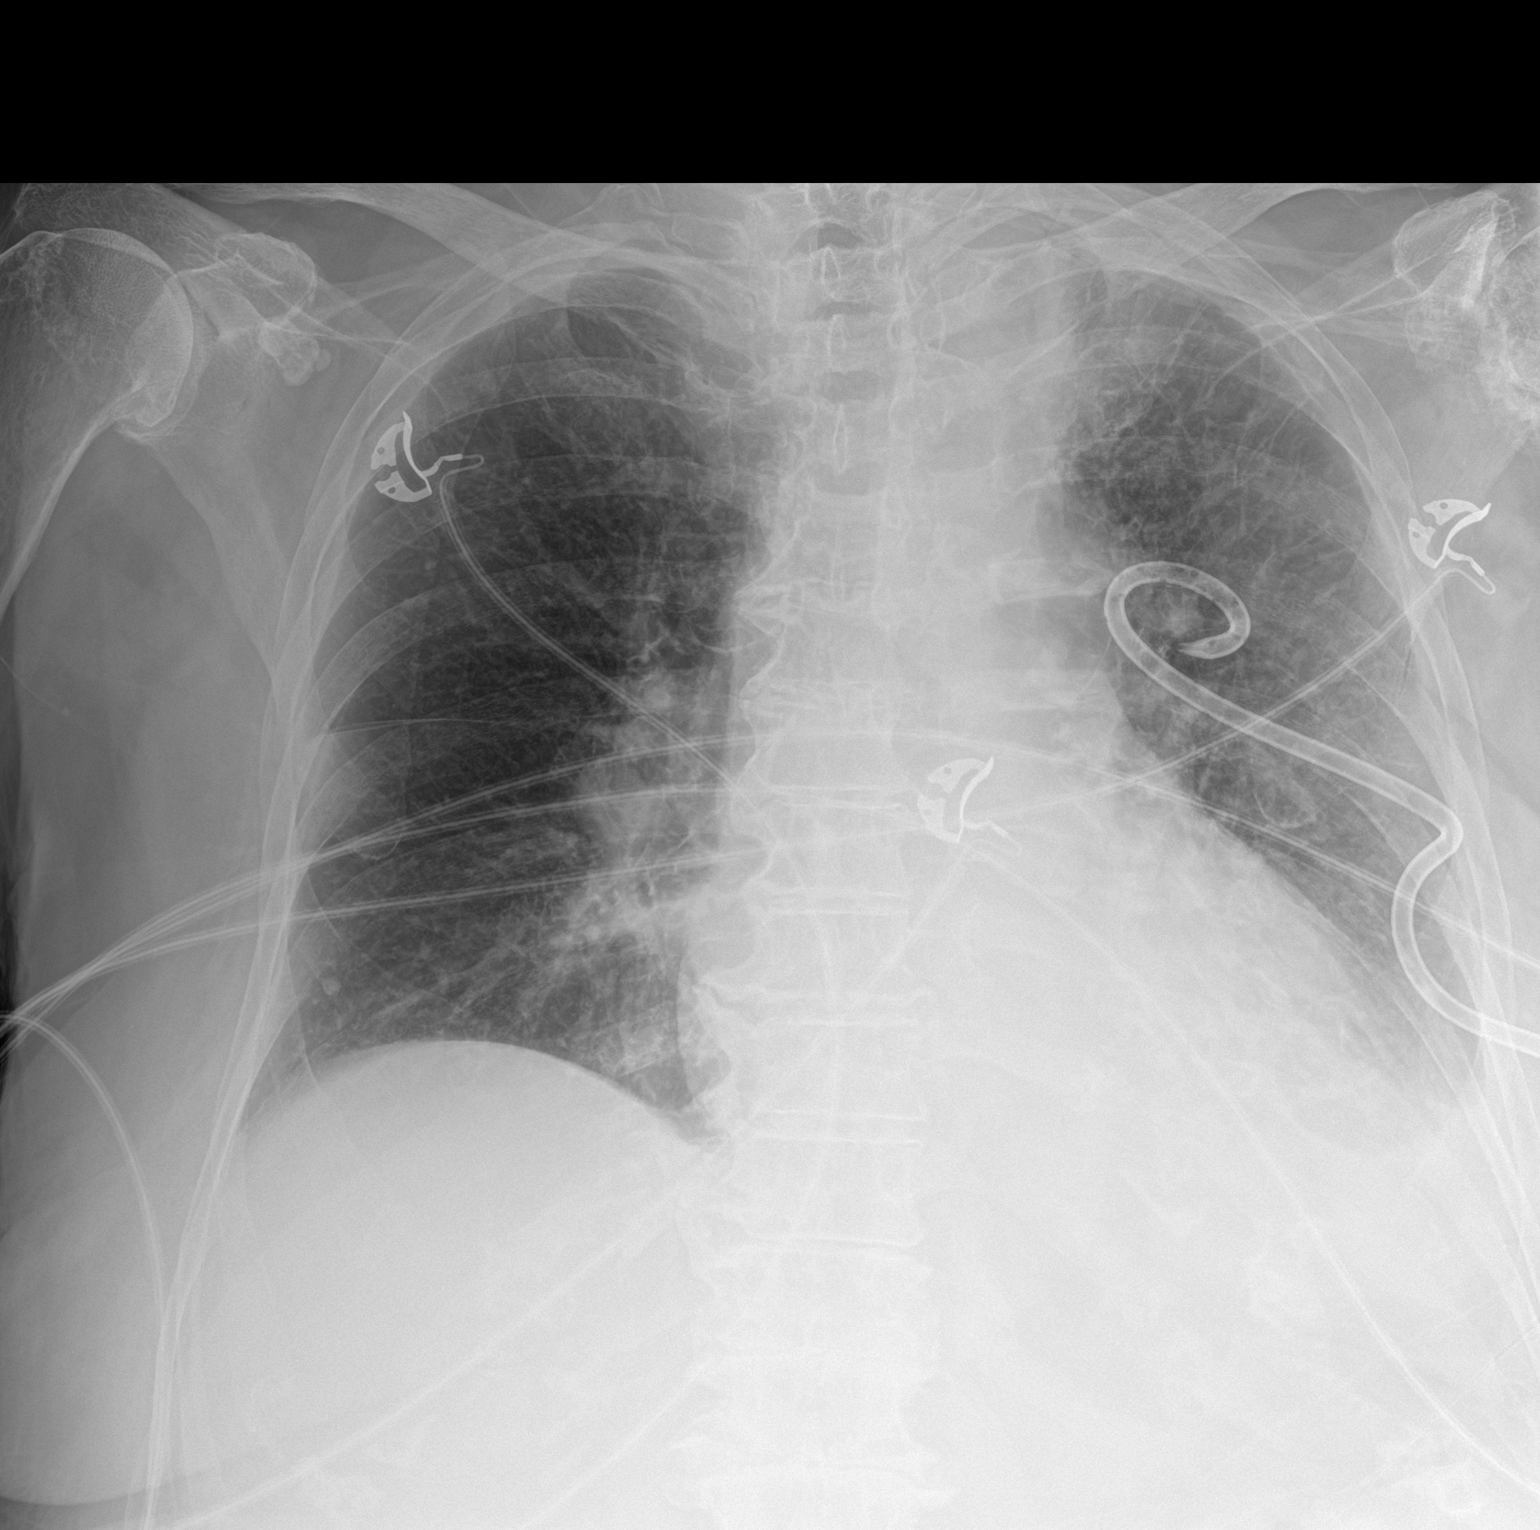

[1 of 1 positions shown; findings below may reference images not displayed]

FINDINGS: Left-sided chest tube is unchanged in position. Midline trachea.
Cardiomegaly accentuated by AP portable technique. Small left
pleural effusion is similar. No pneumothorax. No congestive failure.
Right lower lobe calcified granuloma. Similar left base airspace
disease. Advanced left greater than right glenohumeral joint
osteoarthritis.
IMPRESSION: No significant change since the prior exam.

Left-sided chest tube remaining in place, with similar left pleural
effusion but no pneumothorax.

Cardiomegaly with left base atelectasis.
